# Patient Record
Sex: Female | Born: 1940 | Race: White | Hispanic: No | Smoking: Never smoker
Health system: Southern US, Community
[De-identification: ages and names within clinical notes are randomized; demographics above are authoritative.]

## PROBLEM LIST (undated history)

## (undated) DIAGNOSIS — N63 Unspecified lump in unspecified breast: Secondary | ICD-10-CM

## (undated) DIAGNOSIS — G4730 Sleep apnea, unspecified: Secondary | ICD-10-CM

## (undated) DIAGNOSIS — Z5189 Encounter for other specified aftercare: Secondary | ICD-10-CM

## (undated) DIAGNOSIS — H269 Unspecified cataract: Secondary | ICD-10-CM

## (undated) DIAGNOSIS — E785 Hyperlipidemia, unspecified: Secondary | ICD-10-CM

## (undated) DIAGNOSIS — A389 Scarlet fever, uncomplicated: Secondary | ICD-10-CM

## (undated) DIAGNOSIS — Z79890 Hormone replacement therapy (postmenopausal): Secondary | ICD-10-CM

## (undated) DIAGNOSIS — T7840XA Allergy, unspecified, initial encounter: Secondary | ICD-10-CM

## (undated) DIAGNOSIS — C50919 Malignant neoplasm of unspecified site of unspecified female breast: Secondary | ICD-10-CM

## (undated) DIAGNOSIS — K759 Inflammatory liver disease, unspecified: Secondary | ICD-10-CM

## (undated) HISTORY — DX: Malignant neoplasm of unspecified site of unspecified female breast: C50.919

## (undated) HISTORY — PX: OTHER SURGICAL HISTORY: SHX169

## (undated) HISTORY — PX: POLYPECTOMY: SHX149

## (undated) HISTORY — PX: BUNIONECTOMY: SHX129

## (undated) HISTORY — DX: Hyperlipidemia, unspecified: E78.5

## (undated) HISTORY — DX: Encounter for other specified aftercare: Z51.89

## (undated) HISTORY — PX: TONSILLECTOMY: SHX5217

## (undated) HISTORY — PX: TONSILLECTOMY: SUR1361

## (undated) HISTORY — DX: Allergy, unspecified, initial encounter: T78.40XA

## (undated) HISTORY — DX: Scarlet fever, uncomplicated: A38.9

## (undated) HISTORY — DX: Sleep apnea, unspecified: G47.30

## (undated) HISTORY — DX: Unspecified lump in unspecified breast: N63.0

## (undated) HISTORY — PX: APPENDECTOMY: SHX54

## (undated) HISTORY — DX: Inflammatory liver disease, unspecified: K75.9

## (undated) HISTORY — DX: Unspecified cataract: H26.9

## (undated) HISTORY — PX: COLONOSCOPY: SHX174

## (undated) HISTORY — PX: CORRECTION HAMMER TOE: SUR317

## (undated) HISTORY — DX: Hormone replacement therapy: Z79.890

## (undated) HISTORY — PX: FOOT TENDON SURGERY: SHX958

---

## 1997-09-07 ENCOUNTER — Other Ambulatory Visit: Admission: RE | Admit: 1997-09-07 | Discharge: 1997-09-07 | Payer: Self-pay | Admitting: Gynecology

## 1998-10-08 ENCOUNTER — Other Ambulatory Visit: Admission: RE | Admit: 1998-10-08 | Discharge: 1998-10-08 | Payer: Self-pay | Admitting: Gynecology

## 1999-01-03 ENCOUNTER — Encounter (INDEPENDENT_AMBULATORY_CARE_PROVIDER_SITE_OTHER): Payer: Self-pay

## 1999-01-03 ENCOUNTER — Other Ambulatory Visit: Admission: RE | Admit: 1999-01-03 | Discharge: 1999-01-03 | Payer: Self-pay | Admitting: Gynecology

## 1999-08-31 ENCOUNTER — Other Ambulatory Visit: Admission: RE | Admit: 1999-08-31 | Discharge: 1999-08-31 | Payer: Self-pay | Admitting: Gynecology

## 2000-09-05 ENCOUNTER — Other Ambulatory Visit: Admission: RE | Admit: 2000-09-05 | Discharge: 2000-09-05 | Payer: Self-pay | Admitting: Gynecology

## 2001-12-26 ENCOUNTER — Other Ambulatory Visit: Admission: RE | Admit: 2001-12-26 | Discharge: 2001-12-26 | Payer: Self-pay | Admitting: Gynecology

## 2002-11-04 ENCOUNTER — Ambulatory Visit (HOSPITAL_BASED_OUTPATIENT_CLINIC_OR_DEPARTMENT_OTHER): Admission: RE | Admit: 2002-11-04 | Discharge: 2002-11-05 | Payer: Self-pay | Admitting: Orthopedic Surgery

## 2002-11-04 ENCOUNTER — Encounter (INDEPENDENT_AMBULATORY_CARE_PROVIDER_SITE_OTHER): Payer: Self-pay | Admitting: Specialist

## 2003-01-05 ENCOUNTER — Other Ambulatory Visit: Admission: RE | Admit: 2003-01-05 | Discharge: 2003-01-05 | Payer: Self-pay | Admitting: Gynecology

## 2004-01-20 ENCOUNTER — Other Ambulatory Visit: Admission: RE | Admit: 2004-01-20 | Discharge: 2004-01-20 | Payer: Self-pay | Admitting: Gynecology

## 2004-03-28 ENCOUNTER — Ambulatory Visit: Payer: Self-pay | Admitting: Internal Medicine

## 2004-04-12 ENCOUNTER — Ambulatory Visit: Payer: Self-pay | Admitting: Internal Medicine

## 2005-03-27 ENCOUNTER — Other Ambulatory Visit: Admission: RE | Admit: 2005-03-27 | Discharge: 2005-03-27 | Payer: Self-pay | Admitting: Gynecology

## 2005-04-10 ENCOUNTER — Ambulatory Visit: Payer: Self-pay | Admitting: Internal Medicine

## 2005-04-11 ENCOUNTER — Ambulatory Visit: Payer: Self-pay | Admitting: Internal Medicine

## 2005-04-15 ENCOUNTER — Ambulatory Visit: Payer: Self-pay | Admitting: Internal Medicine

## 2005-04-18 ENCOUNTER — Ambulatory Visit: Payer: Self-pay | Admitting: Internal Medicine

## 2005-06-15 ENCOUNTER — Ambulatory Visit: Payer: Self-pay | Admitting: Internal Medicine

## 2005-08-25 ENCOUNTER — Ambulatory Visit: Payer: Self-pay | Admitting: Internal Medicine

## 2005-10-05 ENCOUNTER — Ambulatory Visit: Payer: Self-pay | Admitting: Endocrinology

## 2006-03-13 ENCOUNTER — Other Ambulatory Visit: Admission: RE | Admit: 2006-03-13 | Discharge: 2006-03-13 | Payer: Self-pay | Admitting: Gynecology

## 2006-03-14 ENCOUNTER — Ambulatory Visit (HOSPITAL_COMMUNITY): Admission: RE | Admit: 2006-03-14 | Discharge: 2006-03-14 | Payer: Self-pay | Admitting: Endocrinology

## 2006-03-14 ENCOUNTER — Ambulatory Visit: Payer: Self-pay | Admitting: Endocrinology

## 2006-05-15 LAB — HM MAMMOGRAPHY

## 2006-05-21 ENCOUNTER — Ambulatory Visit: Payer: Self-pay | Admitting: Internal Medicine

## 2006-05-21 LAB — CONVERTED CEMR LAB
ALT: 22 units/L (ref 0–40)
AST: 19 units/L (ref 0–37)
Albumin: 4 g/dL (ref 3.5–5.2)
Alkaline Phosphatase: 47 units/L (ref 39–117)
Basophils Relative: 0.8 % (ref 0.0–1.0)
Chloride: 103 meq/L (ref 96–112)
Chol/HDL Ratio, serum: 5.2
GFR calc non Af Amer: 77 mL/min
Glomerular Filtration Rate, Af Am: 93 mL/min/{1.73_m2}
Glucose, Bld: 109 mg/dL — ABNORMAL HIGH (ref 70–99)
HDL: 39.5 mg/dL (ref >39.0)
LDL DIRECT: 121.1 mg/dL
MCV: 87.9 fL (ref 78.0–100.0)
Monocytes Absolute: 0.5 10*3/uL (ref 0.2–0.7)
Mucus, UA: NEGATIVE
Platelets: 415 10*3/uL — ABNORMAL HIGH (ref 150–400)
RBC: 4.7 M/uL (ref 3.87–5.11)
RDW: 11.8 % (ref 11.5–14.6)
Sodium: 141 meq/L (ref 135–145)
Specific Gravity, Urine: 1.02 (ref 1.000–1.03)
Total Bilirubin: 0.9 mg/dL (ref 0.3–1.2)
Total Protein, Urine: NEGATIVE mg/dL
Total Protein: 7.2 g/dL (ref 6.0–8.3)
Triglyceride fasting, serum: 278 mg/dL (ref 0–149)
WBC: 5.6 10*3/uL (ref 4.5–10.5)
pH: 7 (ref 5.0–8.0)

## 2007-02-13 ENCOUNTER — Ambulatory Visit: Payer: Self-pay | Admitting: Internal Medicine

## 2007-02-26 ENCOUNTER — Ambulatory Visit (HOSPITAL_COMMUNITY): Admission: RE | Admit: 2007-02-26 | Discharge: 2007-02-26 | Payer: Self-pay | Admitting: Internal Medicine

## 2007-02-26 ENCOUNTER — Ambulatory Visit: Payer: Self-pay | Admitting: Internal Medicine

## 2007-05-16 LAB — CONVERTED CEMR LAB

## 2007-06-10 ENCOUNTER — Encounter: Payer: Self-pay | Admitting: Internal Medicine

## 2007-06-10 DIAGNOSIS — Z9189 Other specified personal risk factors, not elsewhere classified: Secondary | ICD-10-CM | POA: Insufficient documentation

## 2007-06-10 DIAGNOSIS — E785 Hyperlipidemia, unspecified: Secondary | ICD-10-CM | POA: Insufficient documentation

## 2007-06-10 DIAGNOSIS — D126 Benign neoplasm of colon, unspecified: Secondary | ICD-10-CM | POA: Insufficient documentation

## 2007-09-04 ENCOUNTER — Ambulatory Visit: Payer: Self-pay | Admitting: Internal Medicine

## 2007-09-04 LAB — CONVERTED CEMR LAB
Albumin: 4 g/dL (ref 3.5–5.2)
Alkaline Phosphatase: 44 units/L (ref 39–117)
BUN: 15 mg/dL (ref 6–23)
Basophils Relative: 0.3 % (ref 0.0–1.0)
Bilirubin Urine: NEGATIVE
Calcium: 9.8 mg/dL (ref 8.4–10.5)
Crystals: NEGATIVE
Eosinophils Relative: 2 % (ref 0.0–5.0)
GFR calc Af Amer: 81 mL/min
Glucose, Bld: 114 mg/dL — ABNORMAL HIGH (ref 70–99)
HCT: 41.1 % (ref 36.0–46.0)
Hemoglobin: 14 g/dL (ref 12.0–15.0)
Monocytes Absolute: 0.5 10*3/uL (ref 0.1–1.0)
Monocytes Relative: 9.3 % (ref 3.0–12.0)
Neutro Abs: 3.1 10*3/uL (ref 1.4–7.7)
Nitrite: NEGATIVE
Potassium: 4.5 meq/L (ref 3.5–5.1)
Total Protein, Urine: NEGATIVE mg/dL
Total Protein: 7.2 g/dL (ref 6.0–8.3)
Triglycerides: 259 mg/dL (ref 0–149)
WBC: 5.4 10*3/uL (ref 4.5–10.5)
pH: 6.5 (ref 5.0–8.0)

## 2007-09-11 ENCOUNTER — Ambulatory Visit: Payer: Self-pay | Admitting: Internal Medicine

## 2007-09-11 DIAGNOSIS — E669 Obesity, unspecified: Secondary | ICD-10-CM

## 2007-09-11 HISTORY — DX: Obesity, unspecified: E66.9

## 2008-01-31 ENCOUNTER — Encounter: Payer: Self-pay | Admitting: Internal Medicine

## 2008-02-20 ENCOUNTER — Ambulatory Visit: Payer: Self-pay | Admitting: Internal Medicine

## 2008-03-26 ENCOUNTER — Encounter: Payer: Self-pay | Admitting: Internal Medicine

## 2008-04-29 ENCOUNTER — Encounter: Payer: Self-pay | Admitting: Internal Medicine

## 2008-07-22 ENCOUNTER — Encounter: Payer: Self-pay | Admitting: Internal Medicine

## 2008-07-26 ENCOUNTER — Encounter: Payer: Self-pay | Admitting: Internal Medicine

## 2008-07-29 ENCOUNTER — Encounter: Payer: Self-pay | Admitting: Internal Medicine

## 2008-09-22 ENCOUNTER — Ambulatory Visit: Payer: Self-pay | Admitting: Internal Medicine

## 2008-09-24 ENCOUNTER — Ambulatory Visit: Payer: Self-pay | Admitting: Internal Medicine

## 2008-09-24 DIAGNOSIS — T887XXA Unspecified adverse effect of drug or medicament, initial encounter: Secondary | ICD-10-CM | POA: Insufficient documentation

## 2008-09-24 LAB — CONVERTED CEMR LAB
ALT: 19 units/L (ref 0–35)
AST: 17 units/L (ref 0–37)
Alkaline Phosphatase: 43 units/L (ref 39–117)
BUN: 15 mg/dL (ref 6–23)
Basophils Absolute: 0.1 10*3/uL (ref 0.0–0.1)
Bilirubin, Direct: 0.1 mg/dL (ref 0.0–0.3)
Chloride: 103 meq/L (ref 96–112)
Direct LDL: 130.3 mg/dL
Eosinophils Absolute: 0.1 10*3/uL (ref 0.0–0.7)
Lymphs Abs: 1.8 10*3/uL (ref 0.7–4.0)
MCHC: 34.7 g/dL (ref 30.0–36.0)
MCV: 87.6 fL (ref 78.0–100.0)
Monocytes Absolute: 0.6 10*3/uL (ref 0.1–1.0)
Neutrophils Relative %: 53.6 % (ref 43.0–77.0)
Platelets: 333 10*3/uL (ref 150.0–400.0)
Potassium: 4.1 meq/L (ref 3.5–5.1)
RDW: 12.2 % (ref 11.5–14.6)
Sodium: 140 meq/L (ref 135–145)
TSH: 1.66 microintl units/mL (ref 0.35–5.50)
Total Bilirubin: 0.8 mg/dL (ref 0.3–1.2)
VLDL: 54.2 mg/dL — ABNORMAL HIGH (ref 0.0–40.0)
WBC: 5.6 10*3/uL (ref 4.5–10.5)

## 2008-10-12 ENCOUNTER — Encounter: Payer: Self-pay | Admitting: Internal Medicine

## 2009-03-30 ENCOUNTER — Encounter: Payer: Self-pay | Admitting: Internal Medicine

## 2009-04-02 ENCOUNTER — Ambulatory Visit: Payer: Self-pay | Admitting: Internal Medicine

## 2009-06-15 ENCOUNTER — Encounter: Payer: Self-pay | Admitting: Internal Medicine

## 2009-08-10 ENCOUNTER — Telehealth: Payer: Self-pay | Admitting: Internal Medicine

## 2009-09-23 ENCOUNTER — Ambulatory Visit: Payer: Self-pay | Admitting: Internal Medicine

## 2009-09-23 LAB — CONVERTED CEMR LAB
ALT: 23 units/L (ref 0–35)
Alkaline Phosphatase: 50 units/L (ref 39–117)
BUN: 20 mg/dL (ref 6–23)
Basophils Absolute: 0 10*3/uL (ref 0.0–0.1)
Bilirubin, Direct: 0.1 mg/dL (ref 0.0–0.3)
Chloride: 101 meq/L (ref 96–112)
Creatinine, Ser: 0.7 mg/dL (ref 0.4–1.2)
Direct LDL: 150.4 mg/dL
Eosinophils Relative: 1.9 % (ref 0.0–5.0)
GFR calc non Af Amer: 93.3 mL/min (ref >60)
Glucose, Bld: 86 mg/dL (ref 70–99)
HCT: 40.2 % (ref 36.0–46.0)
Lymphs Abs: 2.4 10*3/uL (ref 0.7–4.0)
MCV: 87.9 fL (ref 78.0–100.0)
Monocytes Absolute: 0.7 10*3/uL (ref 0.1–1.0)
Neutrophils Relative %: 52.6 % (ref 43.0–77.0)
Platelets: 370 10*3/uL (ref 150.0–400.0)
RDW: 12.6 % (ref 11.5–14.6)
TSH: 0.97 microintl units/mL (ref 0.35–5.50)
Total Bilirubin: 0.6 mg/dL (ref 0.3–1.2)
Total Protein: 7.3 g/dL (ref 6.0–8.3)
WBC: 6.9 10*3/uL (ref 4.5–10.5)

## 2009-10-01 ENCOUNTER — Telehealth: Payer: Self-pay | Admitting: Internal Medicine

## 2010-03-31 ENCOUNTER — Encounter: Payer: Self-pay | Admitting: Internal Medicine

## 2010-05-26 ENCOUNTER — Telehealth: Payer: Self-pay | Admitting: Internal Medicine

## 2010-05-27 ENCOUNTER — Ambulatory Visit
Admission: RE | Admit: 2010-05-27 | Discharge: 2010-05-27 | Payer: Self-pay | Source: Home / Self Care | Attending: Internal Medicine | Admitting: Internal Medicine

## 2010-06-16 ENCOUNTER — Encounter: Payer: Self-pay | Admitting: Internal Medicine

## 2010-06-16 NOTE — Progress Notes (Signed)
Summary: ZOSTAVAX  Phone Note Call from Patient Call back at [phone]   Summary of Call: Pt left vm req rx's for pneumovax and zostavax. Will have manager check coverage of zostavax.  Initial call taken by: Lamar Sprinkles, CMA,  May 26, 2010 12:24 PM  Follow-up for Phone Call        Pt's cost is 40$, she is comming in today for shingles & pneumonia vaccines Follow-up by: Lamar Sprinkles, CMA,  May 27, 2010 2:24 PM

## 2010-06-16 NOTE — Assessment & Plan Note (Signed)
Summary: CPX / MEDICARE / # / CD   Vital Signs:  Patient profile:   70 year old female Height:      67 inches Weight:      207 pounds BMI:     32.54 O2 Sat:      94 % on Room air Temp:     97.7 degrees F oral Pulse rate:   67 / minute BP sitting:   100 / 78  (left arm) Cuff size:   large  Vitals Entered By: Bill Salinas CMA (Sep 25, 2009 2:54 PM)  O2 Flow:  Room air CC: pt here for cpx, she has never had pneumonia or shingles vaccine/ ab  Vision Screening:      Vision Comments: Pt's last eye exam was about oct 2010 with normal exam.   Vision Entered By: Bill Salinas CMA (09/25/09 2:56 PM)   Primary Care Provider:  Jamise Pentland  CC:  pt here for cpx and she has never had pneumonia or shingles vaccine/ ab.  History of Present Illness: Patient presents for routine medical exam.  For the past 9 months- she will have sore and stiff PIP joints both hands x 15-20 minutes in AM.  S/P operative repair/tendon reconstruction left lateral ankle. With active weight bearing activity she will have pain and pain. It is oK to exercise but start slow and build up   New growth on the lateral thigh right.   Broken/split nails.   Question about sleep and TV.   Current Medications (verified): 1)  Lipitor 20 Mg  Tabs (Atorvastatin Calcium) .... Take 1 Tablet By Mouth Once A Day 2)  Vivelle-Dot 0.025 Mg/24hr  Pttw (Estradiol) .... Take 1 By Mouth Qd 3)  Calcium 600 Mg  Tabs (Calcium) .... Take 1 Tablet By Mouth Once A Day 4)  Multivitamins   Tabs (Multiple Vitamin) .... Take 1 Tablet By Mouth Once A Day 5)  Prometrium .... Every Four Months For 12 Days 6)  Vitamin D 26-Sep-1998 Unit Tabs (Cholecalciferol) .... Take 1 Tablet By Mouth Once A Day  Allergies (verified): 1)  ! 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  Risk Factors: Alcohol Use: 1 (09/22/2008) Caffeine Use: 2 (09/11/2007) Exercise: no (09/22/2008)  Risk Factors: Smoking Status: never (09/22/2008)  Review of Systems       The patient complains of weight gain.  The patient denies anorexia, fever, weight loss, vision loss, decreased hearing, chest pain, syncope, dyspnea on exertion, prolonged cough, headaches, abdominal pain, severe indigestion/heartburn, incontinence, suspicious skin lesions, difficulty walking, depression, abnormal bleeding, angioedema, and breast masses.    Physical Exam  General:  Heavy set white female in no distress Head:  Normocephalic and atraumatic without obvious abnormalities. No apparent alopecia or balding. Eyes:  No corneal or conjunctival inflammation noted. EOMI. Perrla. Funduscopic exam benign, without hemorrhages, exudates or papilledema. Vision grossly normal. Ears:  External ear exam shows no significant lesions or deformities.  Otoscopic examination reveals clear canals, tympanic membranes  are intact bilaterally without bulging, retraction, inflammation or discharge. Hearing is grossly normal bilaterally. Nose:  no external deformity and no external erythema.   Mouth:  Oral mucosa and oropharynx without lesions or exudates.  Teeth in good repair. Neck:  supple, full ROM, no thyromegaly, and no carotid bruits.   Chest Wall:  no deformities.   Breasts:  deferred to gyn Lungs:  Normal respiratory effort, chest expands  symmetrically. Lungs are clear to auscultation, no crackles or wheezes. Heart:  Normal rate and regular rhythm. S1 and S2 normal without gallop, murmur, click, rub or other extra sounds. Abdomen:  soft, non-tender, normal bowel sounds, no masses, no guarding, and no hepatomegaly.   Genitalia:  deferred to gyn Msk:  normal ROM, no joint tenderness, no joint warmth, no redness over joints, and no joint instability.   Pulses:  2+ radial and PT Extremities:  No clubbing, cyanosis, edema, or deformity noted with normal full range of motion of all joints.   Neurologic:  alert & oriented X3, cranial nerves II-XII intact, strength normal in all extremities, sensation intact to pinprick, gait normal, and DTRs symmetrical and normal.   Skin:  turgor normal, color normal, no rashes, and no ulcerations.   Cervical Nodes:  no anterior cervical adenopathy and no posterior cervical adenopathy.   Psych:  Oriented X3, memory intact for recent and remote, normally interactive, good eye contact, and not anxious appearing.     Impression & Recommendations:  Problem # 1:  OVERWEIGHT (ICD-278.02) Patient wil be working with weight watchers on weight management. she is encouraged to increase exercise and water exercise is recommended  Problem # 2:  HYPERLIPIDEMIA (ICD-272.4)  Her updated medication list for this problem includes:    Lipitor 40 Mg Tabs (Atorvastatin calcium) .Marland Kitchen... 1 by mouth two times a day  Orders: TLB-Lipid Panel (80061-LIPID) TLB-Hepatic/Liver Function Pnl (80076-HEPATIC) TLB-TSH (Thyroid Stimulating Hormone) (84443-TSH)  Addendum - LDL 150.4 with a goal of 130 or less.  Plan - will increase Lipitor to 40mg  once daily.           With 20 lb weight loss will readjust lipitor dose.  Problem # 3:  Preventive Health Care (ICD-V70.0) Current with Gyn. Normal history as outlined in HPI. Normal limited exam. Labs except for elevated cholesterol levels look fine. Patient remains active and has a  plan for weight loss and increased exercise.  In summary - a very nice woman who appears medically stable.   Complete Medication List: 1)  Lipitor 40 Mg Tabs (Atorvastatin calcium) .Marland Kitchen.. 1 by mouth two times a day 2)  Vivelle-dot 0.025 Mg/24hr Pttw (Estradiol) .... Take 1 by mouth qd 3)  Calcium 600 Mg Tabs (Calcium) .... Take 1 tablet by mouth once a day 4)  Multivitamins Tabs (Multiple vitamin) .... Take 1 tablet by mouth once a day 5)  Prometrium  .... Every four months for 12 days 6)  Vitamin D 2000 Unit Tabs (Cholecalciferol) .... Take 1 tablet by mouth once a day  Other Orders: TLB-BMP (Basic Metabolic Panel-BMET) (80048-METABOL) TLB-CBC Platelet - w/Differential (85025-CBCD)  Patient: Kelly [name] Note: All result statuses are Final unless otherwise noted.  Tests: (1) Lipid Panel (LIPID)   Cholesterol          [H]  249 mg/dL                   0-454     ATP III Classification  Desirable:  < 200 mg/dL                    Borderline High:  200 - 239 mg/dL               High:  > = 240 mg/dL   Triglycerides        [H]  448.0 mg/dL                 1.6-109.6     Normal:  <150 mg/dL     Borderline High:  045 - 199 mg/dL   HDL                       40.98 mg/dL                 >11.91   VLDL Cholesterol     [H]  89.6 mg/dL                  4.7-82.9  CHO/HDL Ratio:  CHD Risk                             6                    Men          Women     1/2 Average Risk     3.4          3.3     Average Risk          5.0          4.4     2X Average Risk          9.6          7.1     3X Average Risk          15.0          11.0                           Tests: (2) Hepatic/Liver Function Panel (HEPATIC)   Total Bilirubin           0.6 mg/dL                   5.6-2.1   Direct Bilirubin          0.1 mg/dL                   3.0-8.6   Alkaline Phosphatase      50 U/L                      39-117   AST                       20 U/L                      0-37   ALT                        23 U/L                      0-35   Total Protein             7.3 g/dL  6.0-8.3   Albumin                   4.3 g/dL                    1.6-1.0  Tests: (3) BMP (METABOL)   Sodium                    141 mEq/L                   135-145   Potassium                 4.4 mEq/L                   3.5-5.1   Chloride                  101 mEq/L                   96-112   Carbon Dioxide            30 mEq/L                    19-32   Glucose                   86 mg/dL                    96-04   BUN                       20 mg/dL                    5-40   Creatinine                0.7 mg/dL                   9.8-1.1   Calcium                   9.6 mg/dL                   9.1-47.8   GFR                       93.30 mL/min                >60  Tests: (4) CBC Platelet w/Diff (CBCD)   White Cell Count          6.9 K/uL                    4.5-10.5   Red Cell Count            4.58 Mil/uL                 3.87-5.11   Hemoglobin                14.0 g/dL                   29.5-62.1   Hematocrit                40.2 %                      36.0-46.0   MCV  87.9 fl                     78.0-100.0   MCHC                      34.8 g/dL                   16.1-09.6   RDW                       12.6 %                      11.5-14.6   Platelet Count            370.0 K/uL                  150.0-400.0   Neutrophil %              52.6 %                      43.0-77.0   Lymphocyte %              35.2 %                      12.0-46.0   Monocyte %                9.6 %                       3.0-12.0   Eosinophils%              1.9 %                       0.0-5.0   Basophils %               0.7 %                       0.0-3.0   Neutrophill Absolute      3.6 K/uL                    1.4-7.7   Lymphocyte Absolute       2.4 K/uL                    0.7-4.0   Monocyte Absolute         0.7 K/uL                    0.1-1.0  Eosinophils, Absolute                             0.1 K/uL                     0.0-0.7   Basophils Absolute        0.0 K/uL                    0.0-0.1  Tests: (5) TSH (TSH)   FastTSH                   0.97 uIU/mL                 0.35-5.50  Tests: (6) Cholesterol LDL - Direct (DIRLDL)  Cholesterol LDL - Direct                             150.4 mg/dL     Optimal:  <956 mg/dL     Near or Above Optimal:  100-129 mg/dL     Borderline High:  213-086 mg/dL     High:  578-469 mg/dL     Very High:  >629 mg/dL Prescriptions: LIPITOR 40 MG TABS (ATORVASTATIN CALCIUM) 1 by mouth two times a day  #30 x 12   Entered and Authorized by:   Jacques Navy MD   Signed by:   Jacques Navy MD on 09/24/2009   Method used:   Print then Give to Patient   RxID:   [id number] LIPITOR 20 MG  TABS (ATORVASTATIN CALCIUM) Take 1 tablet by mouth once a day  #30 Tablet x 12   Entered and Authorized by:   Jacques Navy MD   Signed by:   Jacques Navy MD on 09/23/2009   Method used:   Print then Give to Patient   RxID:   [id number]

## 2010-06-16 NOTE — Assessment & Plan Note (Signed)
Summary: apt at 4pm/pay 40$/shingles and pneumovax/SD   Nurse Visit  Comments RAN Zostavax thru [facility] - Cost to pt was 40$ - pt paid ....Marland KitchenMarland KitchenLamar Sprinkles, CMA  May 31, 2010 5:44 PM    Allergies: 1)  ! Codeine  Immunizations Administered:  Zostavax # 1:    Vaccine Type: Zostavax    Site: Left ARM    Mfr: Merck    Dose: 0.5 ml    Route: IM    Given by: Lamar Sprinkles, CMA    Exp. Date: 0/18/2012    Lot #: [id number]    VIS given: 02/24/05 given May 31, 2010.  Pneumonia Vaccine:    Vaccine Type: Pneumovax    Site: right deltoid    Mfr: Merck    Dose: 0.5 ml    Route: IM    Given by: Lamar Sprinkles, CMA    Exp. Date: 10/07/2011    Lot #: [id number]    VIS given: 04/19/09 version given May 31, 2010.  Orders Added: 1)  Pneumococcal Vaccine [90732] 2)  Admin of Any Addtl Vaccine [04540]

## 2010-06-16 NOTE — Progress Notes (Signed)
Summary: Lipitor  Phone Note Outgoing Call Call back at [phone]   Call placed by: Ami Bullins CMA,  Oct 01, 2009 2:40 PM Call placed to: Patient Summary of Call: pt called in reg. to lipitor. Per last office visit pt was increased to 40mg  of lipitor. She should have rx Dr Debby Bud printed out. Waiting for pt to call back to clarify this with her. Initial call taken by: Ami Bullins CMA,  Oct 01, 2009 2:40 PM  Follow-up for Phone Call        Pt was previously on Lipitor 20mg  once daily. She was not fasting for last labs. Pt's rx was changed to lipitor 40mg  two times a day and she is concerned about signifigant increase. Please advise.  Follow-up by: Lamar Sprinkles, CMA,  Oct 06, 2009 11:00 AM  Additional Follow-up for Phone Call Additional follow up Details #1::        my error - should have been 40mg  once a day.  Thanks Additional Follow-up by: Jacques Navy MD,  Oct 07, 2009 1:11 PM    Additional Follow-up for Phone Call Additional follow up Details #2::    Left vm for pt on cell Follow-up by: Lamar Sprinkles, CMA,  Oct 07, 2009 3:30 PM  New/Updated Medications: LIPITOR 40 MG TABS (ATORVASTATIN CALCIUM) 1 once daily

## 2010-06-16 NOTE — Letter (Signed)
Summary: Beather Arbour MD  Beather Arbour MD   Imported By: Sherian Rein 04/13/2010 08:54:25  _____________________________________________________________________  External Attachment:    Type:   Image     Comment:   External Document

## 2010-06-16 NOTE — Progress Notes (Signed)
Summary: REFILL  Phone Note Call from Patient Call back at [phone]   Summary of Call: Patient is requesting rx for lipitor before cpx in May Initial call taken by: Lamar Sprinkles, CMA,  August 10, 2009 8:52 AM  Follow-up for Phone Call        Spoke w/pt, her insurance changed and will call office back with info about pharm Follow-up by: Lamar Sprinkles, CMA,  August 10, 2009 5:13 PM

## 2010-07-18 ENCOUNTER — Telehealth: Payer: Self-pay | Admitting: Internal Medicine

## 2010-07-21 ENCOUNTER — Telehealth (INDEPENDENT_AMBULATORY_CARE_PROVIDER_SITE_OTHER): Payer: Self-pay | Admitting: *Deleted

## 2010-07-22 ENCOUNTER — Other Ambulatory Visit: Payer: Self-pay

## 2010-07-22 ENCOUNTER — Encounter (INDEPENDENT_AMBULATORY_CARE_PROVIDER_SITE_OTHER): Payer: Self-pay | Admitting: *Deleted

## 2010-07-22 ENCOUNTER — Other Ambulatory Visit: Payer: Self-pay | Admitting: Internal Medicine

## 2010-07-22 DIAGNOSIS — E785 Hyperlipidemia, unspecified: Secondary | ICD-10-CM

## 2010-07-22 DIAGNOSIS — T887XXA Unspecified adverse effect of drug or medicament, initial encounter: Secondary | ICD-10-CM

## 2010-07-22 LAB — HEPATIC FUNCTION PANEL
ALT: 19 U/L (ref 0–35)
AST: 16 U/L (ref 0–37)
Alkaline Phosphatase: 39 U/L (ref 39–117)
Bilirubin, Direct: 0.1 mg/dL (ref 0.0–0.3)
Total Bilirubin: 0.7 mg/dL (ref 0.3–1.2)
Total Protein: 6.9 g/dL (ref 6.0–8.3)

## 2010-07-22 LAB — LIPID PANEL: HDL: 40.5 mg/dL (ref >39.00)

## 2010-07-24 ENCOUNTER — Encounter: Payer: Self-pay | Admitting: Internal Medicine

## 2010-07-26 NOTE — Progress Notes (Signed)
Summary: CHOLESTEROL   Phone Note Call from Patient Call back at [phone]   Summary of Call: Patient is requesting labs to recheck lipids. She says she has lost weight and does not feel she needs the high dose she is on now. Pt also has learned there is now a connection between cholesterol meds and dementia. OK for labs or does pt need office visit also?  Initial call taken by: Lamar Sprinkles, CMA,  July 18, 2010 2:48 PM  Follow-up for Phone Call        ok for lipid panel 272.4, hepatic panel 995.20. Can adjust meds based on labs. Connection to dementia is tenuous and is outweight by the risk of death from atherosclerosis and coronary artery disease.  Follow-up by: Jacques Navy MD,  July 18, 2010 2:57 PM  Additional Follow-up for Phone Call Additional follow up Details #1::        left mess to call office back.............Marland KitchenLamar Sprinkles, CMA  July 19, 2010 5:55 PM   Pt informed, she will come in for labs Friday am Additional Follow-up by: Lamar Sprinkles, CMA,  July 20, 2010 11:26 AM

## 2010-07-26 NOTE — Progress Notes (Signed)
----   Converted from flag ---- ---- 07/20/2010 11:28 AM, Lamar Sprinkles, CMA wrote: lipid panel 272.4, hepatic panel 995.20  Please put labs in Friday AM  THANKS ------------------------------ Labs entered-- You are welcome

## 2010-07-29 ENCOUNTER — Telehealth: Payer: Self-pay | Admitting: Internal Medicine

## 2010-08-02 NOTE — Progress Notes (Signed)
Summary: Rx inquiry  Phone Note Call from Patient Call back at Home Phone [phone]   Caller: Patient [phone] Summary of Call: Pt inquiring about her Lipitor Rx. Pt states that Lipitor went generic (atorvastatin) in December 2011 according to [facility] and it is costing her a difference of ($4) for generic to ($40) for brand name. Pt states that [facility] faxed form for approval of generic - not received.?  LMOM (per HIPAA) to inform Pt that Rxs sent that are not written DAW (dispense as written) which is not often and have a generic form are dispensed as generic. Informed Pt to call back if she needs [place] to call a Rx for med in to her pharmacy. Initial call taken by: Burnard Leigh [facility]),  July 29, 2010 2:24 PM

## 2010-08-02 NOTE — Letter (Signed)
[facility] [address] [place], [place]  [address] Phone: [phone]      July 25, 2010   [facility] [address] [place], [place] [address]  RE:  LAB RESULTS  Dear  [name],  The following is an interpretation of your most recent lab tests.  Please take note of any instructions provided or changes to medications that have resulted from your lab work.  LIVER FUNCTION TESTS:  Good - no changes needed  Health professionals look at cholesterol as more involved than just the total cholesterol. We consider the level of LDL (bad) cholesterol, HDL (good), cholesterol, and Triglycerides (Grease) in the blood.  1. Your LDL should be under 100, and the HDL should be over 45, if you have any vascular disease such as heart attack, angina, stroke, TIA (mini stroke), claudication (pain in the legs when you walk due to poor circulation),  Abdominal Aortic Aneurysm (AAA), diabetes or prediabetes.  2. Your LDL should be under 130 if you have any two of the following:     a. Smoke or chew tobacco,     b. High blood pressure (if you are on medication or over 140/90 without medication),     c. Female gender,    d. HDL below 40,    e. A female relative (father, brother, or son), who have had any vascular event          as described in #1. above under the age of 70, or a female relative (mother,       sister, or daughter) who had an event as described above under age 26. (An HDL over 60 will subtract one risk factor from the total, so if you have two items in # 2 above, but an HDL over 60, you then fall into category # 3 below).  3. Your LDL should be under 160 if you have any one of the above.  Triglycerides should be under 200 with the ideal being under 150.  For diabetes or pre-diabetes, the ideal HgbA1C should be under 6.0%.  If you fall into any of the above categories, you should make a follow up appointment to discuss this with your physician.  LIPID PANEL:   Good - no changes needed Triglyceride: 212.0   Cholesterol: 194   LDL: DEL   HDL: 40.50   Chol/HDL%:  5    Excellent job of bringing cholesterol down. Continue preent dose of medications.  Please come see me if you have any questions about these lab results.   Sincerely Yours,    Jacques Navy MD  Patient: Kelly [name] Note: All result statuses are Final unless otherwise noted.  Tests: (1) Lipid Panel (LIPID)   Cholesterol               194 mg/dL                   3-086     ATP III Classification            Desirable:  < 200 mg/dL                    Borderline High:  200 - 239 mg/dL               High:  > = 240 mg/dL   Triglycerides        [H]  212.0 mg/dL  0.0-149.0     Normal:  <150 mg/dL     Borderline High:  409 - 199 mg/dL   HDL                       81.19 mg/dL                 >14.78   VLDL Cholesterol     [H]  42.4 mg/dL                  2.9-56.2  CHO/HDL Ratio:  CHD Risk                             5                    Men          Women     1/2 Average Risk     3.4          3.3     Average Risk          5.0          4.4     2X Average Risk          9.6          7.1     3X Average Risk          15.0          11.0                           Tests: (2) Hepatic/Liver Function Panel (HEPATIC)   Total Bilirubin           0.7 mg/dL                   1.3-0.8   Direct Bilirubin          0.1 mg/dL                   6.5-7.8   Alkaline Phosphatase      39 U/L                      39-117   AST                       16 U/L                      0-37   ALT                       19 U/L                      0-35   Total Protein             6.9 g/dL                    4.6-9.6   Albumin                   4.3 g/dL                    2.9-5.2  Tests: (3) Cholesterol LDL - Direct (DIRLDL)  Cholesterol LDL - Direct  112.6 mg/dL

## 2010-09-27 ENCOUNTER — Other Ambulatory Visit: Payer: Self-pay | Admitting: Internal Medicine

## 2010-09-30 NOTE — Op Note (Signed)
NAME:  STARASIA, SINKO                       ACCOUNT NO.:  [id number]   MEDICAL RECORD NO.:  [id number]                   PATIENT TYPE:  AMB   LOCATION:  DSC                                  FACILITY:  [facility]   PHYSICIAN:  Leonides Grills, M.D.                  DATE OF BIRTH:  1941-01-24   DATE OF PROCEDURE:  11/04/2002  DATE OF DISCHARGE:                                 OPERATIVE REPORT   PREOPERATIVE DIAGNOSIS:  Left peroneus brevis tear.  Left subluxing peroneal  tendon.  Benign deep soft tissue lesion of ankle.   POSTOPERATIVE DIAGNOSIS:  Left peroneus brevis tear.  Left subluxing  peroneal tendon.  Benign deep soft tissue lesion of ankle.  Left calcaneal  spur.   PROCEDURE:  Left excision deep benign soft tissue lesion of ankle.  Left  excision with local tenosynovectomy of peroneus brevis tendon.  Repair of  subluxing peroneal tendons with fibular osteotomy.  Excision calcaneal spur.  Left peroneus brevis to peroneus longus transfer.  Left peroneus longus to  peroneus brevis tenodesis.   ANESTHESIA:  General endotracheal tube.   SURGEON:  Leonides Grills, M.D.   ASSISTANT:  Lianne Cure, P.A.   ESTIMATED BLOOD LOSS:  Minimal.   TOURNIQUET TIME:  Approximately 1 hour and 15 minutes.   COMPLICATIONS:  None.   DISPOSITION:  Stable to the PR.   INDICATIONS FOR PROCEDURE:  This is a 70 year old female who has had  longstanding posterolateral ankle pain as well as a lesion that was  progressively getting larger over a long period of time on the anterolateral  aspect of her ankle. She was initially seen by Romana Juniper, M.D. 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  At this point, it was and she wished to have it  removed along with repair of her peroneus brevis tear as well as excision of  the synovitis in the area.  She was consented for the above procedure.  All  risks which include infection, nerve  or vessel injury, persistent pain,  worsening of pain, weakness, stiffness, recurrence of the soft tissue lesion  were all explained. Questions were encouraged and answered.   DESCRIPTION OF PROCEDURE:  The patient was brought to the operating room and  placed in the supine position. After adequate general endotracheal tube  anesthesia was administered as well as Ancef 1 gram IV piggyback, the  patient was then placed in a floppy lateral position with a bump placed  under the left ipsilateral hip and the left lower extremity was prepped and  draped in the usual sterile fashion over a proximally placed thigh  tourniquet.  The limb was gravity exsanguinated. The tourniquet was elevated  to 290 mmHg.  A curvilinear incision midline over the lateral malleolus  extending distally approximately 3 to 4 cm was then  made. Dissection was  carried down through skin. We then dissected anteriorly and encountered the  adipose lipoma type tissue. This was then meticulously removed with a pickup  and scissors and sent to pathology. It was about a golfball size and was  adipose tissue in consistency. Once this was removed, hemostasis was  obtained.  We then dissected posteriorly and encountered the peroneal  retinaculum. A longitudinal incision was then made 1 to 2 mm posterior to  the edge of the lateral malleolus. Once this was released, there was a large  amount of synovitis as well as effusion in this area. The tendons were then  debrided of the synovitis.  The peroneus brevis muscle belly extended all  the way to the tip of the lateral malleolus and needed to be debulked  proximally as well to about 4 to 5 cm proximal to this area.  There was a  large approximately 50% tear of the peroneus brevis tendon. Due to the fact  that it was also bulbous just distal to the lateral malleolus and once it  was placed back into its respective canal, it was deemed that this would be  not viable and would be a  source of pain if this was repaired and would most  likely cause a trigger-type phenomenon once the retinaculum was repaired.  This was then excised.  We also inspected the posterior aspect of the  lateral malleolus. It was convex in its surface. We then did an osteotomy  and burring to deepen the groove. Once the flap was elevated and deepening  of the groove was then performed, the bed posterior to the lateral malleolus  was then tamped into place. This had excellent repair and deepening of the  peroneal groove.  We then prepared the bed for the advancement of the  retinaculum on the posterolateral corner of the fibula and placed two 5.0 mm  absorbable corkscrew suture anchors with #2 Fiberwire.  Once this was placed  in a proper position, we then left the suture for later repair.  Once the  peroneus brevis tendon portion was excised, we then performed a proximally  peroneus brevis to longus transfer with #2 Fiberwire and then a peroneus  longus to brevis tenodesis distally with a #2 Fiberwire as well. This had  excellent repair.  We also found that there was an extremely large peroneal  tubercle off the lateral wall of the calcaneus. This was then removed with a  curved 1/4 inch osteotome and rounded off smooth with rongeur, ie, calcaneal  spur excision.  Once this was done, we then repaired the retinaculum with a  #2 Fiberwire using suture anchors. This had excellent repair. We then also  repaired the main portion of the retinaculum with 2-0 Fiberwire. A Glorious Peach was  placed in the canal to prevent inadvertant suturing of the tendon.  The  ankle was then ranged and had excellent excursion within the canal.  The  wound was copiously irrigated with normal saline.  Once this was done in  layers. Tourniquet was deflated and hemostasis was obtained.  Subcutaneous  was closed with 3-0 Vicryl, skin was closed with 4-0 nylon, a sterile  dressing was applied. The patient was stable to the  PR.  Leonides Grills, M.D.    PB/MEDQ  D:  11/04/2002  T:  11/04/2002  Job:  [id number]

## 2010-09-30 NOTE — Assessment & Plan Note (Signed)
[facility]                           PRIMARY CARE OFFICE NOTE   NAME:[name], Kelly MENARD                    MRN:          [id number]  DATE:05/21/2006                            DOB:          [birth year]    Kelly [name] is a 70 year old Caucasian woman who presents for annual  physical exam. She was last seen in the office March 14, 2006 by Dr.  Everardo All for ache and bruising of left arm and knee after a fall.  Fortunately she had no fractures. She did have x-rays of both wrists and  lumbar spine which were negative. The patient also in October had a  chest x-ray which showed no rib fractures and normal lungs. Other visits  this year in 2007 included sore throat in May of 2007. The patient was  seen again for sore throat August 25, 2005.   CHIEF COMPLAINT:  The patient reports she is feeling well and doing well  with no active complaints at this time.   PAST MEDICAL HISTORY:  Family history and social history are well  documented in my note March 19, 2005 which was reviewed with no  additions, corrections, or updates.   HEALTH MAINTENANCE:  The patient has seen Dr. Nicholas Lose for general physical  exam, pelvic and PAP smear with a note on the chart from March 13, 2006. She had a normal examination.   CURRENT MEDICATIONS:  1. Lipitor 20 mg daily.  2. Calcium daily.  3. Multivitamin.  4. Vitamin E.  5. Prometrium daily.  6. Vivelle patch.  7. Advil as needed.   REVIEW OF SYSTEMS:  Negative for any constitutional, cardiovascular,  respiratory, gastrointestinal, genitourinary, or musculoskeletal  problems.   INTERVAL SOCIAL HISTORY:  Patient remains single with no long term  relationship at this time. 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  She definitely is stressed by this and is considering her options in  career planning.   PHYSICAL EXAMINATION:  Temperature was 98.8,  blood pressure 112/73,  pulse 76, weight 207.  GENERAL APPEARANCE: This an overweight Caucasian woman in no acute  distress.  HEENT EXAM: Normocephalic, atraumatic. EACs and TM s were unremarkable.  Oropharynx with native dentition and good repair, no buckle or palate  lesions were noted, posterior pharynx was clear. Conjunctivae sclerae  was clear. PERRLA, EOMI.  Funduscopic exam with a hand held instrument  revealed normal disc margins with no vascular changes.  NECK: Supple without thyromegaly.  No lymphadenopathy was noted in the  supraclavicular regions, chest. No CVA tenderness.  LUNGS: Clear to auscultation and percussion.  CARDIOVASCULAR: 2+ radial pulse, no JVD, no carotid bruits. She had a  quiet precordium with a regular rate and rhythm without murmurs, rubs,  or gallops.  BREAST EXAM: Deferred to gynecology.  ABDOMEN: Soft, no guarding, or rebound. No organosplenomegaly was  appreciated but patient's girth hindered the exam.  PELVIC AND RECTAL EXAMS: Deferred to gynecology.  EXTREMITIES: Without clubbing, cyanosis, edema, or deformity.  NEUROLOGIC EXAM: Nonfocal.   CHART REVIEW:  Last colonoscopy from December 17, 2003 and was remarkable  for colon polyps in the ascending colon and sigmoid colon. The patient  is for recall in 2008. Dr. Johnn Hai note is reviewed. The patient's last  mammogram dates from May 01, 2006 was a normal study. Last EKG from  April 12, 2004 was a normal EKG.   LABORATORY DATA:  Hemoglobin was 13.9, grams white count was 5,600 with  a normal differential, platelet count 415,000. Chemistries were  unremarkable with a potassium of 4.2, creatinine is 0.8, glucose is 109.  Cholesterol panel was excellent with a total cholesterol of 206, HDL was  39.5, LDL 121, triglycerides are 278, thyroid function, normal with a  TSH of 1.41.   ASSESSMENT/PLAN:  Hyperlipidemia, the patient with adequate control.  Based on these values using a Framingham risk  calculator she has a 10  year 2% risk of a cardiac event.   PLAN:  1. The patient to continue her present medications.  2. Health maintenance, the patient is current and up to date with her      gynecologist. She is current and up to date with colorectal cancer      screening and due this summer for repeat study. She is current with      mammography. Did discuss with her weight management, have      encouraged her to have a calorie restricted diet with a goal of a      18 pound weight loss in the next 12 months with a target weight of      160 pounds eventually.   The patient is a very pleasant woman who seems to be medically stable at  this time. She is asked to return to see me 1 year on a p.r.n. basis.     Kelly Gess Norins, MD  Electronically Signed    MEN/MedQ  DD: 05/22/2006  DT: 05/22/2006  Job #: [id number]   cc:   Gretta Cool, M.D.

## 2010-11-08 ENCOUNTER — Encounter: Payer: Self-pay | Admitting: Internal Medicine

## 2010-11-09 ENCOUNTER — Encounter: Payer: Self-pay | Admitting: Internal Medicine

## 2010-11-10 ENCOUNTER — Ambulatory Visit (INDEPENDENT_AMBULATORY_CARE_PROVIDER_SITE_OTHER): Payer: Medicare Other | Admitting: Internal Medicine

## 2010-11-10 VITALS — BP 100/68 | HR 98 | Temp 98.3°F | Wt 192.0 lb

## 2010-11-10 DIAGNOSIS — Z0000 Encounter for general adult medical examination without abnormal findings: Secondary | ICD-10-CM

## 2010-11-10 DIAGNOSIS — E663 Overweight: Secondary | ICD-10-CM

## 2010-11-10 DIAGNOSIS — Z136 Encounter for screening for cardiovascular disorders: Secondary | ICD-10-CM

## 2010-11-10 DIAGNOSIS — T887XXA Unspecified adverse effect of drug or medicament, initial encounter: Secondary | ICD-10-CM

## 2010-11-10 DIAGNOSIS — E785 Hyperlipidemia, unspecified: Secondary | ICD-10-CM

## 2010-11-10 DIAGNOSIS — D126 Benign neoplasm of colon, unspecified: Secondary | ICD-10-CM

## 2010-11-10 NOTE — Progress Notes (Signed)
  Subjective:    Patient ID: Kelly [name], female    DOB: [birth year], 70 y.o.   MRN: [id number]  HPI    Review of Systems Review of Systems  Constitutional:  Negative for fever, chills, activity change and unexpected weight change.  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 Negative for  ear pain, congestion, neck stiffness and postnasal drip. Negative for sore throat or swallowing problems. Negative for dental complaints.   Eyes: Negative for vision loss or change in visual acuity.  Respiratory: Negative for chest tightness and wheezing.   Cardiovascular: Negative for chest pain and palpitation. No decreased exercise tolerance Gastrointestinal: No change in bowel habit. No bloating or gas. No reflux or indigestion Genitourinary: Negative for urgency, flank pain and difficulty urinating. Frequency with nocturia x 3, mild incontinence Musculoskeletal: Negative for myalgias, back pain, arthralgias and gait problem.  Neurological: Negative for dizziness, tremors, weakness and headaches.  Hematological: Negative for adenopathy.  Psychiatric/Behavioral: Negative for behavioral problems and dysphoric mood.       Objective:   Physical Exam        Assessment & Plan:

## 2010-11-10 NOTE — Progress Notes (Signed)
Subjective:    Patient ID: Kelly [name], female    DOB: November 05, 1940, 70 y.o.   MRN: [id number]  HPI  The patient is here for annual Medicare wellness examination and management of other chronic and acute problems. She has lost 15 lbs in the last 13 months - YEAH!!. She is feeling good.    The risk factors are reflected in the social history.  The roster of all physicians providing medical care to patient - is listed in the Snapshot section of the chart.  Activities of daily living:  The patient is 100% inedpendent in all ADLs: dressing, toileting, feeding as well as independent mobility  Home safety : The patient has smoke detectors in the home. They wear seatbelts .No firearms at home. There is no violence in the home.   There is no risks for hepatitis, STDs or HIV. There is history of blood transfusion. They have no travel history to infectious disease endemic areas of the world.  The patient has seen their dentist in the last six month. They have seen their eye doctor in the last year. They admit to any hearing difficulty and have had audiologic testing in the last year.  They do not  have excessive sun exposure. Discussed the need for sun protection: hats, long sleeves and use of sunscreen if there is significant sun exposure.   Diet: the importance of a healthy diet is discussed. They do have a healthy diet.  The patient has a regular exercise program: cardio, balance excercise , 1 hour duration, 3 times per week.  The benefits of regular aerobic exercise were discussed.  Depression screen: there are no signs or vegative symptoms of depression- irritability, change in appetite, anhedonia, sadness/tearfullness.  Cognitive assessment: the patient manages all their financial and personal affairs and is actively engaged. They could relate day,date,year and events; recalled 3/3 objects at 3 minutes; performed clock-face test normally.  The following portions of the patient's history  were reviewed and updated as appropriate: allergies, current medications, past family history, past medical history,  past surgical history, past social history  and problem list.  Vision, hearing, body mass index were assessed and reviewed.   During the course of the visit the patient was educated and counseled about appropriate screening and preventive services including : fall prevention , diabetes screening, nutrition counseling, colorectal cancer screening, and recommended immunizations.    Review of Systems Review of Systems  Constitutional:  Negative for fever, chills, activity change and unexpected weight change.  HEENT:  Negative for hearing loss, ear pain, congestion, neck stiffness and postnasal drip. Negative for sore throat or swallowing problems. Negative for dental complaints.   Eyes: Negative for vision loss or change in visual acuity.  Respiratory: Negative for chest tightness and wheezing.   Cardiovascular: Negative for chest pain and palpitation. No decreased exercise tolerance Gastrointestinal: No change in bowel habit. No bloating or gas. No reflux or indigestion Genitourinary: Negative for urgency, frequency, flank pain and difficulty urinating.  Musculoskeletal: Negative for myalgias, back pain, arthralgias and gait problem.  Neurological: Negative for dizziness, tremors, weakness and headaches.  Hematological: Negative for adenopathy.  Psychiatric/Behavioral: Negative for behavioral problems and dysphoric mood.       Objective:   Physical Exam Vitals reviewed. Gen'l: well nourished, well developed white woman in no distress HEENT - [place]/AT, EACs/TMs normal, oropharynx with native dentition in good condition, no buccal or palatal lesions, posterior pharynx clear, mucous membranes moist. C&S clear, PERRLA, fundi - normal Neck -  supple, no thyromegaly Nodes- negative submental, cervical, supraclavicular regions Chest - no deformity, no CVAT Lungs - cleat without  rales, wheezes. No increased work of breathing Breast - deferred Cardiovascular - regular rate and rhythm, quiet precordium, no murmurs, rubs or gallops, 2+ radial, DP and PT pulses Abdomen - BS+ x 4, no HSM, no guarding or rebound or tenderness Pelvic - deferred  Rectal - deferred  Extremities - no clubbing, cyanosis, edema or deformity.  Neuro - A&O x 3, CN II-XII normal, motor strength normal and equal, DTRs 2+ and symmetrical biceps, radial, and patellar tendons. Cerebellar - no tremor, no rigidity, fluid movement and normal gait. Derm - Head, neck, back, abdomen and extremities without suspicious lesions  Lab Results  Component Value Date   WBC 6.1 11/11/2010   HGB 13.8 11/11/2010   HCT 39.9 11/11/2010   PLT 332.0 11/11/2010   CHOL 163 11/11/2010   TRIG 126.0 11/11/2010   HDL 47.50 11/11/2010   LDLDIRECT 112.6 07/22/2010   ALT 15 11/11/2010   ALT 15 11/11/2010   AST 17 11/11/2010   AST 17 11/11/2010   NA 141 11/11/2010   K 3.9 11/11/2010   CL 104 11/11/2010   CREATININE 0.7 11/11/2010   BUN 16 11/11/2010   CO2 30 11/11/2010   TSH 1.16 11/11/2010           Assessment & Plan:

## 2010-11-11 ENCOUNTER — Other Ambulatory Visit (INDEPENDENT_AMBULATORY_CARE_PROVIDER_SITE_OTHER): Payer: Medicare Other

## 2010-11-11 DIAGNOSIS — E785 Hyperlipidemia, unspecified: Secondary | ICD-10-CM

## 2010-11-11 DIAGNOSIS — T887XXA Unspecified adverse effect of drug or medicament, initial encounter: Secondary | ICD-10-CM

## 2010-11-11 LAB — LIPID PANEL
HDL: 47.5 mg/dL (ref >39.00)
LDL Cholesterol: 90 mg/dL (ref 0–99)
Total CHOL/HDL Ratio: 3
Triglycerides: 126 mg/dL (ref 0.0–149.0)
VLDL: 25.2 mg/dL (ref 0.0–40.0)

## 2010-11-11 LAB — CBC WITH DIFFERENTIAL/PLATELET
Basophils Absolute: 0 10*3/uL (ref 0.0–0.1)
Eosinophils Absolute: 0.1 10*3/uL (ref 0.0–0.7)
Hemoglobin: 13.8 g/dL (ref 12.0–15.0)
Lymphocytes Relative: 31 % (ref 12.0–46.0)
Lymphs Abs: 1.9 10*3/uL (ref 0.7–4.0)
MCHC: 34.7 g/dL (ref 30.0–36.0)
Neutro Abs: 3.6 10*3/uL (ref 1.4–7.7)
Platelets: 332 10*3/uL (ref 150.0–400.0)
RDW: 12.6 % (ref 11.5–14.6)

## 2010-11-11 LAB — TSH: TSH: 1.16 u[IU]/mL (ref 0.35–5.50)

## 2010-11-11 LAB — COMPREHENSIVE METABOLIC PANEL
ALT: 15 U/L (ref 0–35)
AST: 17 U/L (ref 0–37)
CO2: 30 mEq/L (ref 19–32)
Calcium: 9.2 mg/dL (ref 8.4–10.5)
Chloride: 104 mEq/L (ref 96–112)
Creatinine, Ser: 0.7 mg/dL (ref 0.4–1.2)
GFR: 85.58 mL/min (ref >60.00)
Potassium: 3.9 mEq/L (ref 3.5–5.1)
Sodium: 141 mEq/L (ref 135–145)
Total Protein: 7.1 g/dL (ref 6.0–8.3)

## 2010-11-11 LAB — HEPATIC FUNCTION PANEL
AST: 17 U/L (ref 0–37)
Albumin: 4.3 g/dL (ref 3.5–5.2)
Total Bilirubin: 0.8 mg/dL (ref 0.3–1.2)

## 2010-11-12 DIAGNOSIS — Z0000 Encounter for general adult medical examination without abnormal findings: Secondary | ICD-10-CM | POA: Insufficient documentation

## 2010-11-12 NOTE — Assessment & Plan Note (Signed)
Doing a great job with the 3 principles: smart food choices, portion size control and exercise with a loss in 12 months of 15 lb, exceeding the goal of 1 lb per month.  Plan - continue the GOOD work.

## 2010-11-12 NOTE — Assessment & Plan Note (Signed)
Interval history is unremarkable except for planned weight loss. Physical exam is normal. Lab results are good with LDL at goal. Last mammogram Feb '12. Last colonoscopy as noted. Immunizations: Pneumonia vaccine Jan '12; Shingles vaccine Jan '12; due for Tdap. 12 lead EKG without signs of ischemia or injury.   Kelly [name] is encouraged to continue her weight management program; to develop a regular aerobic exercise program. In regard to relationships she is cautioned about safe practices. 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  In summary -a delightful woman who is medically stable. She will return in 1 year or as needed.

## 2010-11-12 NOTE — Assessment & Plan Note (Signed)
Last colonoscopy Oct '08 - due in '13.

## 2010-11-17 ENCOUNTER — Telehealth: Payer: Self-pay | Admitting: *Deleted

## 2010-11-17 DIAGNOSIS — E785 Hyperlipidemia, unspecified: Secondary | ICD-10-CM

## 2010-11-17 NOTE — Telephone Encounter (Signed)
Patient requesting a call regarding OV notes she received.

## 2010-11-18 NOTE — Telephone Encounter (Signed)
Spoke w/pt  1. She has lost 15 lbs since last June and wants to know if she can try to reduce Lipitor dose? 2. Discussed labs - labs in OV notes did not have reference ranges and LDL was left out.  3. Risk level was not included in these notes as they were last year - told her LDL ratio was a 3 (which is what she wanted to know) per pt, last year it was a 5.

## 2010-11-20 NOTE — Telephone Encounter (Signed)
Last LDL June '12 was 90 - definitely at goal. Last before that was 112. Can have a trial of lower dose lipitor, 40-to 20mg  daily, with repeat lab in 3 weeks - lipid panel (order entered).

## 2010-11-21 NOTE — Telephone Encounter (Signed)
Patient informed. 

## 2010-12-16 ENCOUNTER — Other Ambulatory Visit (INDEPENDENT_AMBULATORY_CARE_PROVIDER_SITE_OTHER): Payer: Medicare Other

## 2010-12-16 DIAGNOSIS — E785 Hyperlipidemia, unspecified: Secondary | ICD-10-CM

## 2010-12-16 LAB — LIPID PANEL: Cholesterol: 195 mg/dL (ref 0–200)

## 2010-12-19 ENCOUNTER — Encounter: Payer: Self-pay | Admitting: Internal Medicine

## 2011-04-03 ENCOUNTER — Other Ambulatory Visit: Payer: Self-pay | Admitting: Gynecology

## 2011-04-03 DIAGNOSIS — R922 Inconclusive mammogram: Secondary | ICD-10-CM

## 2011-04-12 ENCOUNTER — Other Ambulatory Visit: Payer: Self-pay | Admitting: Gynecology

## 2011-04-12 ENCOUNTER — Ambulatory Visit
Admission: RE | Admit: 2011-04-12 | Discharge: 2011-04-12 | Disposition: A | Discharge status: Home / Self Care | Payer: Medicare Other | Source: Ambulatory Visit | Attending: Gynecology | Admitting: Gynecology

## 2011-04-12 DIAGNOSIS — R922 Inconclusive mammogram: Secondary | ICD-10-CM

## 2011-06-06 ENCOUNTER — Ambulatory Visit (INDEPENDENT_AMBULATORY_CARE_PROVIDER_SITE_OTHER): Payer: Medicare Other | Admitting: Surgery

## 2011-06-06 ENCOUNTER — Encounter (INDEPENDENT_AMBULATORY_CARE_PROVIDER_SITE_OTHER): Payer: Self-pay | Admitting: Surgery

## 2011-06-06 VITALS — BP 116/70 | HR 80 | Temp 97.8°F | Resp 18 | Ht 67.0 in | Wt 187.0 lb

## 2011-06-06 DIAGNOSIS — N6019 Diffuse cystic mastopathy of unspecified breast: Secondary | ICD-10-CM

## 2011-06-06 HISTORY — DX: Diffuse cystic mastopathy of unspecified breast: N60.19

## 2011-06-06 NOTE — Progress Notes (Signed)
  CC: Question of a breast mass HPI: Dr. Nicholas Lose has asked [place] to see this patient because of a palpable asymmetric density in the upper outer quadrant of the left breast. The patient is not aware of a mass. She has no breast symptoms. She has had no prior breast problems. She has a negative family for breast cancer.   ROS: Essentially negative except as noted in the MR  MEDS: Current Outpatient Prescriptions  Medication Sig Dispense Refill  . atorvastatin (LIPITOR) 40 MG tablet 20 mg.       . Calcium Citrate-Vitamin D (CALCIUM CITRATE + PO) Take by mouth daily.      . Cholecalciferol (VITAMIN D) 2000 UNITS tablet Take 2,000 Units by mouth daily.        Marland Kitchen estradiol (ESTRACE) 0.1 MG/GM vaginal cream Place 2 g vaginally daily.      Marland Kitchen estradiol (VIVELLE-DOT) 0.025 MG/24HR Place 1 patch onto the skin.        . multivitamin (THERAGRAN) per tablet Take 1 tablet by mouth daily.        . NON FORMULARY Prometrium--every four months for 12 days.       . progesterone (PROMETRIUM) 200 MG capsule as directed.         ALLERGIES: Allergies  Allergen Reactions  . Codeine     REACTION: nausea       PE General: The patient is alert oriented and healthy-appearing, NAD Breasts: The breasts are symmetric in appearance. They are somewhat nodular. This is particularly noticeable in the upper outer quadrant of the left breast which the area noted by Dr. Nicholas Lose. However there is a dominant mass in this area and I do not believe this is malignant but instead more fibrotic irregularity. Lymphatics: There is no axillary or supraclavicular adenopathy.  Data Reviewed I have reviewed the notes from Dr. Nicholas Lose as well as radiology reports from her recent mammogram and ultrasound.  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  However I did not recommend biopsy at this time since I believe this area to be benign.

## 2011-08-16 ENCOUNTER — Encounter: Payer: Self-pay | Admitting: Internal Medicine

## 2011-10-23 ENCOUNTER — Other Ambulatory Visit: Payer: Self-pay | Admitting: Internal Medicine

## 2011-11-29 ENCOUNTER — Encounter: Payer: Self-pay | Admitting: Internal Medicine

## 2011-11-29 ENCOUNTER — Ambulatory Visit (INDEPENDENT_AMBULATORY_CARE_PROVIDER_SITE_OTHER): Payer: Medicare Other | Admitting: Internal Medicine

## 2011-11-29 VITALS — BP 98/70 | HR 77 | Temp 99.8°F | Resp 16 | Wt 186.0 lb

## 2011-11-29 DIAGNOSIS — E663 Overweight: Secondary | ICD-10-CM

## 2011-11-29 DIAGNOSIS — Z23 Encounter for immunization: Secondary | ICD-10-CM

## 2011-11-29 DIAGNOSIS — E785 Hyperlipidemia, unspecified: Secondary | ICD-10-CM

## 2011-11-29 DIAGNOSIS — Z0000 Encounter for general adult medical examination without abnormal findings: Secondary | ICD-10-CM

## 2011-11-29 MED ORDER — PROGESTERONE MICRONIZED 200 MG PO CAPS: 200.0000 mg | ORAL_CAPSULE | ORAL | Status: DC

## 2011-11-29 NOTE — Progress Notes (Signed)
Subjective:    Patient ID: Kelly [name], female    DOB: Apr 04, 1941, 71 y.o.   MRN: [id number]  HPI Kelly [name] is here for annual Medicare wellness examination and management of other chronic and acute problems.   The risk factors are reflected in the social history.  The roster of all physicians providing medical care to patient - is listed in the Snapshot section of the chart.  Activities of daily living:  The patient is 100% inedpendent in all ADLs: dressing, toileting, feeding as well as independent mobility  Home safety : The patient has smoke detectors in the home. They wear seatbelts. No firearms at homeThere is no violence in the home.   There is no risks for hepatitis, STDs or HIV. There is no   history of blood transfusion. They have no travel history to infectious disease endemic areas of the world.  The patient has seen their dentist in the last six month. They have seen their eye doctor in the last year. They admit to hearing difficulty and have had audiologic testing in the last 18 months.  They do not  have excessive sun exposure. Discussed the need for sun protection: hats, long sleeves and use of sunscreen if there is significant sun exposure.   Diet: the importance of a healthy diet is discussed. They do have a healthy diet.  The patient has a regular exercise program: gym workout and balance ,  60 min duration, 3 per week.  The benefits of regular aerobic exercise were discussed.  Depression screen: there are no signs or vegative symptoms of depression- irritability, change in appetite, anhedonia, sadness/tearfullness.  Cognitive assessment: the patient manages all their financial and personal affairs and is actively engaged.  The following portions of the patient's history were reviewed and updated as appropriate: allergies, current medications, past family history, past medical history,  past surgical history, past social history  and problem list.  Vision,  hearing, body mass index were assessed and reviewed.   During the course of the visit the patient was educated and counseled about appropriate screening and preventive services including : fall prevention , diabetes screening, nutrition counseling, colorectal cancer screening, and recommended immunizations.  Past Medical History  Diagnosis Date  . Hyperlipidemia   . Postmenopausal HRT (hormone replacement therapy)   . Hepatitis     CMV 1992  . Scarlet fever as a teen  . Breast mass    Past Surgical History  Procedure Date  . Appendectomy   . Tonsillectomy   . Caesarean section   . Bunionectomy   . Foot tendon surgery     left   . Correction hammer toe    Family History  Problem Relation Age of Onset  . Cancer Mother     pancreatic  . Hyperlipidemia Mother   . Hypertension Mother   . Polymyalgia rheumatica Mother    History   Social History  . Marital Status: Single    Spouse Name: N/A    Number of Children: 2  . Years of Education: 16   Occupational History  . 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     Social History Main Topics  . Smoking status: Never Smoker   . Smokeless tobacco: Never Used  . Alcohol Use: Yes     rarely will have a glass of wine  . Drug Use: No  . Sexually Active: Yes -- Female partner(s)   Other Topics Concern  . Not  on file   Social History Narrative   Kelly [name] Kelly [name]; Master's [employer]. Lives alone and independently. Married '67--24 yrs. - widowed '92.  2 dtrs - '69, '73; 6 grandchildren. ACP/EoL - yes CPR, yes for short term ventilation, no prolonged heroic care in an irreversible state.     Current Outpatient Prescriptions on File Prior to Visit  Medication Sig Dispense Refill  . Calcium Citrate-Vitamin D (CALCIUM CITRATE + PO) Take by mouth daily.      . Cholecalciferol (VITAMIN D) 2000 UNITS tablet Take 2,000 Units by mouth daily.        Marland Kitchen estradiol (ESTRACE) 0.1 MG/GM vaginal cream Place 2 g  vaginally daily.      . multivitamin (THERAGRAN) per tablet Take 1 tablet by mouth daily.        . NON FORMULARY Prometrium--every four months for 12 days.       Marland Kitchen DISCONTD: progesterone (PROMETRIUM) 200 MG capsule as directed.      Marland Kitchen DISCONTD: estradiol (VIVELLE-DOT) 0.025 MG/24HR Place 1 patch onto the skin.            Review of Systems Constitutional:  Negative for fever, chills, activity change and unexpected weight change.  HEENT:  Negative for hearing loss, ear pain, congestion, neck stiffness and postnasal drip. Negative for sore throat or swallowing problems. Negative for dental complaints.   Eyes: Negative for vision loss or change in visual acuity.  Respiratory: Negative for chest tightness and wheezing. Negative for DOE.   Cardiovascular: Negative for chest pain or palpitations. No decreased exercise tolerance Gastrointestinal: No change in bowel habit. No bloating or gas. No reflux or indigestion Genitourinary: Negative for urgency, frequency, flank pain and difficulty urinating. Some overflow incontinence. Musculoskeletal: Negative for myalgias, back pain, and gait problem. Minor arthralgias DIP, PIP joints Neurological: Negative for dizziness, tremors, weakness and headaches.  Hematological: Negative for adenopathy.  Psychiatric/Behavioral: Negative for behavioral problems and dysphoric mood.       Objective:   Physical Exam Filed Vitals:   11/29/11 1441  BP: 98/70  Pulse: 77  Temp: 99.8 F (37.7 C)  Resp: 16   Wt Readings from Last 3 Encounters:  11/29/11 186 lb (84.369 kg)  06/06/11 187 lb (84.823 kg)  11/10/10 192 lb (87.091 kg)  BMI 29 (nl 19-25) - overweight category  Gen'l: well nourished, well developed white woman in no distress HEENT - Kelly [name]/AT, EACs/TMs normal, oropharynx with native dentition in good condition, no buccal or palatal lesions, posterior pharynx clear, mucous membranes moist. C&S clear, PERRLA, fundi - normal Neck - supple, no  thyromegaly Nodes- negative submental, cervical, supraclavicular regions Chest - no deformity, no CVAT Lungs - clear without rales, wheezes. No increased work of breathing Breast - deferred. Has had recent normal mammogram. Cardiovascular - regular rate and rhythm, quiet precordium, no murmurs, rubs or gallops, 2+ radial, DP and PT pulses Abdomen - BS+ x 4, no HSM, no guarding or rebound or tenderness Pelvic - deferred to gyn Rectal - deferred to gyn Extremities - no clubbing, cyanosis, edema or deformity.  Neuro - A&O x 3, CN II-XII normal, motor strength normal and equal, DTRs 2+ and symmetrical biceps, radial, and patellar tendons. Cerebellar - no tremor, no rigidity, fluid movement and normal gait. Derm - Head, neck, back, abdomen and extremities without suspicious lesions  Lab Results  Component Value Date   WBC 6.1 11/11/2010   HGB 13.8 11/11/2010   HCT 39.9 11/11/2010   PLT 332.0 11/11/2010  GLUCOSE 99 11/11/2010   CHOL 195 12/16/2010   TRIG 162.0* 12/16/2010   HDL 48.70 12/16/2010   LDLDIRECT 112.6 07/22/2010   LDLCALC 114* 12/16/2010        ALT 15 11/11/2010   AST 17 11/11/2010        NA 141 11/11/2010   K 3.9 11/11/2010   CL 104 11/11/2010   CREATININE 0.7 11/11/2010   BUN 16 11/11/2010   CO2 30 11/11/2010   TSH 1.16 11/11/2010           Assessment & Plan:

## 2011-12-02 NOTE — Assessment & Plan Note (Signed)
Last lipids in '12 with LDL 112 - goal is 130 or less  Plan  Return for lipid panel with recommendations to follow.

## 2011-12-02 NOTE — Assessment & Plan Note (Signed)
Interval history is negative for any major illness or surgery. Physical exam, sans breast and pelvic, is normal. Lab are pending. She is current with both colon and breast cancer screening. Immunizations are up to date.  In summary - a nice woman who is medically stable and appears healthy. She will continue on a weight management program with a goal of loosing  1 lb / month. She will return as needed or in 1 year.

## 2011-12-02 NOTE — Assessment & Plan Note (Signed)
She has lost 21 lbs in 2 years: 207 Sep 23, 2009 to 186 today. She has done well with calories restriction.  Plan - continue the good work with a goal of loosing 12 lbs a year.

## 2011-12-04 ENCOUNTER — Other Ambulatory Visit (INDEPENDENT_AMBULATORY_CARE_PROVIDER_SITE_OTHER): Payer: Medicare Other

## 2011-12-04 DIAGNOSIS — E785 Hyperlipidemia, unspecified: Secondary | ICD-10-CM

## 2011-12-04 LAB — HEPATIC FUNCTION PANEL
AST: 19 U/L (ref 0–37)
Albumin: 4 g/dL (ref 3.5–5.2)
Alkaline Phosphatase: 37 U/L — ABNORMAL LOW (ref 39–117)
Total Protein: 6.9 g/dL (ref 6.0–8.3)

## 2011-12-04 LAB — COMPREHENSIVE METABOLIC PANEL
ALT: 15 U/L (ref 0–35)
AST: 19 U/L (ref 0–37)
Calcium: 9.3 mg/dL (ref 8.4–10.5)
Chloride: 105 mEq/L (ref 96–112)
Creatinine, Ser: 0.8 mg/dL (ref 0.4–1.2)
Sodium: 140 mEq/L (ref 135–145)

## 2011-12-04 LAB — LIPID PANEL
Cholesterol: 191 mg/dL (ref 0–200)
Total CHOL/HDL Ratio: 5
VLDL: 43.8 mg/dL — ABNORMAL HIGH (ref 0.0–40.0)

## 2011-12-05 ENCOUNTER — Encounter: Payer: Self-pay | Admitting: Internal Medicine

## 2012-01-04 ENCOUNTER — Encounter: Payer: Self-pay | Admitting: *Deleted

## 2012-01-18 ENCOUNTER — Encounter: Payer: Self-pay | Admitting: Gastroenterology

## 2012-04-04 ENCOUNTER — Other Ambulatory Visit: Payer: Self-pay | Admitting: Gynecology

## 2012-06-24 ENCOUNTER — Encounter: Payer: Self-pay | Admitting: Internal Medicine

## 2012-07-15 ENCOUNTER — Encounter: Payer: Self-pay | Admitting: Internal Medicine

## 2012-07-15 ENCOUNTER — Ambulatory Visit (AMBULATORY_SURGERY_CENTER): Payer: Medicare Other | Admitting: *Deleted

## 2012-07-15 VITALS — Ht 67.0 in | Wt 189.8 lb

## 2012-07-15 DIAGNOSIS — Z86010 Personal history of colonic polyps: Secondary | ICD-10-CM

## 2012-07-15 DIAGNOSIS — Z1211 Encounter for screening for malignant neoplasm of colon: Secondary | ICD-10-CM

## 2012-07-15 MED ORDER — MOVIPREP 100 G PO SOLR: 1.0000 | Freq: Once | ORAL | Status: DC

## 2012-07-15 NOTE — Progress Notes (Signed)
No egg or allergy. ewm

## 2012-07-30 ENCOUNTER — Encounter: Payer: Self-pay | Admitting: Internal Medicine

## 2012-07-30 ENCOUNTER — Ambulatory Visit (AMBULATORY_SURGERY_CENTER): Payer: Medicare Other | Admitting: Internal Medicine

## 2012-07-30 VITALS — BP 109/76 | HR 67 | Temp 98.8°F | Resp 13 | Ht 67.0 in | Wt 189.0 lb

## 2012-07-30 DIAGNOSIS — Z8371 Family history of colonic polyps: Secondary | ICD-10-CM

## 2012-07-30 DIAGNOSIS — Z83719 Family history of colon polyps, unspecified: Secondary | ICD-10-CM

## 2012-07-30 DIAGNOSIS — Z860100 Personal history of colon polyps, unspecified: Secondary | ICD-10-CM

## 2012-07-30 DIAGNOSIS — Z1211 Encounter for screening for malignant neoplasm of colon: Secondary | ICD-10-CM

## 2012-07-30 MED ORDER — SODIUM CHLORIDE 0.9 % IV SOLN: 500.0000 mL | INTRAVENOUS | Status: DC

## 2012-07-30 MED ORDER — FLEET ENEMA 7-19 GM/118ML RE ENEM
1.0000 | ENEMA | Freq: Once | RECTAL | Status: AC
  Administered 2012-07-30: 1 via RECTAL

## 2012-07-30 NOTE — Op Note (Signed)
[facility] [address] [place], [address]   COLONOSCOPY PROCEDURE REPORT  PATIENT: Kelly, [name]  MR#: [id number] BIRTHDATE: Jan 21, 1941 , 71  yrs. old GENDER: Female ENDOSCOPIST: Hart Carwin, MD REFERRED BY:  recall colonoscopy PROCEDURE DATE:  07/30/2012 PROCEDURE:   Colonoscopy, screening ASA CLASS:   Class II INDICATIONS:2003 colon - polyp, 2008 colon- polyp, incomplete exam due to redundancy, and Patient's family history of colon polyps. MEDICATIONS: MAC sedation, administered by CRNA and propofol (Diprivan) 350mg  IV  DESCRIPTION OF PROCEDURE:   After the risks and benefits and of the procedure were explained, informed consent was obtained.  A digital rectal exam revealed decreased sphincter tone.    The LB [id number]-[id number]  endoscope was introduced through the anus and advanced to the cecum, which was identified by both the appendix and ileocecal valve .  The quality of the prep was good, using MoviPrep .  The instrument was then slowly withdrawn as the colon was fully examined.     COLON FINDINGS: A normal appearing cecum, ileocecal valve, and appendiceal orifice were identified.  The ascending, hepatic flexure, transverse, splenic flexure, descending, sigmoid colon and rectum appeared unremarkable. The entire colon was redundant and hypotonic , difficult to traverse with a pediatric scope. No polyps or cancers were seen.     Retroflexed views revealed no abnormalities.     The scope was then withdrawn from the patient and the procedure completed.  COMPLICATIONS: There were no complications. ENDOSCOPIC IMPRESSION: Normal colon , redundant colon  RECOMMENDATIONS: High fiber diet   REPEAT EXAM: In 10 year(s)  for Colonoscopy.  cc:  _______________________________ eSignedHart Carwin, MD 07/30/2012 10:32 AM     PATIENT NAME:  Kelly, [name] MR#: [id number]

## 2012-07-30 NOTE — Progress Notes (Signed)
Lidocaine-40mg IV prior to Propofol InductionPropofol given over incremental dosages 

## 2012-07-30 NOTE — Progress Notes (Signed)
Patient did not experience any of the following events: a burn prior to discharge; a fall within the facility; wrong site/side/patient/procedure/implant event; or a hospital transfer or hospital admission upon discharge from the facility. (G8907) Patient did not have preoperative order for IV antibiotic SSI prophylaxis. (G8918)  

## 2012-07-30 NOTE — Progress Notes (Signed)
2130 Fleets enema given per order of Dr Juanda Chance.   Results are clear.

## 2012-07-30 NOTE — Patient Instructions (Addendum)
Discharge instructions given with verbal understanding. Normal exam. Resume previous medications. YOU HAD AN ENDOSCOPIC PROCEDURE TODAY AT THE [facility]: Refer to the procedure report that was given to you for any specific questions about what was found during the examination.  If the procedure report does not answer your questions, please call your gastroenterologist to clarify.  If you requested that your care partner not be given the details of your procedure findings, then the procedure report has been included in a sealed envelope for you to review at your convenience later.  YOU SHOULD EXPECT: Some feelings of bloating in the abdomen. Passage of more gas than usual.  Walking can help get rid of the air that was put into your GI tract during the procedure and reduce the bloating. If you had a lower endoscopy (such as a colonoscopy or flexible sigmoidoscopy) you may notice spotting of blood in your stool or on the toilet paper. If you underwent a bowel prep for your procedure, then you may not have a normal bowel movement for a few days.  DIET: Your first meal following the procedure should be a light meal and then it is ok to progress to your normal diet.  A half-sandwich or bowl of soup is an example of a good first meal.  Heavy or fried foods are harder to digest and may make you feel nauseous or bloated.  Likewise meals heavy in dairy and vegetables can cause extra gas to form and this can also increase the bloating.  Drink plenty of fluids but you should avoid alcoholic beverages for 24 hours.  ACTIVITY: Your care partner should take you home directly after the procedure.  You should plan to take it easy, moving slowly for the rest of the day.  You can resume normal activity the day after the procedure however you should NOT DRIVE or use heavy machinery for 24 hours (because of the sedation medicines used during the test).    SYMPTOMS TO REPORT IMMEDIATELY: A gastroenterologist  can be reached at any hour.  During normal business hours, 8:30 AM to 5:00 PM Monday through Friday, call [phone].  After hours and on weekends, please call the GI answering service at [phone] who will take a message and have the physician on call contact you.   Following lower endoscopy (colonoscopy or flexible sigmoidoscopy):  Excessive amounts of blood in the stool  Significant tenderness or worsening of abdominal pains  Swelling of the abdomen that is new, acute  Fever of 100F or higher  FOLLOW UP: If any biopsies were taken you will be contacted by phone or by letter within the next 1-3 weeks.  Call your gastroenterologist if you have not heard about the biopsies in 3 weeks.  Our staff will call the home number listed on your records the next business day following your procedure to check on you and address any questions or concerns that you may have at that time regarding the information given to you following your procedure. This is a courtesy call and so if there is no answer at the home number and we have not heard from you through the emergency physician on call, we will assume that you have returned to your regular daily activities without incident.  SIGNATURES/CONFIDENTIALITY: You and/or your care partner have signed paperwork which will be entered into your electronic medical record.  These signatures attest to the fact that that the information above on your After Visit Summary has been reviewed   and is understood.  Full responsibility of the confidentiality of this discharge information lies with you and/or your care-partner. 

## 2012-07-31 ENCOUNTER — Telehealth: Payer: Self-pay | Admitting: *Deleted

## 2012-07-31 NOTE — Telephone Encounter (Signed)
  Follow up Call-  Call back number 07/30/2012  Post procedure Call Back phone  # [phone]  Permission to leave phone message Yes     Patient questions:  Do you have a fever, pain , or abdominal swelling? no Pain Score  0 *  Have you tolerated food without any problems? yes  Have you been able to return to your normal activities? yes  Do you have any questions about your discharge instructions: Diet   no Medications  no Follow up visit  no  Do you have questions or concerns about your Care? no  Actions: * If pain score is 4 or above: No action needed, pain <4.

## 2012-08-21 ENCOUNTER — Encounter: Payer: Self-pay | Admitting: Internal Medicine

## 2012-12-10 ENCOUNTER — Encounter: Payer: Self-pay | Admitting: Internal Medicine

## 2012-12-10 ENCOUNTER — Other Ambulatory Visit (INDEPENDENT_AMBULATORY_CARE_PROVIDER_SITE_OTHER): Payer: Medicare Other

## 2012-12-10 ENCOUNTER — Ambulatory Visit (INDEPENDENT_AMBULATORY_CARE_PROVIDER_SITE_OTHER): Payer: Medicare Other | Admitting: Internal Medicine

## 2012-12-10 VITALS — BP 100/70 | HR 64 | Temp 97.5°F | Ht 67.0 in | Wt 187.8 lb

## 2012-12-10 DIAGNOSIS — E663 Overweight: Secondary | ICD-10-CM

## 2012-12-10 DIAGNOSIS — E785 Hyperlipidemia, unspecified: Secondary | ICD-10-CM

## 2012-12-10 DIAGNOSIS — Z0000 Encounter for general adult medical examination without abnormal findings: Secondary | ICD-10-CM

## 2012-12-10 DIAGNOSIS — Z9189 Other specified personal risk factors, not elsewhere classified: Secondary | ICD-10-CM

## 2012-12-10 LAB — COMPREHENSIVE METABOLIC PANEL
Albumin: 4.2 g/dL (ref 3.5–5.2)
Alkaline Phosphatase: 39 U/L (ref 39–117)
BUN: 17 mg/dL (ref 6–23)
Calcium: 9.4 mg/dL (ref 8.4–10.5)
Chloride: 104 mEq/L (ref 96–112)
Glucose, Bld: 99 mg/dL (ref 70–99)
Potassium: 4.1 mEq/L (ref 3.5–5.1)

## 2012-12-10 LAB — HEPATIC FUNCTION PANEL
ALT: 17 U/L (ref 0–35)
Alkaline Phosphatase: 39 U/L (ref 39–117)
Bilirubin, Direct: 0.1 mg/dL (ref 0.0–0.3)
Total Bilirubin: 0.6 mg/dL (ref 0.3–1.2)

## 2012-12-10 LAB — LIPID PANEL
Cholesterol: 186 mg/dL (ref 0–200)
Triglycerides: 243 mg/dL — ABNORMAL HIGH (ref 0.0–149.0)

## 2012-12-10 MED ORDER — ATORVASTATIN CALCIUM 40 MG PO TABS: 20.0000 mg | ORAL_TABLET | Freq: Every day | ORAL | Status: DC

## 2012-12-10 MED ORDER — ESTRADIOL 0.1 MG/GM VA CREA: 0.5000 g | TOPICAL_CREAM | VAGINAL | Status: DC

## 2012-12-10 NOTE — Progress Notes (Signed)
Subjective:    Patient ID: Kelly [name], female    DOB: 09/19/1940, 72 y.o.   MRN: [id number]  HPI The patient is here for annual Medicare wellness examination and management of other chronic and acute problems.  Doing well with a minor injury to ring finger right.   The risk factors are reflected in the social history.  The roster of all physicians providing medical care to patient - is listed in the Snapshot section of the chart.  Activities of daily living:  The patient is 100% inedpendent in all ADLs: dressing, toileting, feeding as well as independent mobility  Home safety : The patient has smoke detectors in the home. Falls - no falls. Home is fall safe. They wear seatbelts. No firearms at home. There is no violence in the home.   There is no risks for hepatitis, STDs or HIV. There is history of blood transfusion. They have no travel history to infectious disease endemic areas of the world.  The patient has seen their dentist in the last six month. They hav seen their eye doctor in the last year. They  admit to any hearing difficulty and have not had audiologic testing in the last year.    They do not  have excessive sun exposure. Discussed the need for sun protection: hats, long sleeves and use of sunscreen if there is significant sun exposure.   Diet: the importance of a healthy diet is discussed. They do have a healthy diet.  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  Also walks.The benefits of regular aerobic exercise were discussed.  Depression screen: there are no signs or vegative symptoms of depression- irritability, change in appetite, anhedonia, sadness/tearfullness.  Cognitive assessment: the patient manages all their financial and personal affairs and is actively engaged.   The following portions of the patient's history were reviewed and updated as appropriate:  allergies, current medications, past family history, past medical history,  past surgical history, past social history  and problem list.  Vision, hearing, body mass index were assessed and reviewed.   During the course of the visit the patient was educated and counseled about appropriate screening and preventive services including : fall prevention , diabetes screening, nutrition counseling, colorectal cancer screening, and recommended immunizations.  Past Medical History  Diagnosis Date  . Hyperlipidemia   . Postmenopausal HRT (hormone replacement therapy)   . Hepatitis     CMV 1992  . Scarlet fever as a teen  . Breast mass     fibocystic breast dx  . Blood transfusion without reported diagnosis   . Allergy    Past Surgical History  Procedure Laterality Date  . Appendectomy    . Tonsillectomy    . Caesarean section    . Bunionectomy    . Foot tendon surgery      left   . Correction hammer toe    . Colonoscopy    . Polypectomy     Family History  Problem Relation Age of Onset  . Pancreatic cancer Mother   . Hyperlipidemia Mother   . Hypertension Mother   . Polymyalgia rheumatica Mother   . Dementia Father    History   Social History  . Marital Status: Single    Spouse Name: N/A    Number of Children: 2  . Years of Education: 16   Occupational History  . Kelly [name]  webinars on Kelly [name]  .     Social History Main Topics  . Smoking status: Never Smoker   . Smokeless tobacco: Never Used  . Alcohol Use: Yes     Comment: rarely will have a glass of wine  . Drug Use: No  . Sexually Active: Yes -- Female partner(s)   Other Topics Concern  . 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 Married '67--24 yrs. - widowed '92.  2 dtrs - '69, '73; 6 grandchildren. ACP/EoL - yes CPR, yes for short term ventilation, no prolonged heroic care in an irreversible state.      Current Outpatient Prescriptions on File Prior to Visit  Medication Sig Dispense Refill  . atorvastatin (LIPITOR) 20 MG tablet Take 20 mg by mouth daily.      . Biotin 5000 MCG TABS Take 1 capsule by mouth daily.      . Calcium Citrate-Vitamin D (CALCIUM CITRATE + PO) Take by mouth daily.      . Cholecalciferol (VITAMIN D) 2000 UNITS tablet Take 2,000 Units by mouth daily.        Marland Kitchen estradiol (CLIMARA - DOSED IN MG/24 HR) 0.025 mg/24hr Place 1 patch onto the skin once a week. Patient does  1/2 patch q week      . multivitamin (THERAGRAN) per tablet Take 1 tablet by mouth daily.        . NON FORMULARY Prometrium--every four months for 12 days.       . progesterone (PROMETRIUM) 200 MG capsule Take 1 capsule (200 mg total) by mouth as directed. 12 days out of every 6 months.       No current facility-administered medications on file prior to visit.     Review of Systems Constitutional:  Negative for fever, chills, activity change and unexpected weight change.  HEENT:  Negative for hearing loss, ear pain, congestion, neck stiffness and postnasal drip. Negative for sore throat or swallowing problems. Negative for dental complaints.   Eyes: Negative for vision loss or change in visual acuity.  Respiratory: Negative for chest tightness and wheezing. Negative for DOE.   Cardiovascular: Negative for chest pain or palpitations. No decreased exercise tolerance Gastrointestinal: No change in bowel habit. No bloating or gas. No reflux or indigestion Genitourinary: Negative for urgency, frequency, flank pain and difficulty urinating.  Musculoskeletal: Negative for myalgias, back pain, arthralgias and gait problem.  Neurological: Negative for dizziness, tremors, weakness and headaches.  Hematological: Negative for adenopathy.  Psychiatric/Behavioral: Negative for behavioral problems and dysphoric mood.       Objective:   Physical Exam Filed Vitals:   12/10/12 0910  BP: 100/70  Pulse: 64   Temp: 97.5 F (36.4 C)   Wt Readings from Last 3 Encounters:  12/10/12 187 lb 12.8 oz (85.186 kg)  07/30/12 189 lb (85.73 kg)  07/15/12 189 lb 12.8 oz (86.093 kg)   Gen'l: well nourished, well developed Woman in no distress HEENT - [place]/AT, EACs/TMs normal, oropharynx with native dentition in good condition, no buccal or palatal lesions, posterior pharynx clear, mucous membranes moist. C&S clear, PERRLA, fundi - normal Neck - supple, no thyromegaly Nodes- negative submental, cervical, supraclavicular regions Chest - no deformity, no CVAT Lungs - clear without rales, wheezes. No increased work of breathing Breast - deferred to gyn & mammogram Cardiovascular - regular rate and rhythm, quiet precordium, no murmurs, rubs or gallops, 2+ radial, DP and PT pulses Abdomen -  BS+ x 4, no HSM, no guarding or rebound or tenderness Pelvic - deferred to gyn Rectal - deferred to gyn Extremities - no clubbing, cyanosis, edema or deformity.  Neuro - A&O x 3, CN II-XII normal, motor strength normal and equal, DTRs 2+ and symmetrical biceps, radial, and patellar tendons. Cerebellar - no tremor, no rigidity, fluid movement and normal gait. Derm - Head, neck, back, abdomen and extremities without suspicious lesions  Recent Results (from the past 2160 hour(s))  HEPATIC FUNCTION PANEL     Status: None   Collection Time    12/10/12 10:08 AM      Result Value Range   Total Bilirubin 0.6  0.3 - 1.2 mg/dL   Bilirubin, Direct 0.1  0.0 - 0.3 mg/dL   Alkaline Phosphatase 39  39 - 117 U/L   AST 18  0 - 37 U/L   ALT 17  0 - 35 U/L   Total Protein 7.1  6.0 - 8.3 g/dL   Albumin 4.2  3.5 - 5.2 g/dL  COMPREHENSIVE METABOLIC PANEL     Status: None   Collection Time    12/10/12 10:08 AM      Result Value Range   Sodium 139  135 - 145 mEq/L   Potassium 4.1  3.5 - 5.1 mEq/L   Chloride 104  96 - 112 mEq/L   CO2 30  19 - 32 mEq/L   Glucose, Bld 99  70 - 99 mg/dL   BUN 17  6 - 23 mg/dL   Creatinine, Ser 0.7  0.4 -  1.2 mg/dL   Total Bilirubin 0.6  0.3 - 1.2 mg/dL   Alkaline Phosphatase 39  39 - 117 U/L   AST 18  0 - 37 U/L   ALT 17  0 - 35 U/L   Total Protein 7.1  6.0 - 8.3 g/dL   Albumin 4.2  3.5 - 5.2 g/dL   Calcium 9.4  8.4 - 91.4 mg/dL   GFR 78.29  >56.21 mL/min  LIPID PANEL     Status: Abnormal   Collection Time    12/10/12 10:08 AM      Result Value Range   Cholesterol 186  0 - 200 mg/dL   Comment: ATP III Classification       Desirable:  < 200 mg/dL               Borderline High:  200 - 239 mg/dL          High:  > = 308 mg/dL   Triglycerides 657.8 (*) 0.0 - 149.0 mg/dL   Comment: Normal:  <469 mg/dLBorderline High:  150 - 199 mg/dL   HDL 62.95 (*) >28.41 mg/dL   VLDL 32.4 (*) 0.0 - 40.1 mg/dL   Total CHOL/HDL Ratio 5     Comment:                Men          Women1/2 Average Risk     3.4          3.3Average Risk          5.0          4.42X Average Risk          9.6          7.13X Average Risk          15.0          11.0  LDL CHOLESTEROL, DIRECT     Status: None   Collection Time    12/10/12 10:08 AM      Result Value Range   Direct LDL 111.5     Comment: Optimal:  <100 mg/dLNear or Above Optimal:  100-129 mg/dLBorderline High:  130-159 mg/dLHigh:  160-189 mg/dLVery High:  >190 mg/dL          Assessment & Plan:

## 2012-12-10 NOTE — Patient Instructions (Addendum)
Thanks for coming in. You are doing well. Physical exam is normal  Health maintenance is current and up to date re: colon, breast and cervical cancer screening; immunizations are up to date.  Labs today - results will be available on MyChart  Please return as needed or in 1 year.

## 2012-12-11 NOTE — Assessment & Plan Note (Signed)
Interval history w/o major illness or surgery. Limited physical exam, sans breast and pelvic, is normal. Lab results are in normal range. She is current with colorectal and breast cancer screening. Immunizations are up to date. She does remain active.  In summary  A nice woman who appears medically stable. She will return in 1 year or sooner as needed.

## 2012-12-11 NOTE — Assessment & Plan Note (Signed)
BMI 29.4 July '14. Patient reminded to address her weight as a health concern. She has lost 20 lbs.  Plan Diet management: smart food choices, PORTION SIZE CONTROL, regular exercise. Goal - to loose 1-2 lbs.month. Target weight - 175

## 2012-12-11 NOTE — Assessment & Plan Note (Signed)
Taking and tolerating medication. [place] with LDL better than goal of 130 or less (NCEP-ATP III), HDL shy of goal 40+. Liver functions normal  Plan Continue present medication

## 2012-12-11 NOTE — Assessment & Plan Note (Signed)
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  Plan Advised to delay surgery until there is more deformity of 1st and 2nd toes.

## 2013-02-07 ENCOUNTER — Encounter: Payer: Self-pay | Admitting: Internal Medicine

## 2013-02-07 ENCOUNTER — Ambulatory Visit (INDEPENDENT_AMBULATORY_CARE_PROVIDER_SITE_OTHER): Payer: Medicare Other | Admitting: Internal Medicine

## 2013-02-07 VITALS — BP 120/72 | HR 90 | Temp 100.0°F

## 2013-02-07 DIAGNOSIS — L500 Allergic urticaria: Secondary | ICD-10-CM

## 2013-02-07 MED ORDER — HYDROXYZINE HCL 25 MG PO TABS: 25.0000 mg | ORAL_TABLET | Freq: Four times a day (QID) | ORAL | Status: DC | PRN

## 2013-02-07 MED ORDER — METHYLPREDNISOLONE ACETATE 80 MG/ML IJ SUSP
80.0000 mg | Freq: Once | INTRAMUSCULAR | Status: AC
  Administered 2013-02-07: 80 mg via INTRAMUSCULAR

## 2013-02-07 MED ORDER — PREDNISONE (PAK) 10 MG PO TABS: 10.0000 mg | ORAL_TABLET | ORAL | Status: DC

## 2013-02-07 NOTE — Patient Instructions (Addendum)
It was good to see you today. We have reviewed your prior records including labs and tests today Will list amoxicillin as a potential allergy at this time Medrol shot given today and prednisone taper over next 6 days to treat allergic urticaria Also use hydroxyzine every 6 hours as needed for itching symptoms ( in place of Benadryl or Zyrtec) Your prescription(s) have been submitted to your pharmacy. Please take as directed and contact our office if you believe you are having problem(s) with the medication(s).  Use calamine lotion and moisturizing lotion over affected skin as directed Avoid hot shower or bath Call if symptoms worse or unimproved in the next 7-10 days  Hives Hives are itchy, red, swollen areas of the skin. They can vary in size and location on your body. Hives can come and go for hours or several days (acute hives) or for several weeks (chronic hives). Hives do not spread from person to person (noncontagious). They may get worse with scratching, exercise, and emotional stress. CAUSES   Allergic reaction to food, additives, or drugs.  Infections, including the common cold.  Illness, such as vasculitis, lupus, or thyroid disease.  Exposure to sunlight, heat, or cold.  Exercise.  Stress.  Contact with chemicals. SYMPTOMS   Red or white swollen patches on the skin. The patches may change size, shape, and location quickly and repeatedly.  Itching.  Swelling of the hands, feet, and face. This may occur if hives develop deeper in the skin. DIAGNOSIS  Your caregiver can usually tell what is wrong by performing a physical exam. Skin or blood tests may also be done to determine the cause of your hives. In some cases, the cause cannot be determined. TREATMENT  Mild cases usually get better with medicines such as antihistamines. Severe cases may require an emergency epinephrine injection. If the cause of your hives is known, treatment includes avoiding that trigger.  HOME  CARE INSTRUCTIONS   Avoid causes that trigger your hives.  Take antihistamines as directed by your caregiver to reduce the severity of your hives. Non-sedating or low-sedating antihistamines are usually recommended. Do not drive while taking an antihistamine.  Take any other medicines prescribed for itching as directed by your caregiver.  Wear loose-fitting clothing.  Keep all follow-up appointments as directed by your caregiver. SEEK MEDICAL CARE IF:   You have persistent or severe itching that is not relieved with medicine.  You have painful or swollen joints. SEEK IMMEDIATE MEDICAL CARE IF:   You have a fever.  Your tongue or lips are swollen.  You have trouble breathing or swallowing.  You feel tightness in the throat or chest.  You have abdominal pain. These problems may be the first sign of a life-threatening allergic reaction. Call your local emergency services (911 in [place] MAKE SURE YOU:   Understand these instructions.  Will watch your condition.  Will get help right away if you are not doing well or get worse. Document Released: 05/01/2005 Document Revised: 10/31/2011 Document Reviewed: 07/25/2011 [facility] Patient Information 2014 [place], [place].

## 2013-02-07 NOTE — Progress Notes (Signed)
Subjective:    Patient ID: Kelly [name], female    DOB: 09/17/1940, 72 y.o.   MRN: [id number]  Urticaria This is a new problem. The current episode started in the past 7 days. The affected locations include the chest, torso, abdomen, groin, left elbow, right axilla, right elbow, left axilla and back. The rash is characterized by burning, swelling and itchiness. She was exposed to a new animal. Pertinent negatives include no anorexia, congestion, cough, diarrhea, eye pain, facial edema, fatigue, fever, joint pain, nail changes, rhinorrhea, shortness of breath, sore throat or vomiting. Past treatments include antihistamine and antibiotics. The treatment provided no relief. There is no history of allergies, asthma or eczema.    Past Medical History  Diagnosis Date  . Hyperlipidemia   . Postmenopausal HRT (hormone replacement therapy)   . Hepatitis     CMV 1992  . Scarlet fever as a teen  . Breast mass     fibocystic breast dx  . Blood transfusion without reported diagnosis   . Allergy     Review of Systems  Constitutional: Negative for fever and fatigue.  HENT: Negative for congestion, sore throat and rhinorrhea.   Eyes: Negative for pain.  Respiratory: Negative for cough and shortness of breath.   Gastrointestinal: Negative for vomiting, diarrhea and anorexia.  Musculoskeletal: Negative for joint pain.  Skin: Negative for nail changes.       Objective:   Physical Exam BP 120/72  Pulse 90  Temp(Src) 100 F (37.8 C) (Oral)  SpO2 97% Wt Readings from Last 3 Encounters:  12/10/12 187 lb 12.8 oz (85.186 kg)  07/30/12 189 lb (85.73 kg)  07/15/12 189 lb 12.8 oz (86.093 kg)   Constitutional: She appears well-developed and well-nourished. Scratching and uncomfortable but no acute distress.  HENT: Mouth/Throat: no angioedema.  Eyes: no periorbital edema. Conjunctivae and EOM are normal. Pupils are equal, round, and reactive to light. No scleral icterus.  Neck: Normal range of  motion. Neck supple. No JVD present. No thyromegaly present.  Cardiovascular: Normal rate, regular rhythm and normal heart sounds.  No murmur heard. No BLE edema. Pulmonary/Chest: Effort normal and breath sounds normal. No respiratory distress. She has no wheezes Neurological: She is alert and oriented to person, place, and time. No cranial nerve deficit. Coordination, balance, strength, speech and gait are normal.  Skin: Urticaria BUE at elbow fols L>R, forearm L>R, palsm and soles - also prox inner thighs B, torso and chest>back, scalp but face and anterior neck spared - no ulceration or vesicles. No abrasions.  Psychiatric: She has a normal mood and affect. Her behavior is normal. Judgment and thought content normal.   Lab Results  Component Value Date   WBC 6.1 11/11/2010   HGB 13.8 11/11/2010   HCT 39.9 11/11/2010   PLT 332.0 11/11/2010   GLUCOSE 99 12/10/2012   CHOL 186 12/10/2012   TRIG 243.0* 12/10/2012   HDL 37.70* 12/10/2012   LDLDIRECT 111.5 12/10/2012   LDLCALC 114* 12/16/2010   ALT 17 12/10/2012   ALT 17 12/10/2012   AST 18 12/10/2012   AST 18 12/10/2012   NA 139 12/10/2012   K 4.1 12/10/2012   CL 104 12/10/2012   CREATININE 0.7 12/10/2012   BUN 17 12/10/2012   CO2 30 12/10/2012   TSH 1.16 11/11/2010      Assessment & Plan:   See problem list. Medications and labs reviewed today.  Urticaria - unclear trigger but cannot exclude amoxicillin or pet exposure No other med changes  No hx same  IM medrol pred taper x 6 days Hydroxyzine 25 prn List amox as allergy

## 2013-03-18 ENCOUNTER — Ambulatory Visit (INDEPENDENT_AMBULATORY_CARE_PROVIDER_SITE_OTHER): Payer: Medicare Other

## 2013-03-18 DIAGNOSIS — Z23 Encounter for immunization: Secondary | ICD-10-CM

## 2013-07-30 ENCOUNTER — Encounter: Payer: Self-pay | Admitting: Podiatry

## 2013-07-30 ENCOUNTER — Ambulatory Visit (INDEPENDENT_AMBULATORY_CARE_PROVIDER_SITE_OTHER): Payer: Medicare HMO

## 2013-07-30 ENCOUNTER — Ambulatory Visit (INDEPENDENT_AMBULATORY_CARE_PROVIDER_SITE_OTHER): Payer: Medicare HMO | Admitting: Podiatry

## 2013-07-30 VITALS — BP 115/76 | HR 74 | Resp 12

## 2013-07-30 DIAGNOSIS — R52 Pain, unspecified: Secondary | ICD-10-CM

## 2013-07-30 DIAGNOSIS — M7750 Other enthesopathy of unspecified foot: Secondary | ICD-10-CM

## 2013-07-30 DIAGNOSIS — L84 Corns and callosities: Secondary | ICD-10-CM

## 2013-07-30 DIAGNOSIS — M2010 Hallux valgus (acquired), unspecified foot: Secondary | ICD-10-CM

## 2013-07-30 DIAGNOSIS — M2040 Other hammer toe(s) (acquired), unspecified foot: Secondary | ICD-10-CM

## 2013-07-30 NOTE — Progress Notes (Signed)
   Subjective:    Patient ID: Kelly [name], female    DOB: Nov 30, 1940, 73 y.o.   MRN: [id number]  HPI PT STATED INJURED THE RT FOOT TOES AND STILL BEEN HURTING 4 WEEKS. THE TOES ARE GETTING WORSE, BUT TRIED NO TREATMENT. ALSO CHECK BUNION IS NOT HURTING BUT THE TOES ARE GOING THE OTHER WAY.Marland Kitchen   Review of Systems  HENT: Positive for sinus pressure.   Genitourinary: Positive for urgency.  Musculoskeletal: Positive for back pain.  Allergic/Immunologic: Positive for environmental allergies.  All other systems reviewed and are negative.       Objective:   Physical Exam        Assessment & Plan:

## 2013-07-31 NOTE — Progress Notes (Signed)
Subjective:     Patient ID: Kelly [name], female   DOB: 1940-06-13, 73 y.o.   MRN: [id number]  Foot Pain   patient presents stating that I injured my toe on my right foot and my bunion is starting to bother me more with elevation of my second toe. States that she is developing keratotic lesions on the side that she cannot take care of herself   Review of Systems  All other systems reviewed and are negative.       Objective:   Physical Exam  Nursing note and vitals reviewed. Constitutional: She is oriented to person, place, and time.  Cardiovascular: Intact distal pulses.   Musculoskeletal: Normal range of motion.  Neurological: She is oriented to person, place, and time.  Skin: Skin is warm.   neurovascular status is intact with muscle strength adequate range of motion within normal limits and no equinus condition noted. 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 Today I debrided tissue to take pressure off and scanned for custom orthotics to reduce pressure against her feet. Reappoint when orthotics returned

## 2013-08-22 ENCOUNTER — Ambulatory Visit (INDEPENDENT_AMBULATORY_CARE_PROVIDER_SITE_OTHER): Payer: Medicare HMO | Admitting: *Deleted

## 2013-08-22 DIAGNOSIS — M2010 Hallux valgus (acquired), unspecified foot: Secondary | ICD-10-CM

## 2013-08-22 NOTE — Progress Notes (Signed)
Dispensed patient's orthotics with oral and written instructions for wearing. Patient will follow up with Dr. Regal in 1 month for an orthotic check. 

## 2013-08-22 NOTE — Patient Instructions (Signed)

## 2013-08-27 ENCOUNTER — Telehealth: Payer: Self-pay | Admitting: *Deleted

## 2013-08-27 NOTE — Telephone Encounter (Signed)
I have a question about my orthotics I just got on Friday.  I returned her call.  She stated that Dr. Paulla Dolly said there was going to be a lift of some sort in the ball of the foot to help shift the weight in that area.  She said the orthotics are flat in that area.  I advised her to bring them back and we can send them back to the lab to have this added.  I informed her it shouldn't take long for them to be returned.  She stated she would bring them by.

## 2013-09-01 ENCOUNTER — Ambulatory Visit: Payer: Medicare HMO | Admitting: Podiatry

## 2013-09-24 ENCOUNTER — Ambulatory Visit (INDEPENDENT_AMBULATORY_CARE_PROVIDER_SITE_OTHER): Payer: Medicare HMO | Admitting: Podiatry

## 2013-09-24 ENCOUNTER — Encounter: Payer: Self-pay | Admitting: Podiatry

## 2013-09-24 VITALS — BP 106/65 | HR 74 | Resp 16

## 2013-09-24 DIAGNOSIS — M898X9 Other specified disorders of bone, unspecified site: Secondary | ICD-10-CM

## 2013-09-24 DIAGNOSIS — L600 Ingrowing nail: Secondary | ICD-10-CM

## 2013-09-24 NOTE — Progress Notes (Signed)
Subjective:     Patient ID: Kelly [name], female   DOB: 06/15/1940, 73 y.o.   MRN: [id number]  HPI patient states he orthotics are doing well but this nail is still hurting on my fifth toe and it did not seem to get better when trim   Review of Systems     Objective:   Physical Exam Neurovascular status intact no change in health history with an incurvated nail bed medial side fifth toe right it's painful when pressed    Assessment:     Possible ingrown versus exostosis fifth digit right foot    Plan:     Reviewed condition and recommended removal of the corner with possibility that ultimately will require bone surgery infiltrated the toe 60 mm like Marcaine mixture remove the medial border exposed matrix and apply chemical phenol 3 applications 30 seconds followed by alcohol lavaged and sterile dressing reappoint her recheck

## 2013-09-24 NOTE — Patient Instructions (Signed)

## 2013-12-15 ENCOUNTER — Other Ambulatory Visit: Payer: Self-pay | Admitting: *Deleted

## 2013-12-15 MED ORDER — ATORVASTATIN CALCIUM 40 MG PO TABS: 20.0000 mg | ORAL_TABLET | Freq: Every day | ORAL | Status: DC

## 2013-12-22 ENCOUNTER — Telehealth: Payer: Self-pay | Admitting: *Deleted

## 2013-12-22 MED ORDER — ATORVASTATIN CALCIUM 40 MG PO TABS: 20.0000 mg | ORAL_TABLET | Freq: Every day | ORAL | Status: DC

## 2013-12-22 NOTE — Telephone Encounter (Signed)
Refill done.  

## 2014-01-27 ENCOUNTER — Encounter: Payer: Self-pay | Admitting: Internal Medicine

## 2014-02-23 ENCOUNTER — Encounter: Payer: Self-pay | Admitting: Internal Medicine

## 2014-02-23 ENCOUNTER — Ambulatory Visit (INDEPENDENT_AMBULATORY_CARE_PROVIDER_SITE_OTHER): Payer: Medicare HMO | Admitting: Internal Medicine

## 2014-02-23 VITALS — BP 110/70 | HR 92 | Temp 99.1°F | Resp 14 | Wt 194.2 lb

## 2014-02-23 DIAGNOSIS — R112 Nausea with vomiting, unspecified: Secondary | ICD-10-CM

## 2014-02-23 DIAGNOSIS — R197 Diarrhea, unspecified: Secondary | ICD-10-CM

## 2014-02-23 DIAGNOSIS — J029 Acute pharyngitis, unspecified: Secondary | ICD-10-CM

## 2014-02-23 NOTE — Progress Notes (Signed)
Pre visit review using our clinic review tool, if applicable. No additional management support is needed unless otherwise documented below in the visit note. 

## 2014-02-23 NOTE — Patient Instructions (Addendum)
Stay on clear liquids for 48-72 hours or until bowels are normal.This would include  jello, sherbert (NOT ice cream), Lipton's chicken noodle soup(NOT cream based soups),Gatorade Lite, flat Ginger ale (without High Fructose Corn Syrup),dry toast or crackers, baked potato.No milk , dairy or grease until bowels are formed. Align , a [employer] , daily if stools are loose. Immodium AD for frankly watery stool. Report increasing pain, fever or rectal bleeding Please take a probiotic , Florastor OR Align, every day until the bowels are normal. This will replace the normal bacteria which  are necessary for formation of normal stool and processing of food.Pepto Bismol for watery stool. Plain Mucinex (NOT D) for thick secretions ;force NON dairy fluids .   Nasal cleansing in the shower as discussed with lather of mild shampoo.After 10 seconds wash off lather while  exhaling through nostrils. Make sure that all residual soap is removed to prevent irritation.  Flonase OR Nasacort AQ 1 spray in each nostril twice a day as needed. Use the "crossover" technique into opposite nostril spraying toward opposite ear @ 45 degree angle, not straight up into nostril.  Use a Neti pot daily only  as needed for significant sinus congestion; going from open side to congested side . Plain Allegra (NOT D )  160 daily , Loratidine 10 mg , OR Zyrtec 10 mg @ bedtime  as needed for itchy eyes & sneezing. Zicam Melts or Zinc lozenges as per package label for sore throat .

## 2014-02-23 NOTE — Progress Notes (Signed)
   Subjective:    Patient ID: Kelly [name], female    DOB: 1941/01/22, 73 y.o.   MRN: [id number]  HPI   She ate 2 fried oysters with her friend 02/21/14  @ 2 pm but sent the remaining oysters back because they tasted "bitter".  As of 2 AM this morning she had nausea and vomiting as well as loose to watery stool. She describes the diarrhea was described as "explosive"  The symptoms persisted until 10:15 today.  Her friend also has had some diarrhea     Review of Systems   She specifically denies abdominal pain, fever, chills, or sweats  She's had no dysuria, pyuria, or hematuria.   She describes some sore throat over the last 6 weeks in the context of nasal congestion and postnasal drainage.     Objective:   Physical Exam   Positive or pertinent findings include: She appears somewhat fatigued but not acutely or acute distress Tongue is coated. There's minimal erythema of uvula. There is no exudate of the posterior pharynx She has slight tenting of the skin There is lateral deviation of the great toes bilaterally.  General appearance :adequately nourished; in no distress. Eyes: No conjunctival inflammation or scleral icterus is present. Oral exam: Dental hygiene is good. Lips and gums are healthy appearing. Heart:  Normal rate and regular rhythm. S1 and S2 normal without gallop, murmur, click, rub or other extra sounds Lungs:Chest clear to auscultation; no wheezes, rhonchi,rales ,or rubs present.No increased work of breathing.  Abdomen: bowel sounds normal, soft and non-tender without masses, organomegaly or hernias noted.  No guarding or rebound. No flank tenderness to percussion. Vascular : all pulses equal ; no bruits present. Skin:Warm & dry.  Intact without suspicious lesions or rashes ; no jaundice  Lymphatic: No lymphadenopathy is noted about the head, neck, axilla            Assessment & Plan:   #1 nausea and vomiting  #2 diarrhea  #3 pharyngitis,  probably from rhinitis with postnasal drainage  Plan: See orders recommendations

## 2014-03-06 ENCOUNTER — Other Ambulatory Visit (INDEPENDENT_AMBULATORY_CARE_PROVIDER_SITE_OTHER): Payer: Medicare HMO

## 2014-03-06 ENCOUNTER — Telehealth: Payer: Self-pay | Admitting: Internal Medicine

## 2014-03-06 DIAGNOSIS — R197 Diarrhea, unspecified: Secondary | ICD-10-CM

## 2014-03-06 DIAGNOSIS — R112 Nausea with vomiting, unspecified: Secondary | ICD-10-CM

## 2014-03-06 DIAGNOSIS — J020 Streptococcal pharyngitis: Secondary | ICD-10-CM

## 2014-03-06 DIAGNOSIS — R1110 Vomiting, unspecified: Secondary | ICD-10-CM

## 2014-03-06 LAB — BASIC METABOLIC PANEL
BUN: 15 mg/dL (ref 6–23)
CHLORIDE: 103 meq/L (ref 96–112)
CO2: 21 mEq/L (ref 19–32)
Calcium: 9.7 mg/dL (ref 8.4–10.5)
Creatinine, Ser: 0.9 mg/dL (ref 0.4–1.2)
GFR: 63.08 mL/min (ref >60.00)
Glucose, Bld: 96 mg/dL (ref 70–99)
POTASSIUM: 4.6 meq/L (ref 3.5–5.1)
Sodium: 139 mEq/L (ref 135–145)

## 2014-03-06 LAB — CBC WITH DIFFERENTIAL/PLATELET
BASOS PCT: 0.5 % (ref 0.0–3.0)
Basophils Absolute: 0 10*3/uL (ref 0.0–0.1)
EOS ABS: 0.1 10*3/uL (ref 0.0–0.7)
EOS PCT: 1.2 % (ref 0.0–5.0)
HEMATOCRIT: 42.6 % (ref 36.0–46.0)
Hemoglobin: 14.1 g/dL (ref 12.0–15.0)
LYMPHS ABS: 2.5 10*3/uL (ref 0.7–4.0)
Lymphocytes Relative: 26.7 % (ref 12.0–46.0)
MCHC: 33.1 g/dL (ref 30.0–36.0)
MCV: 88.4 fl (ref 78.0–100.0)
Monocytes Absolute: 0.8 10*3/uL (ref 0.1–1.0)
Monocytes Relative: 7.9 % (ref 3.0–12.0)
Neutro Abs: 6 10*3/uL (ref 1.4–7.7)
Neutrophils Relative %: 63.7 % (ref 43.0–77.0)
PLATELETS: 429 10*3/uL — AB (ref 150.0–400.0)
RBC: 4.82 Mil/uL (ref 3.87–5.11)
RDW: 12.6 % (ref 11.5–15.5)
WBC: 9.5 10*3/uL (ref 4.0–10.5)

## 2014-03-06 NOTE — Telephone Encounter (Signed)
Patient calling for the results of her lab work.

## 2014-03-13 ENCOUNTER — Encounter: Payer: Medicare HMO | Admitting: Internal Medicine

## 2014-03-19 ENCOUNTER — Ambulatory Visit: Payer: Medicare HMO

## 2014-03-19 DIAGNOSIS — Z23 Encounter for immunization: Secondary | ICD-10-CM

## 2014-03-30 ENCOUNTER — Encounter: Payer: Self-pay | Admitting: Internal Medicine

## 2014-03-30 ENCOUNTER — Ambulatory Visit (INDEPENDENT_AMBULATORY_CARE_PROVIDER_SITE_OTHER): Payer: Medicare HMO | Admitting: Internal Medicine

## 2014-03-30 VITALS — BP 108/64 | HR 65 | Temp 97.4°F | Resp 18 | Ht 67.0 in | Wt 195.8 lb

## 2014-03-30 DIAGNOSIS — E785 Hyperlipidemia, unspecified: Secondary | ICD-10-CM

## 2014-03-30 DIAGNOSIS — Z0000 Encounter for general adult medical examination without abnormal findings: Secondary | ICD-10-CM

## 2014-03-30 DIAGNOSIS — N3941 Urge incontinence: Secondary | ICD-10-CM

## 2014-03-30 DIAGNOSIS — E663 Overweight: Secondary | ICD-10-CM

## 2014-03-30 MED ORDER — MIRABEGRON ER 25 MG PO TB24: 25.0000 mg | ORAL_TABLET | Freq: Every day | ORAL | Status: DC

## 2014-03-30 NOTE — Patient Instructions (Addendum)
We will put in to check your cholesterol. We would like you to come to our lab when you're fasting some morning.  We will send a medicine to the pharmacy called mybetriq. It is a medicine you take once daily that helps to decrease the frequency of urination as well as the urgency when he have to urinate.  Another option would be trying Kegel exercises. These exercises help to strengthen the muscles that surround the bladder and the urethra and help to decrease the urgency and decrease the chance that you will leak urine.  Come back in about one year if you were doing well. If you have any new problems or questions please free to call our office before that time.  Kegel Exercises The goal of Kegel exercises is to isolate and exercise your pelvic floor muscles. These muscles act as a hammock that supports the rectum, vagina, small intestine, and uterus. As the muscles weaken, the hammock sags and these organs are displaced from their normal positions. Kegel exercises can strengthen your pelvic floor muscles and help you to improve bladder and bowel control, improve sexual response, and help reduce many problems and some discomfort during pregnancy. Kegel exercises can be done anywhere and at any time. HOW TO PERFORM KEGEL EXERCISES 1. Locate your pelvic floor muscles. To do this, squeeze (contract) the muscles that you use when you try to stop the flow of urine. You will feel a tightness in the vaginal area (women) and a tight lift in the rectal area (men and women). 2. When you begin, contract your pelvic muscles tight for 2-5 seconds, then relax them for 2-5 seconds. This is one set. Do 4-5 sets with a short pause in between. 3. Contract your pelvic muscles for 8-10 seconds, then relax them for 8-10 seconds. Do 4-5 sets. If you cannot contract your pelvic muscles for 8-10 seconds, try 5-7 seconds and work your way up to 8-10 seconds. Your goal is 4-5 sets of 10 contractions each day. Keep your stomach,  buttocks, and legs relaxed during the exercises. Perform sets of both short and long contractions. Vary your positions. Perform these contractions 3-4 times per day. Perform sets while you are:   Lying in bed in the morning.  Standing at lunch.  Sitting in the late afternoon.  Lying in bed at night. You should do 40-50 contractions per day. Do not perform more Kegel exercises per day than recommended. Overexercising can cause muscle fatigue. Continue these exercises for for at least 15-20 weeks or as directed by your caregiver. Document Released: 04/17/2012 Document Reviewed: 04/17/2012 [facility] Patient Information 2015 [place]. This information is not intended to replace advice given to you by your health care provider. Make sure you discuss any questions you have with your health care provider.   Mirabegron extended-release tablets What is this medicine? MIRABEGRON (MIR a BEG ron) is used to treat overactive bladder. This medicine reduces the amount of bathroom visits. It may also help to control wetting accidents. This medicine may be used for other purposes; ask your health care provider or pharmacist if you have questions. COMMON BRAND NAME(S): Myrbetriq How should I use this medicine? Take this medicine by mouth with a glass of water. Follow the directions on the prescription label. Do not cut, crush or chew this medicine. You can take it with or without food. If it upsets your stomach, take it with food. Take your medicine at regular intervals. Do not take it more often than directed.  Do not stop taking except on your doctor's advice. Talk to your pediatrician regarding the use of this medicine in children. Special care may be needed. Overdosage: If you think you've taken too much of this medicine contact a poison control center or emergency room at once. Overdosage: If you think you have taken too much of this medicine contact a poison control center or emergency room at  once. NOTE: This medicine is only for you. Do not share this medicine with others. What if I miss a dose? If you miss a dose, take it as soon as you can. If it is almost time for your next dose, take only that dose. Do not take double or extra dose. What should I watch for while using this medicine? It may take 8 weeks to notice the full benefit from this medicine. You may need to limit your intake tea, coffee, caffeinated sodas, and alcohol. These drinks may make your symptoms worse. Visit your doctor or health care professional for regular checks on your progress. Check your blood pressure as directed. Ask your doctor or health care professional what your blood pressure should be and when you should contact him or her. What side effects may I notice from receiving this medicine? Side effects that you should report to your doctor or health care professional as soon as possible: -allergic reactions like skin rash, itching or hives, swelling of the face, lips, or tongue -chest pain or palpitations -severe or sudden headache -high blood pressure -fast, irregular heartbeat -redness, blistering, peeling or loosening of the skin, including inside the mouth -signs of infection - fever or chills, pain or difficulty passing urine -trouble passing urine or change in the amount of urine Side effects that usually do not require medical attention (Report these to your doctor or health care professional if they continue or are bothersome.): -joint pain -mild headache -nausea -runny nose This list may not describe all possible side effects. Call your doctor for medical advice about side effects. You may report side effects to FDA at 1-800-FDA-1088. Where should I keep my medicine? Keep out of the reach of children. Store at room temperature between 15 and 30 degrees C (59 and 86 degrees F). Throw away any unused medicine after the expiration date. NOTE: This sheet is a summary. It may not cover all  possible information. If you have questions about this medicine, talk to your doctor, pharmacist, or health care provider.  2015, [employer]. (2012-01-12 15:59:47)

## 2014-03-30 NOTE — Progress Notes (Signed)
Pre visit review using our clinic review tool, if applicable. No additional management support is needed unless otherwise documented below in the visit note. 

## 2014-03-31 DIAGNOSIS — R32 Unspecified urinary incontinence: Secondary | ICD-10-CM | POA: Insufficient documentation

## 2014-03-31 HISTORY — DX: Unspecified urinary incontinence: R32

## 2014-03-31 NOTE — Progress Notes (Signed)
   Subjective:    Patient ID: Kelly [name], female    DOB: [birth year], 73 y.o.   MRN: [id number]  HPI The patient is a 73 year old female who comes in today to establish care. She has past medical history of urinary incontinence, hyperlipidemia, overweight. She is doing fairly well overall although notices that she leaks urine. When she leaks it is in small quantities. She feels she has to rush to the bathroom, she feels she goes quite often. She has taken to wearing pads when she is going out as she is unable to rush to the bathroom as frequently. 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 She denies any chest pain, shortness of breath, abdominal pain, nausea, constipation, diarrhea. She denies any joint pain swelling, she denies any balance problems or falls.  Review of Systems  Constitutional: Negative for fever, activity change, appetite change, fatigue and unexpected weight change.  HENT: Negative.   Eyes: Negative.   Respiratory: Negative for cough, chest tightness, shortness of breath and wheezing.   Cardiovascular: Negative for chest pain, palpitations and leg swelling.  Gastrointestinal: Negative for nausea, abdominal pain, diarrhea, constipation and abdominal distention.  Genitourinary: Positive for frequency and enuresis. Negative for dysuria and difficulty urinating.       Incontinence  Musculoskeletal: Negative for myalgias, arthralgias and gait problem.  Skin: Negative.   Neurological: Negative for dizziness, weakness, light-headedness and headaches.  Psychiatric/Behavioral: Negative.       Objective:   Physical Exam  Constitutional: She is oriented to person, place, and time. She appears well-developed and well-nourished.  HENT:  Head: Normocephalic and atraumatic.  Eyes: EOM are normal.  Neck: Normal range of motion.  Cardiovascular: Normal rate, regular rhythm and intact distal pulses.   Pulmonary/Chest: Effort normal and breath sounds  normal.  Abdominal: Soft. Bowel sounds are normal.  Musculoskeletal: Normal range of motion.  Neurological: She is alert and oriented to person, place, and time. Coordination normal.  Skin: Skin is warm and dry.   Filed Vitals:   03/30/14 1536  BP: 108/64  Pulse: 65  Temp: 97.4 F (36.3 C)  TempSrc: Oral  Resp: 18  Height: 5\' 7"  (1.702 m)  Weight: 195 lb 12.8 oz (88.814 kg)  SpO2: 93%      Assessment & Plan:

## 2014-03-31 NOTE — Assessment & Plan Note (Signed)
She will return fasting for lipid panel. She has been on Lipitor 40 mg daily for some time without any side effects.

## 2014-03-31 NOTE — Assessment & Plan Note (Signed)
Feel she would be a good candidate for mirabegron. Prescription given today. Talked her about side effects. Also spoke with her about Kegel exercises as she is a little hesitant to start medication.

## 2014-03-31 NOTE — Assessment & Plan Note (Signed)
Patient already got flu shot, colonoscopy next due 2018, mammogram up-to-date.

## 2014-03-31 NOTE — Assessment & Plan Note (Signed)
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

## 2014-04-28 ENCOUNTER — Other Ambulatory Visit (INDEPENDENT_AMBULATORY_CARE_PROVIDER_SITE_OTHER): Payer: Medicare HMO

## 2014-04-28 DIAGNOSIS — E785 Hyperlipidemia, unspecified: Secondary | ICD-10-CM

## 2014-04-28 LAB — LIPID PANEL
Cholesterol: 215 mg/dL — ABNORMAL HIGH (ref 0–200)
HDL: 35.9 mg/dL — ABNORMAL LOW
NonHDL: 179.1
Total CHOL/HDL Ratio: 6
Triglycerides: 310 mg/dL — ABNORMAL HIGH (ref 0.0–149.0)
VLDL: 62 mg/dL — ABNORMAL HIGH (ref 0.0–40.0)

## 2014-04-28 LAB — LDL CHOLESTEROL, DIRECT: Direct LDL: 131.6 mg/dL

## 2014-05-05 ENCOUNTER — Emergency Department (HOSPITAL_COMMUNITY)
Admission: EM | Admit: 2014-05-05 | Discharge: 2014-05-05 | Disposition: A | Discharge status: Home / Self Care | Payer: Medicare HMO | Source: Home / Self Care | Attending: Family Medicine | Admitting: Family Medicine

## 2014-05-05 ENCOUNTER — Ambulatory Visit (HOSPITAL_COMMUNITY): Payer: Medicare HMO | Attending: Family Medicine

## 2014-05-05 DIAGNOSIS — R0989 Other specified symptoms and signs involving the circulatory and respiratory systems: Secondary | ICD-10-CM | POA: Insufficient documentation

## 2014-05-05 DIAGNOSIS — R05 Cough: Secondary | ICD-10-CM | POA: Diagnosis present

## 2014-05-05 DIAGNOSIS — R062 Wheezing: Secondary | ICD-10-CM | POA: Insufficient documentation

## 2014-05-05 DIAGNOSIS — J209 Acute bronchitis, unspecified: Secondary | ICD-10-CM

## 2014-05-05 MED ORDER — ALBUTEROL SULFATE HFA 108 (90 BASE) MCG/ACT IN AERS: 2.0000 | INHALATION_SPRAY | RESPIRATORY_TRACT | Status: DC | PRN

## 2014-05-05 MED ORDER — DOXYCYCLINE HYCLATE 100 MG PO CAPS: 100.0000 mg | ORAL_CAPSULE | Freq: Two times a day (BID) | ORAL | Status: DC

## 2014-05-05 NOTE — Discharge Instructions (Signed)

## 2014-05-05 NOTE — ED Notes (Signed)
Pt states that she had cold symptoms, cough and fever since 05/01/2014.

## 2014-05-05 NOTE — ED Provider Notes (Signed)
CSN: [id number]     Arrival date & time 05/05/14  1108 History   First MD Initiated Contact with Patient 05/05/14 1148     Chief Complaint  Patient presents with  . Cough  . Sore Throat   (Consider location/radiation/quality/duration/timing/severity/associated sxs/prior Treatment) HPI           73 year old female presents complaining of cough, congestion, sore throat, rhinorrhea. Her symptoms first began 4 days ago and have gradually worsened. She now feels like the cough is gone deeper into her chest and her cough is productive. She is traveling tomorrow so she wants to make sure she is okay before she travels. No recent travel or sick contacts. No fever, chills, NVD, chest pain, or shortness of breath.     Past Medical History  Diagnosis Date  . Hyperlipidemia   . Postmenopausal HRT (hormone replacement therapy)   . Hepatitis     CMV 1992  . Scarlet fever as a teen  . Breast mass     fibocystic breast dx  . Blood transfusion without reported diagnosis   . Allergy    Past Surgical History  Procedure Laterality Date  . Appendectomy    . Tonsillectomy    . Caesarean section    . Bunionectomy    . Foot tendon surgery      left   . Correction hammer toe    . Colonoscopy    . Polypectomy     Family History  Problem Relation Age of Onset  . Pancreatic cancer Mother   . Hyperlipidemia Mother   . Hypertension Mother   . Polymyalgia rheumatica Mother   . Dementia Father    History  Substance Use Topics  . Smoking status: Never Smoker   . Smokeless tobacco: Never Used  . Alcohol Use: Yes     Comment: rarely will have a glass of wine   OB History    No data available     Review of Systems  Constitutional: Negative for fever and chills.  HENT: Positive for congestion, rhinorrhea and sore throat. Negative for sinus pressure.   Respiratory: Negative for shortness of breath and wheezing.   Cardiovascular: Negative for chest pain.  All other systems reviewed and are  negative.   Allergies  Codeine and Amoxicillin  Home Medications   Prior to Admission medications   Medication Sig Start Date End Date Taking? Authorizing Provider  albuterol (PROVENTIL HFA;VENTOLIN HFA) 108 (90 BASE) MCG/ACT inhaler Inhale 2 puffs into the lungs every 4 (four) hours as needed for wheezing. 05/05/14   Liam Graham, PA-C  atorvastatin (LIPITOR) 40 MG tablet Take 0.5 tablets (20 mg total) by mouth daily. 12/22/13   Lyndal Pulley, DO  Calcium Citrate-Vitamin D (CALCIUM CITRATE + PO) Take by mouth daily.    Historical Provider, MD  doxycycline (VIBRAMYCIN) 100 MG capsule Take 1 capsule (100 mg total) by mouth 2 (two) times daily. 05/05/14   Liam Graham, PA-C  estradiol (CLIMARA - DOSED IN MG/24 HR) 0.05 mg/24hr patch 1/2 patch placed on the skin weekly 03/05/14   Historical Provider, MD  estradiol (ESTRACE) 0.1 MG/GM vaginal cream Place 7.42 Applicatorfuls vaginally every Wednesday and Saturday. 12/10/12   Neena Rhymes, MD  mirabegron ER (MYRBETRIQ) 25 MG TB24 tablet Take 1 tablet (25 mg total) by mouth daily. 03/30/14   Olga Millers, MD  multivitamin [facility]) per tablet Take 1 tablet by mouth daily.      Historical Provider, MD  PREMARIN vaginal  cream Place 1 Applicatorful vaginally 2 (two) times a week.  07/07/13   Historical Provider, MD  progesterone (PROMETRIUM) 200 MG capsule Take 1 capsule (200 mg total) by mouth as directed. 12 days out of every 6 months. 11/29/11   Neena Rhymes, MD   BP 126/83 mmHg  Pulse 68  Temp(Src) 99.2 F (37.3 C) (Oral)  Resp 16  SpO2 95% Physical Exam  Constitutional: She is oriented to person, place, and time. Vital signs are normal. She appears well-developed and well-nourished. No distress.  HENT:  Head: Normocephalic and atraumatic.  Right Ear: External ear normal.  Left Ear: External ear normal.  Nose: Nose normal. Right sinus exhibits no maxillary sinus tenderness and no frontal sinus tenderness. Left sinus  exhibits no maxillary sinus tenderness and no frontal sinus tenderness.  Mouth/Throat: Oropharynx is clear and moist. No oropharyngeal exudate.  Eyes: Conjunctivae are normal. Right eye exhibits no discharge. Left eye exhibits no discharge.  Neck: Normal range of motion. Neck supple.  Cardiovascular: Normal rate, regular rhythm and normal heart sounds.   Pulmonary/Chest: Effort normal and breath sounds normal. No respiratory distress. She has no wheezes. She has no rales.  Lymphadenopathy:    She has no cervical adenopathy.  Neurological: She is alert and oriented to person, place, and time. She has normal strength. Coordination normal.  Skin: Skin is warm and dry. No rash noted. She is not diaphoretic.  Psychiatric: She has a normal mood and affect. Judgment normal.  Nursing note and vitals reviewed.   ED Course  Procedures (including critical care time) Labs Review Labs Reviewed - No data to display  Imaging Review Dg Chest 2 View  05/05/2014   CLINICAL DATA:  Cough and congestion and wheezing for the past 4 days  EXAM: CHEST  2 VIEW  COMPARISON:  None.  FINDINGS: The lungs are mildly hyperinflated. There is no focal infiltrate. There is no pleural effusion or pneumothorax. The heart and pulmonary vascularity are normal. The trachea is midline. There is mild tortuosity of the descending thoracic aorta. The bony thorax is unremarkable.  IMPRESSION: There is no pneumonia. Mild hyperinflation may be voluntary or could reflect acute bronchitic change.   Electronically Signed   By: David  Martinique   On: 05/05/2014 12:49     MDM   1. Acute bronchitis, unspecified organism    Treat with doxycycline and albuterol inhaler. Continue other symptomatic management with over-the-counter medications. Follow-up when necessary if worsening   Meds ordered this encounter  Medications  . albuterol (PROVENTIL HFA;VENTOLIN HFA) 108 (90 BASE) MCG/ACT inhaler    Sig: Inhale 2 puffs into the lungs every 4  (four) hours as needed for wheezing.    Dispense:  1 Inhaler    Refill:  0  . doxycycline (VIBRAMYCIN) 100 MG capsule    Sig: Take 1 capsule (100 mg total) by mouth 2 (two) times daily.    Dispense:  14 capsule    Refill:  [place] Emilygrace Grothe, PA-C 05/05/14 1254

## 2014-06-26 ENCOUNTER — Other Ambulatory Visit: Payer: Self-pay | Admitting: Family Medicine

## 2014-06-26 NOTE — Telephone Encounter (Signed)
Refill done.  

## 2014-11-03 ENCOUNTER — Telehealth: Payer: Self-pay

## 2014-11-03 NOTE — Telephone Encounter (Signed)
Outreach to educate on AWV; LVM for call back

## 2014-11-06 NOTE — Telephone Encounter (Signed)
Call and could not leave a message on home phone; VM on cell was full as well

## 2014-11-09 ENCOUNTER — Other Ambulatory Visit: Payer: Self-pay

## 2015-03-16 ENCOUNTER — Ambulatory Visit (INDEPENDENT_AMBULATORY_CARE_PROVIDER_SITE_OTHER): Payer: PPO

## 2015-03-16 ENCOUNTER — Ambulatory Visit: Payer: Medicare HMO

## 2015-03-16 DIAGNOSIS — Z23 Encounter for immunization: Secondary | ICD-10-CM

## 2015-04-01 ENCOUNTER — Encounter: Payer: Medicare HMO | Admitting: Internal Medicine

## 2015-04-05 ENCOUNTER — Ambulatory Visit (INDEPENDENT_AMBULATORY_CARE_PROVIDER_SITE_OTHER): Payer: PPO | Admitting: Internal Medicine

## 2015-04-05 ENCOUNTER — Ambulatory Visit (INDEPENDENT_AMBULATORY_CARE_PROVIDER_SITE_OTHER)
Admission: RE | Admit: 2015-04-05 | Discharge: 2015-04-05 | Disposition: A | Discharge status: Home / Self Care | Payer: PPO | Source: Ambulatory Visit | Attending: Internal Medicine | Admitting: Internal Medicine

## 2015-04-05 ENCOUNTER — Encounter: Payer: Self-pay | Admitting: Internal Medicine

## 2015-04-05 ENCOUNTER — Other Ambulatory Visit: Payer: PPO

## 2015-04-05 VITALS — BP 110/74 | HR 75 | Temp 97.5°F | Ht 67.0 in | Wt 206.0 lb

## 2015-04-05 DIAGNOSIS — Z0000 Encounter for general adult medical examination without abnormal findings: Secondary | ICD-10-CM

## 2015-04-05 DIAGNOSIS — Z780 Asymptomatic menopausal state: Secondary | ICD-10-CM | POA: Diagnosis not present

## 2015-04-05 DIAGNOSIS — Z23 Encounter for immunization: Secondary | ICD-10-CM | POA: Diagnosis not present

## 2015-04-05 DIAGNOSIS — M79651 Pain in right thigh: Secondary | ICD-10-CM | POA: Diagnosis not present

## 2015-04-05 DIAGNOSIS — E669 Obesity, unspecified: Secondary | ICD-10-CM

## 2015-04-05 MED ORDER — TRETINOIN 0.01 % EX GEL: Freq: Every day | CUTANEOUS | Status: DC

## 2015-04-05 NOTE — Assessment & Plan Note (Signed)
Counseled on diet and exercise today during visit.

## 2015-04-05 NOTE — Patient Instructions (Addendum)
We will get you in with Dr. Charlann Boxer for the thigh pain to see if we can get you moving again.  We recommend about 600 mg of calcium in your diet or with a supplement.   We will get the bone density and have given you the pneumonia booster shot today.  Calcium Intake Recommendations Calcium is a mineral that affects many functions in the body, including:  Blood clotting.  Blood vessel function.  Nerve impulse conduction.  Hormone secretion.  Muscle contraction.  Bone and teeth functions. Most of your body's calcium supply is stored in your bones and teeth. When your calcium stores are low, you may be at risk for low bone mass, bone loss, and bone fractures. Consuming enough calcium helps to grow healthy bones and teeth and to prevent breakdown over time.  It is very important that you get enough calcium if you are:  A child undergoing rapid growth.  An adolescent girl.  A pre- or post-menopausal woman.  A woman whose menstrual cycle has stopped due to anorexia nervosa or regular intense exercise.  An individual with lactose intolerance or a milk allergy.  A vegetarian. WHAT IS MY PLAN?  Try to consume the recommended amount of calcium daily based on your age. Depending on your overall health, your health care provider may recommend increased calcium intake.General daily calcium intake recommendations by age are:  Birth to 6 months: 200 mg.  Infants 7 to 12 months: 260 mg.  Children 1 to 3 years: 700 mg.  Children 4 to 8 years: 1,000 mg.  Children 9 to 13 years: 1,300 mg.  Teens 14 to 18 years: 1,300 mg.  Adults 19 to 50 years: 1,000 mg.  Adult women 51 to 70 years: 1,200 mg.  Adult men 51 to 70 years: 1,000 mg.  Adults 71 years and older: 1,200 mg.  Pregnant and breastfeeding teens: 1,300 mg.  Pregnant and breastfeeding adults: 1,000 mg. WHAT DO I NEED TO KNOW ABOUT CALCIUM INTAKE?  In order for the body to absorb calcium, it needs vitamin D. You can  get vitamin D through:  Direct exposure of the skin to sunlight.  Foods, such as egg yolks, liver, saltwater fish, and fortified milk.  Supplements.  Consuming too much calcium may cause:  Constipation.  Decreased absorption of iron and zinc.  Kidney stones.  Calcium supplements may interact with certain medicines. Check with your health care provider before starting any calcium supplements.  Try to get most of your calcium from food. WHAT FOODS CAN I EAT? Grains Fortified oatmeal. Fortified ready-to-eat cereals. Fortified frozen waffles. Vegetables Turnip greens. Broccoli.  Fruits Fortified orange juice. Meats and Other Protein Sources Canned sardines with bones. Canned salmon with bones. Soy beans. Tofu. Baked beans. Almonds. [place] nuts. Sunflower seeds. Dairy Milk. Yogurt. Cheese. Cottage cheese.  Beverages Fortified soy milk. Fortified rice milk.  Sweets/Desserts Pudding. Ice Cream. Milkshakes. Blackstrap molasses. The items listed above may not be a complete list of recommended foods or beverages. Contact your dietitian for more options.  WHAT FOODS CAN AFFECT MY CALCIUM INTAKE? It may be more difficult for your body to use calcium or calcium may leave your body more quickly if you consume large amounts of:   Sodium.  Protein.  Caffeine.  Alcohol.   This information is not intended to replace advice given to you by your health care provider. Make sure you discuss any questions you have with your health care provider.   Document Released: 12/14/2003 Document  Revised: 05/22/2014 Document Reviewed: 10/07/2013 Elsevier Interactive Patient Education 2016 [place] Maintenance, Female Adopting a healthy lifestyle and getting preventive care can go a long way to promote health and wellness. Talk with your health care provider about what schedule of regular examinations is right for you. This is a good chance for you to check in with your provider about  disease prevention and staying healthy. In between checkups, there are plenty of things you can do on your own. Experts have done a lot of research about which lifestyle changes and preventive measures are most likely to keep you healthy. Ask your health care provider for more information. WEIGHT AND DIET  Eat a healthy diet  Be sure to include plenty of vegetables, fruits, low-fat dairy products, and lean protein.  Do not eat a lot of foods high in solid fats, added sugars, or salt.  Get regular exercise. This is one of the most important things you can do for your health.  Most adults should exercise for at least 150 minutes each week. The exercise should increase your heart rate and make you sweat (moderate-intensity exercise).  Most adults should also do strengthening exercises at least twice a week. This is in addition to the moderate-intensity exercise.  Maintain a healthy weight  Body mass index (BMI) is a measurement that can be used to identify possible weight problems. It estimates body fat based on height and weight. Your health care provider can help determine your BMI and help you achieve or maintain a healthy weight.  For females 58 years of age and older:   A BMI below 18.5 is considered underweight.  A BMI of 18.5 to 24.9 is normal.  A BMI of 25 to 29.9 is considered overweight.  A BMI of 30 and above is considered obese.  Watch levels of cholesterol and blood lipids  You should start having your blood tested for lipids and cholesterol at 72 years of age, then have this test every 5 years.  You may need to have your cholesterol levels checked more often if:  Your lipid or cholesterol levels are high.  You are older than 72 years of age.  You are at high risk for heart disease.  CANCER SCREENING   Lung Cancer  Lung cancer screening is recommended for adults 64-40 years old who are at high risk for lung cancer because of a history of smoking.  A yearly  low-dose CT scan of the lungs is recommended for people who:  Currently smoke.  Have quit within the past 15 years.  Have at least a 30-pack-year history of smoking. A pack year is smoking an average of one pack of cigarettes a day for 1 year.  Yearly screening should continue until it has been 15 years since you quit.  Yearly screening should stop if you develop a health problem that would prevent you from having lung cancer treatment.  Breast Cancer  Practice breast self-awareness. This means understanding how your breasts normally appear and feel.  It also means doing regular breast self-exams. Let your health care provider know about any changes, no matter how small.  If you are in your 20s or 30s, you should have a clinical breast exam (CBE) by a health care provider every 1-3 years as part of a regular health exam.  If you are 71 or older, have a CBE every year. Also consider having a breast X-ray (mammogram) every year.  If you have a family history of  breast cancer, talk to your health care provider about genetic screening.  If you are at high risk for breast cancer, talk to your health care provider about having an MRI and a mammogram every year.  Breast cancer gene (BRCA) assessment is recommended for women who have family members with BRCA-related cancers. BRCA-related cancers include:  Breast.  Ovarian.  Tubal.  Peritoneal cancers.  Results of the assessment will determine the need for genetic counseling and BRCA1 and BRCA2 testing. Cervical Cancer Your health care provider may recommend that you be screened regularly for cancer of the pelvic organs (ovaries, uterus, and vagina). This screening involves a pelvic examination, including checking for microscopic changes to the surface of your cervix (Pap test). You may be encouraged to have this screening done every 3 years, beginning at age 63.  For women ages 66-65, health care providers may recommend pelvic exams  and Pap testing every 3 years, or they may recommend the Pap and pelvic exam, combined with testing for human papilloma virus (HPV), every 5 years. Some types of HPV increase your risk of cervical cancer. Testing for HPV may also be done on women of any age with unclear Pap test results.  Other health care providers may not recommend any screening for nonpregnant women who are considered low risk for pelvic cancer and who do not have symptoms. Ask your health care provider if a screening pelvic exam is right for you.  If you have had past treatment for cervical cancer or a condition that could lead to cancer, you need Pap tests and screening for cancer for at least 20 years after your treatment. If Pap tests have been discontinued, your risk factors (such as having a new sexual partner) need to be reassessed to determine if screening should resume. Some women have medical problems that increase the chance of getting cervical cancer. In these cases, your health care provider may recommend more frequent screening and Pap tests. Colorectal Cancer  This type of cancer can be detected and often prevented.  Routine colorectal cancer screening usually begins at 72 years of age and continues through 72 years of age.  Your health care provider may recommend screening at an earlier age if you have risk factors for colon cancer.  Your health care provider may also recommend using home test kits to check for hidden blood in the stool.  A small camera at the end of a tube can be used to examine your colon directly (sigmoidoscopy or colonoscopy). This is done to check for the earliest forms of colorectal cancer.  Routine screening usually begins at age 12.  Direct examination of the colon should be repeated every 5-10 years through 72 years of age. However, you may need to be screened more often if early forms of precancerous polyps or small growths are found. Skin Cancer  Check your skin from head to toe  regularly.  Tell your health care provider about any new moles or changes in moles, especially if there is a change in a mole's shape or color.  Also tell your health care provider if you have a mole that is larger than the size of a pencil eraser.  Always use sunscreen. Apply sunscreen liberally and repeatedly throughout the day.  Protect yourself by wearing long sleeves, pants, a wide-brimmed hat, and sunglasses whenever you are outside. HEART DISEASE, DIABETES, AND HIGH BLOOD PRESSURE   High blood pressure causes heart disease and increases the risk of stroke. High blood pressure is more  likely to develop in:  People who have blood pressure in the high end of the normal range (130-139/85-89 mm Hg).  People who are overweight or obese.  People who are African American.  If you are 41-8 years of age, have your blood pressure checked every 3-5 years. If you are 74 years of age or older, have your blood pressure checked every year. You should have your blood pressure measured twice--once when you are at a hospital or clinic, and once when you are not at a hospital or clinic. Record the average of the two measurements. To check your blood pressure when you are not at a hospital or clinic, you can use:  An automated blood pressure machine at a pharmacy.  A home blood pressure monitor.  If you are between 84 years and 8 years old, ask your health care provider if you should take aspirin to prevent strokes.  Have regular diabetes screenings. This involves taking a blood sample to check your fasting blood sugar level.  If you are at a normal weight and have a low risk for diabetes, have this test once every three years after 72 years of age.  If you are overweight and have a high risk for diabetes, consider being tested at a younger age or more often. PREVENTING INFECTION  Hepatitis B  If you have a higher risk for hepatitis B, you should be screened for this virus. You are considered  at high risk for hepatitis B if:  You were born in a country where hepatitis B is common. Ask your health care provider which countries are considered high risk.  Your parents were born in a high-risk country, and you have not been immunized against hepatitis B (hepatitis B vaccine).  You have HIV or AIDS.  You use needles to inject street drugs.  You live with someone who has hepatitis B.  You have had sex with someone who has hepatitis B.  You get hemodialysis treatment.  You take certain medicines for conditions, including cancer, organ transplantation, and autoimmune conditions. Hepatitis C  Blood testing is recommended for:  Everyone born from [birth year] through 1963.  Anyone with known risk factors for hepatitis C. Sexually transmitted infections (STIs)  You should be screened for sexually transmitted infections (STIs) including gonorrhea and chlamydia if:  You are sexually active and are younger than 72 years of age.  You are older than 72 years of age and your health care provider tells you that you are at risk for this type of infection.  Your sexual activity has changed since you were last screened and you are at an increased risk for chlamydia or gonorrhea. Ask your health care provider if you are at risk.  If you do not have HIV, but are at risk, it may be recommended that you take a prescription medicine daily to prevent HIV infection. This is called pre-exposure prophylaxis (PrEP). You are considered at risk if:  You are sexually active and do not regularly use condoms or know the HIV status of your partner(s).  You take drugs by injection.  You are sexually active with a partner who has HIV. Talk with your health care provider about whether you are at high risk of being infected with HIV. If you choose to begin PrEP, you should first be tested for HIV. You should then be tested every 3 months for as long as you are taking PrEP.  PREGNANCY   If you are  premenopausal and you may  become pregnant, ask your health care provider about preconception counseling.  If you may become pregnant, take 400 to 800 micrograms (mcg) of folic acid every day.  If you want to prevent pregnancy, talk to your health care provider about birth control (contraception). OSTEOPOROSIS AND MENOPAUSE   Osteoporosis is a disease in which the bones lose minerals and strength with aging. This can result in serious bone fractures. Your risk for osteoporosis can be identified using a bone density scan.  If you are 53 years of age or older, or if you are at risk for osteoporosis and fractures, ask your health care provider if you should be screened.  Ask your health care provider whether you should take a calcium or vitamin D supplement to lower your risk for osteoporosis.  Menopause may have certain physical symptoms and risks.  Hormone replacement therapy may reduce some of these symptoms and risks. Talk to your health care provider about whether hormone replacement therapy is right for you.  HOME CARE INSTRUCTIONS   Schedule regular health, dental, and eye exams.  Stay current with your immunizations.   Do not use any tobacco products including cigarettes, chewing tobacco, or electronic cigarettes.  If you are pregnant, do not drink alcohol.  If you are breastfeeding, limit how much and how often you drink alcohol.  Limit alcohol intake to no more than 1 drink per day for nonpregnant women. One drink equals 12 ounces of beer, 5 ounces of wine, or 1 ounces of hard liquor.  Do not use street drugs.  Do not share needles.  Ask your health care provider for help if you need support or information about quitting drugs.  Tell your health care provider if you often feel depressed.  Tell your health care provider if you have ever been abused or do not feel safe at home.   This information is not intended to replace advice given to you by your health care  provider. Make sure you discuss any questions you have with your health care provider.   Document Released: 11/14/2010 Document Revised: 05/22/2014 Document Reviewed: 04/02/2013 Elsevier Interactive Patient Education [employer].

## 2015-04-05 NOTE — Assessment & Plan Note (Signed)
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

## 2015-04-05 NOTE — Assessment & Plan Note (Addendum)
Given prevnar to update her immunizations. Counseled about diet and exercise for her weight. Talked to her about the need for sun screen while outdoors. 10 year screening recommendations given to patient at visit.

## 2015-04-05 NOTE — Progress Notes (Signed)
   Subjective:    Patient ID: Kelly [name], female    DOB: 07-27-1940, 74 y.o.   MRN: [id number]  HPI Here for medicare wellness. Please see A/P for status and treatment of chronic medical problems.   Having more pain in right thigh. She was exercising with walking and had run a couple of times and then started getting this pain. 3/10 worse with standing from crouching position. Caused her to decrease her activity level. Is working with a trainer on some stretching which has not helped. Present for several weeks at least.   Diet: heart healthy Physical activity: sedentary Depression/mood screen: negative Hearing: intact to whispered voice, some loss Visual acuity: grossly normal, performs annual eye exam  ADLs: capable Fall risk: none Home safety: good Cognitive evaluation: intact to orientation, naming, recall and repetition EOL planning: adv directives discussed, in place but has not discussed with family  I have personally reviewed and have noted 1. The patient's medical and social history - reviewed today no changes 2. Their use of alcohol, tobacco or illicit drugs 3. Their current medications and supplements 4. The patient's functional ability including ADL's, fall risks, home safety risks and hearing or visual impairment. 5. Diet and physical activities 6. Evidence for depression or mood disorders 7. Care team reviewed and updated (available in snapshot)  Review of Systems  Constitutional: Negative for fever, activity change, appetite change, fatigue and unexpected weight change.  HENT: Negative.   Eyes: Negative.   Respiratory: Negative for cough, chest tightness, shortness of breath and wheezing.   Cardiovascular: Negative for chest pain, palpitations and leg swelling.  Gastrointestinal: Negative for nausea, abdominal pain, diarrhea, constipation and abdominal distention.  Genitourinary: Positive for frequency and enuresis. Negative for dysuria and difficulty urinating.         Incontinence  Musculoskeletal: Positive for myalgias. Negative for arthralgias and gait problem.  Skin: Negative.   Neurological: Negative for dizziness, weakness, light-headedness and headaches.  Psychiatric/Behavioral: Negative.       Objective:   Physical Exam  Constitutional: She is oriented to person, place, and time. She appears well-developed and well-nourished.  HENT:  Head: Normocephalic and atraumatic.  Eyes: EOM are normal.  Neck: Normal range of motion.  Cardiovascular: Normal rate, regular rhythm and intact distal pulses.   Pulmonary/Chest: Effort normal and breath sounds normal.  Abdominal: Soft. Bowel sounds are normal.  Musculoskeletal: She exhibits no edema.  Neurological: She is alert and oriented to person, place, and time. Coordination normal.  Skin: Skin is warm and dry.  Psychiatric: She has a normal mood and affect.   Filed Vitals:   04/05/15 1111  BP: 110/74  Pulse: 75  Temp: 97.5 F (36.4 C)  TempSrc: Oral  Height: 5\' 7"  (1.702 m)  Weight: 206 lb 0.6 oz (93.459 kg)      Assessment & Plan:  Prevnar 13 given at visit.

## 2015-04-05 NOTE — Progress Notes (Signed)
Pre visit review using our clinic review tool, if applicable. No additional management support is needed unless otherwise documented below in the visit note. 

## 2015-04-21 ENCOUNTER — Ambulatory Visit (INDEPENDENT_AMBULATORY_CARE_PROVIDER_SITE_OTHER): Payer: PPO | Admitting: Family Medicine

## 2015-04-21 ENCOUNTER — Encounter: Payer: Self-pay | Admitting: Family Medicine

## 2015-04-21 ENCOUNTER — Ambulatory Visit (INDEPENDENT_AMBULATORY_CARE_PROVIDER_SITE_OTHER)
Admission: RE | Admit: 2015-04-21 | Discharge: 2015-04-21 | Disposition: A | Discharge status: Home / Self Care | Payer: PPO | Source: Ambulatory Visit | Attending: Family Medicine | Admitting: Family Medicine

## 2015-04-21 VITALS — BP 112/78 | HR 75 | Wt 205.0 lb

## 2015-04-21 DIAGNOSIS — M79671 Pain in right foot: Secondary | ICD-10-CM | POA: Diagnosis not present

## 2015-04-21 DIAGNOSIS — M79672 Pain in left foot: Secondary | ICD-10-CM

## 2015-04-21 DIAGNOSIS — M79651 Pain in right thigh: Secondary | ICD-10-CM | POA: Diagnosis not present

## 2015-04-21 DIAGNOSIS — S76311A Strain of muscle, fascia and tendon of the posterior muscle group at thigh level, right thigh, initial encounter: Secondary | ICD-10-CM | POA: Insufficient documentation

## 2015-04-21 NOTE — Assessment & Plan Note (Signed)
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 Patient does have callus formations or any noted as well. We discussed home care, over-the-counter orthotics, icing protocol. Patient will do some mild strengthening to help slow the progression. Patient will come back and see me again in 4-6 weeks for further evaluation.

## 2015-04-21 NOTE — Assessment & Plan Note (Signed)
Patient does have what appears to be more of a hamstring strain. We discussed with patient about different treatment options. Patient has elected try conservative therapy. Patient is going to increase his activity slowly. Do a compression sleeve. Given home exercises. We discussed over-the-counter natural supplementations a could be beneficial. Patient and will come back again in 4-6 weeks for further evaluation. If continuing have trouble we'll consider ultrasound to make sure there is no tear but no gapping appreciated today. Full strength today.

## 2015-04-21 NOTE — Progress Notes (Signed)
[name] [place], [place] [address] Phone: [phone] Subjective:    I'm seeing this patient by the request  of:  Hoyt Koch, MD   CC: Right hamstring pain  [id number] Kelly [name] is a 74 y.o. female coming in with complaint of right hamstring pain. Patient was doing a class where she was walking a significant amount of time. Started to try to increase her pes. Patient states that that caused a severe pain. Was having increasing tightness for quite some time. Injury happened in October. Patient states that since then continues to have the tightness. Continues to work out regularly. States though that she continues to have the discomfort regularly. Seems to be mostly in the buttocks region with some mild radiation down the leg. Denies any weakness. Can wake her up at night. Rates the severity of 4 out of 10. Has not tried any significant home modalities. Patient did work with individual in a kinesiology lab.   Patient is also complaining of bilateral foot pain. Seems to be right greater than left. Has seemed to titrate since has had multiple surgeries for her toes previously. Patient states that her right foot seems be given her more pain. When she tries to increase further walking more pain on the plantar aspect as well as around the first toe. Denies any numbness. States that certain shoes seem to be beneficial. 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 Wondering what can be done.     Past Medical History  Diagnosis Date  . Hyperlipidemia   . Postmenopausal HRT (hormone replacement therapy)   . Hepatitis     CMV 1992  . Scarlet fever as a teen  . Breast mass     fibocystic breast dx  . Blood transfusion without reported diagnosis   . Allergy    Past Surgical History  Procedure Laterality Date  . Appendectomy    . Tonsillectomy    . Caesarean section    . Bunionectomy    . Foot tendon surgery      left   .  Correction hammer toe    . Colonoscopy    . Polypectomy     Social History   Social History  . Marital Status: Single    Spouse Name: N/A  . Number of Children: 2  . Years of Education: 16   Occupational History  . 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     Social History Main Topics  . Smoking status: Never Smoker   . Smokeless tobacco: Never Used  . Alcohol Use: Yes     Comment: rarely will have a glass of wine  . Drug Use: No  . Sexual Activity:    Partners: Male   Other Topics Concern  . Not on file   Social History Narrative   [name]; Master's [employer]. Lives alone and independently. Married '67--24 yrs. - widowed '92.  2 dtrs - '69, '73; 6 grandchildren. ACP/EoL - yes CPR, yes for short term ventilation, no prolonged heroic care in an irreversible state.    Allergies  Allergen Reactions  . Codeine     REACTION: nausea  . Amoxicillin Rash   Family History  Problem Relation Age of Onset  . Pancreatic cancer Mother   . Hyperlipidemia Mother   . Hypertension Mother   . Polymyalgia rheumatica Mother   . Dementia Father  Past medical history, social, surgical and family history all reviewed in electronic medical record.   Review of Systems: No headache, visual changes, nausea, vomiting, diarrhea, constipation, dizziness, abdominal pain, skin rash, fevers, chills, night sweats, weight loss, swollen lymph nodes, body aches, joint swelling, muscle aches, chest pain, shortness of breath, mood changes.   Objective Blood pressure 112/78, pulse 75, weight 205 lb (92.987 kg), SpO2 97 %.  General: No apparent distress alert and oriented x3 mood and affect normal, dressed appropriately.  HEENT: Pupils equal, extraocular movements intact  Respiratory: Patient's speak in full sentences and does not appear short of breath  Cardiovascular: No lower extremity edema, non tender, no erythema  Skin: Warm dry intact with no signs of  infection or rash on extremities or on axial skeleton.  Abdomen: Soft nontender  Neuro: Cranial nerves II through XII are intact, neurovascularly intact in all extremities with 2+ DTRs and 2+ pulses.  Lymph: No lymphadenopathy of posterior or anterior cervical chain or axillae bilaterally.  Gait ambulates with external rotation of the right leg. MSK:  Non tender with full range of motion and good stability and symmetric strength and tone of shoulders, elbows, wrist, hip, knee and ankles bilaterally. Arthritic changes of multiple joints.  Patient's hamstring is significantly tight on the right side than her left side. Patient does have findings that seemed to be somewhat consistent with a straight leg test. Patient is somewhat tender though over the hamstring itself. Painful to palpation over the origin on the ischial tuberosity. No defect noted in the muscle. Full strength compared to contralateral side.  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 Patient does have bunion formation on the right large toe causing fibular deviation. 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 He shouldn't does have hammertoe of the second toe on the right foot.    Impression and Recommendations:     This case required medical decision making of moderate complexity.

## 2015-04-21 NOTE — Patient Instructions (Addendum)
Good to see you Get xray downstairs for further evaluation of your back.  I will release it in my chart Ice 20 minutes 2 times daily. Usually after activity and before bed. Vitamin D 2000 IU daily Turmeric 500mg  twice daily Exercises for foot and hamstring but alternate and only 3 times a week  Change working position in your bedroom.  [employer] orthotics total support online Compression thigh sleeve for hamstring with activity could be helpful.  See me again in 4-6 weeks.  Happy holidays!

## 2015-04-26 ENCOUNTER — Other Ambulatory Visit (INDEPENDENT_AMBULATORY_CARE_PROVIDER_SITE_OTHER): Payer: PPO

## 2015-04-26 DIAGNOSIS — Z0000 Encounter for general adult medical examination without abnormal findings: Secondary | ICD-10-CM | POA: Diagnosis not present

## 2015-04-26 DIAGNOSIS — R7989 Other specified abnormal findings of blood chemistry: Secondary | ICD-10-CM | POA: Diagnosis not present

## 2015-04-26 LAB — CBC WITH DIFFERENTIAL/PLATELET
Basophils Absolute: 0 10*3/uL (ref 0.0–0.1)
Basophils Relative: 0.6 % (ref 0.0–3.0)
EOS ABS: 0.2 10*3/uL (ref 0.0–0.7)
Eosinophils Relative: 2.9 % (ref 0.0–5.0)
HCT: 41.6 % (ref 36.0–46.0)
HEMOGLOBIN: 13.9 g/dL (ref 12.0–15.0)
LYMPHS ABS: 2.4 10*3/uL (ref 0.7–4.0)
Lymphocytes Relative: 38.7 % (ref 12.0–46.0)
MCHC: 33.5 g/dL (ref 30.0–36.0)
MCV: 87.3 fl (ref 78.0–100.0)
MONO ABS: 0.6 10*3/uL (ref 0.1–1.0)
Monocytes Relative: 9.6 % (ref 3.0–12.0)
NEUTROS PCT: 48.2 % (ref 43.0–77.0)
Neutro Abs: 2.9 10*3/uL (ref 1.4–7.7)
Platelets: 406 10*3/uL — ABNORMAL HIGH (ref 150.0–400.0)
RBC: 4.77 Mil/uL (ref 3.87–5.11)
RDW: 12.9 % (ref 11.5–15.5)
WBC: 6.1 10*3/uL (ref 4.0–10.5)

## 2015-04-26 LAB — COMPREHENSIVE METABOLIC PANEL
ALBUMIN: 4.2 g/dL (ref 3.5–5.2)
ALK PHOS: 46 U/L (ref 39–117)
ALT: 13 U/L (ref 0–35)
AST: 14 U/L (ref 0–37)
BUN: 18 mg/dL (ref 6–23)
CHLORIDE: 104 meq/L (ref 96–112)
CO2: 31 mEq/L (ref 19–32)
CREATININE: 0.9 mg/dL (ref 0.40–1.20)
Calcium: 9.4 mg/dL (ref 8.4–10.5)
GFR: 65.3 mL/min (ref >60.00)
GLUCOSE: 110 mg/dL — AB (ref 70–99)
Potassium: 4.5 mEq/L (ref 3.5–5.1)
SODIUM: 141 meq/L (ref 135–145)
TOTAL PROTEIN: 7 g/dL (ref 6.0–8.3)
Total Bilirubin: 0.5 mg/dL (ref 0.2–1.2)

## 2015-04-26 LAB — LIPID PANEL
CHOL/HDL RATIO: 6
CHOLESTEROL: 224 mg/dL — AB (ref 0–200)
HDL: 40.7 mg/dL (ref >39.00)
NonHDL: 183.68
TRIGLYCERIDES: 296 mg/dL — AB (ref 0.0–149.0)
VLDL: 59.2 mg/dL — AB (ref 0.0–40.0)

## 2015-04-26 LAB — LDL CHOLESTEROL, DIRECT: Direct LDL: 128 mg/dL

## 2015-06-01 ENCOUNTER — Ambulatory Visit: Payer: PPO | Admitting: Family Medicine

## 2015-06-08 ENCOUNTER — Ambulatory Visit (INDEPENDENT_AMBULATORY_CARE_PROVIDER_SITE_OTHER): Payer: Medicare HMO | Admitting: Family Medicine

## 2015-06-08 ENCOUNTER — Encounter: Payer: Self-pay | Admitting: Family Medicine

## 2015-06-08 VITALS — BP 102/72 | HR 77 | Ht 67.0 in | Wt 206.0 lb

## 2015-06-08 DIAGNOSIS — S76311D Strain of muscle, fascia and tendon of the posterior muscle group at thigh level, right thigh, subsequent encounter: Secondary | ICD-10-CM

## 2015-06-08 DIAGNOSIS — M79671 Pain in right foot: Secondary | ICD-10-CM

## 2015-06-08 DIAGNOSIS — E669 Obesity, unspecified: Secondary | ICD-10-CM | POA: Diagnosis not present

## 2015-06-08 DIAGNOSIS — M79672 Pain in left foot: Secondary | ICD-10-CM

## 2015-06-08 DIAGNOSIS — M79651 Pain in right thigh: Secondary | ICD-10-CM | POA: Diagnosis not present

## 2015-06-08 NOTE — Progress Notes (Signed)
Pre visit review using our clinic review tool, if applicable. No additional management support is needed unless otherwise documented below in the visit note. 

## 2015-06-08 NOTE — Progress Notes (Signed)
[name] [place], [place] [address] Phone: [phone] Subjective:    I'm seeing this patient by the request  of:  Hoyt Koch, MD   CC: Right hamstring pain f/u bilateral foot pain follow-up  [id number] Kelly [name] is a 75 y.o. female coming in with complaint of right hamstring pain. Patient has been going to water aerobics and states that this has been somewhat helpful. Patient is wondering if she get more directional and what exercises would be better. Patient would like to do this on her own but feels like she needs which exercises she can do. Patient has been doing home exercises. Denies any worsening symptoms. States that the hamstring seems to be approximately 90% better.  Patient is also complaining of bilateral foot pain. Patient did bring custom orthotics. She has been wearing these. Been doing some of the different exercises to work on keeping the arch of her foot and stop the progression. Patient is been taking the vitamins. States that she has noticed some mild improvement. No worsening pain. Patient denies any new symptoms. Denies any swelling or any other significant changes. Patient also brought her tennis shoes.     Past Medical History  Diagnosis Date  . Hyperlipidemia   . Postmenopausal HRT (hormone replacement therapy)   . Hepatitis     CMV 1992  . Scarlet fever as a teen  . Breast mass     fibocystic breast dx  . Blood transfusion without reported diagnosis   . Allergy    Past Surgical History  Procedure Laterality Date  . Appendectomy    . Tonsillectomy    . Caesarean section    . Bunionectomy    . Foot tendon surgery      left   . Correction hammer toe    . Colonoscopy    . Polypectomy     Social History   Social History  . Marital Status: Single    Spouse Name: N/A  . Number of Children: 2  . Years of Education: 16   Occupational History  . 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     Social History Main Topics  . Smoking status: Never Smoker   . Smokeless tobacco: Never Used  . Alcohol Use: Yes     Comment: rarely will have a glass of wine  . Drug Use: No  . Sexual Activity:    Partners: Male   Other Topics Concern  . Not on file   Social History Narrative   [name]; [employer]'s [employer]. Lives alone and independently. Married '67--24 yrs. - widowed '92.  2 dtrs - '69, '73; 6 grandchildren. ACP/EoL - yes CPR, yes for short term ventilation, no prolonged heroic care in an irreversible state.    Allergies  Allergen Reactions  . Codeine     REACTION: nausea  . Amoxicillin Rash   Family History  Problem Relation Age of Onset  . Pancreatic cancer Mother   . Hyperlipidemia Mother   . Hypertension Mother   . Polymyalgia rheumatica Mother   . Dementia Father      Past medical history, social, surgical and family history all reviewed in electronic medical record.   Review of Systems: No headache, visual changes, nausea, vomiting, diarrhea, constipation, dizziness, abdominal pain, skin rash, fevers, chills, night sweats, weight loss, swollen lymph nodes, body aches, joint swelling, muscle aches, chest pain, shortness of breath, mood changes.  Objective Blood pressure 102/72, pulse 77, height 5\' 7"  (1.702 m), weight 206 lb (93.441 kg), SpO2 97 %.  General: No apparent distress alert and oriented x3 mood and affect normal, dressed appropriately.  HEENT: Pupils equal, extraocular movements intact  Respiratory: Patient's speak in full sentences and does not appear short of breath  Cardiovascular: No lower extremity edema, non tender, no erythema  Skin: Warm dry intact with no signs of infection or rash on extremities or on axial skeleton.  Abdomen: Soft nontender  Neuro: Cranial nerves II through XII are intact, neurovascularly intact in all extremities with 2+ DTRs and 2+ pulses.  Lymph: No lymphadenopathy of posterior  or anterior cervical chain or axillae bilaterally.  Gait ambulates with external rotation of the right leg. MSK:  Non tender with full range of motion and good stability and symmetric strength and tone of shoulders, elbows, wrist, hip, knee and ankles bilaterally. Arthritic changes of multiple joints.  Patient's hamstring  still has tightness but appears to be more bilateral. Mild improvement from previou mild tenderness of the pes anserine area bilaterally. No erythema noted though or any inflammation.  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 Patient does have bunion formation on the right large toe causing fibular deviation. 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 Nontender on exam    Impression and Recommendations:     This case required medical decision making of moderate complexity.

## 2015-06-08 NOTE — Assessment & Plan Note (Signed)
Encourage weight loss. 

## 2015-06-08 NOTE — Assessment & Plan Note (Signed)
Patient does have bilateral foot pain. I do think that patient's previous custom orthotics seems to be doing well. They seem to be holding up. I do not think that she needs a new pair. Encourage her though to get new shoes because she breath ones that she has in the think that they are contributing to some of the discomfort. I think this can help with some of the alignment and patient will do better overall. Patient and will come back in 4-6 weeks for further evaluation and treatment.

## 2015-06-08 NOTE — Assessment & Plan Note (Signed)
Patient has made some improvement. I do think that referring her to formal physical therapy and especially aquatic therapy so she can learn exercises would be beneficial. Patient is going to try this and we will see him if she makes some improvement. We did discuss the possibility of a compression sleeve that can be helpful. We discussed which activities would be better. Encourage her to wear the orthotics on a regular basis. Discussed the importance of new shoes that have better treadmill and she has now. I do think patient will do very well with conservative therapy. Patient will continue all other medications and will follow-up with me again in 4-6 weeks for further evaluation and treatment.  Spent  25 minutes with patient face-to-face and had greater than 50% of counseling including as described above in assessment and plan.

## 2015-06-08 NOTE — Patient Instructions (Signed)
Good to see you  Injected the side of the hip Some of the pain is from the the back though so may not be perfect Ice 20 minutes 2 times daily. Usually after activity and before bed. Get new shoes!!!! Good shoes with rigid bottom.  Kelly [name], [place] or New balance greater then 700 We will get you in to aquatic therapy for one visit Continue the vitamins See me again in 4-6 weeks.

## 2015-07-01 ENCOUNTER — Other Ambulatory Visit: Payer: Self-pay | Admitting: Internal Medicine

## 2015-07-13 ENCOUNTER — Ambulatory Visit: Payer: Medicare HMO | Admitting: Family Medicine

## 2015-07-22 ENCOUNTER — Encounter: Payer: Self-pay | Admitting: Family Medicine

## 2015-07-22 ENCOUNTER — Ambulatory Visit (INDEPENDENT_AMBULATORY_CARE_PROVIDER_SITE_OTHER): Payer: Medicare HMO | Admitting: Family Medicine

## 2015-07-22 VITALS — BP 100/64 | HR 94 | Ht 67.0 in | Wt 200.0 lb

## 2015-07-22 DIAGNOSIS — E669 Obesity, unspecified: Secondary | ICD-10-CM

## 2015-07-22 DIAGNOSIS — S76311D Strain of muscle, fascia and tendon of the posterior muscle group at thigh level, right thigh, subsequent encounter: Secondary | ICD-10-CM

## 2015-07-22 DIAGNOSIS — M79671 Pain in right foot: Secondary | ICD-10-CM

## 2015-07-22 DIAGNOSIS — M79672 Pain in left foot: Secondary | ICD-10-CM

## 2015-07-22 DIAGNOSIS — M79651 Pain in right thigh: Secondary | ICD-10-CM

## 2015-07-22 NOTE — Patient Instructions (Addendum)
Good to see you  Lace shoes differently Can take up to 1000mg  of turmeric at one time Love the shoes See me again when you need me!!!!

## 2015-07-22 NOTE — Progress Notes (Signed)
Pre visit review using our clinic review tool, if applicable. No additional management support is needed unless otherwise documented below in the visit note. 

## 2015-07-22 NOTE — Progress Notes (Signed)
[name] [place], [place] [address] Phone: [phone] Subjective:    I'm seeing this patient by the request  of:  Hoyt Koch, MD   CC: Right hamstring pain f/u bilateral foot pain follow-up  [id number] Kelly [name] is a 75 y.o. female coming in with complaint of right hamstring pain. Patient at last exam was doing approximately 90% better. Patient was encouraged to continue to do the water aerobics and continuing to do the exercises. Patient states She is no longer having any pain. Very happy with the results. Staying active and walking a considerable amount more.  Patient was seen previously and was diagnosed with more of a bilateral foot pain. She was given different exercises. Patient did have custom orthotics and encourage her to start doing more wearing them on a regular basis. Patient states the new shoes and the over-the-counter orthotics admitting drastic improvement. Patient actually states that she is not having as much pain. Walk long distances and not having the severe pain at night.  Patient was also having right thigh pain. There is concern for this pain more of a lumbar radiculopathy. Patient was sent to have x-rays. 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 Patient states it has completely resolved at this time.      Past Medical History  Diagnosis Date  . Hyperlipidemia   . Postmenopausal HRT (hormone replacement therapy)   . Hepatitis     CMV 1992  . Scarlet fever as a teen  . Breast mass     fibocystic breast dx  . Blood transfusion without reported diagnosis   . Allergy    Past Surgical History  Procedure Laterality Date  . Appendectomy    . Tonsillectomy    . Caesarean section    . Bunionectomy    . Foot tendon surgery      left   . Correction hammer toe     . Colonoscopy    . Polypectomy     Social History   Social History  . Marital Status: Single    Spouse Name: N/A  . Number of Children: 2  . Years of Education: 16   Occupational History  . 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     Social History Main Topics  . Smoking status: Never Smoker   . Smokeless tobacco: Never Used  . Alcohol Use: Yes     Comment: rarely will have a glass of wine  . Drug Use: No  . Sexual Activity:    Partners: Male   Other Topics Concern  . 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 Lives alone and independently. Married '67--24 yrs. - widowed '92.  2 dtrs - '69, '73; 6 grandchildren. ACP/EoL - yes CPR, yes for short term ventilation, no prolonged heroic care in an irreversible state.    Allergies  Allergen Reactions  . Codeine     REACTION: nausea  . Amoxicillin Rash   Family History  Problem Relation Age of Onset  . Pancreatic cancer Mother   . Hyperlipidemia Mother   . Hypertension Mother   . Polymyalgia rheumatica Mother   . Dementia Father      Past medical history, social, surgical and family history all reviewed in electronic medical record.  Review of Systems: No headache, visual changes, nausea, vomiting, diarrhea, constipation, dizziness, abdominal pain, skin rash, fevers, chills, night sweats, weight loss, swollen lymph nodes, body aches, joint swelling, muscle aches, chest pain, shortness of breath, mood changes.   Objective Blood pressure 100/64, pulse 94, height 5\' 7"  (1.702 m), weight 200 lb (90.719 kg), SpO2 97 %.  General: No apparent distress alert and oriented x3 mood and affect normal, dressed appropriately.  HEENT: Pupils equal, extraocular movements intact  Respiratory: Patient's speak in full sentences and does not appear short of breath  Cardiovascular: No lower extremity edema, non tender, no erythema  Skin: Warm dry intact with no signs of  infection or rash on extremities or on axial skeleton.  Abdomen: Soft nontender  Neuro: Cranial nerves II through XII are intact, neurovascularly intact in all extremities with 2+ DTRs and 2+ pulses.  Lymph: No lymphadenopathy of posterior or anterior cervical chain or axillae bilaterally.  Gait ambulates with external rotation of the right leg. MSK:  Non tender with full range of motion and good stability and symmetric strength and tone of shoulders, elbows, wrist, hip, knee and ankles bilaterally. Arthritic changes of multiple joints.  Hamstring is no longer tight. Patient has been good range of motion. Patient neurovascular intact distally with full strength.  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 Patient does have bunion formation; on the right large toe causing fibular deviation. 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 Nontender on exam No change from previous exam   Impression and Recommendations:     This case required medical decision making of moderate complexity.

## 2015-07-22 NOTE — Assessment & Plan Note (Signed)
Resolved

## 2015-07-22 NOTE — Assessment & Plan Note (Signed)
I believe the patient's malalignment of her feet were causing some of the discomfort that she was having. Patient is responding very well to conservative therapy. Discussed icing regimen. We discussed continuing good shoes with the rigid bottom.

## 2015-07-22 NOTE — Assessment & Plan Note (Signed)
Encourage weight loss. 

## 2015-07-30 ENCOUNTER — Other Ambulatory Visit: Payer: Self-pay | Admitting: Internal Medicine

## 2015-08-24 ENCOUNTER — Other Ambulatory Visit: Payer: Self-pay | Admitting: Internal Medicine

## 2015-09-18 ENCOUNTER — Other Ambulatory Visit: Payer: Self-pay | Admitting: Internal Medicine

## 2015-10-23 ENCOUNTER — Other Ambulatory Visit: Payer: Self-pay | Admitting: Internal Medicine

## 2015-12-23 ENCOUNTER — Ambulatory Visit (INDEPENDENT_AMBULATORY_CARE_PROVIDER_SITE_OTHER): Payer: Medicare HMO | Admitting: Internal Medicine

## 2015-12-23 ENCOUNTER — Encounter: Payer: Self-pay | Admitting: Internal Medicine

## 2015-12-23 VITALS — BP 112/70 | HR 71 | Temp 97.9°F | Resp 18 | Ht 67.0 in | Wt 199.0 lb

## 2015-12-23 DIAGNOSIS — G4733 Obstructive sleep apnea (adult) (pediatric): Secondary | ICD-10-CM

## 2015-12-23 DIAGNOSIS — R0683 Snoring: Secondary | ICD-10-CM | POA: Diagnosis not present

## 2015-12-23 DIAGNOSIS — Z9989 Dependence on other enabling machines and devices: Secondary | ICD-10-CM | POA: Insufficient documentation

## 2015-12-23 HISTORY — DX: Obstructive sleep apnea (adult) (pediatric): G47.33

## 2015-12-23 NOTE — Progress Notes (Signed)
Pre visit review using our clinic review tool, if applicable. No additional management support is needed unless otherwise documented below in the visit note. 

## 2015-12-23 NOTE — Progress Notes (Signed)
   Subjective:    Patient ID: Kelly [name], female    DOB: 11-25-1940, 75 y.o.   MRN: [id number]  HPI The patient is a 75 YO female coming in for reports of stopping breathing during recent conscious sedation procedure at her gyn office. She stopped breathing twice during procedure while lying flat. She lives alone but is known to be at least mild snoring. Not feeling drowsy during the day. Can nap during tv or movies but not while driving or other active activities. No morning headache. Normally a side sleeper at home.   Review of Systems  Constitutional: Negative for activity change, appetite change, fatigue, fever and unexpected weight change.  Respiratory: Positive for apnea. Negative for cough, chest tightness and shortness of breath.        Snoring  Cardiovascular: Negative for chest pain, palpitations and leg swelling.  Gastrointestinal: Negative for abdominal distention, abdominal pain, constipation, diarrhea and nausea.  Genitourinary:       Incontinence  Musculoskeletal: Negative for arthralgias and gait problem.  Skin: Negative.   Neurological: Negative for dizziness, weakness, light-headedness and headaches.      Objective:   Physical Exam  Constitutional: She is oriented to person, place, and time. She appears well-developed and well-nourished.  HENT:  Head: Normocephalic and atraumatic.  Eyes: EOM are normal.  Neck: Normal range of motion.  Cardiovascular: Normal rate, regular rhythm and intact distal pulses.   Pulmonary/Chest: Effort normal and breath sounds normal.  Abdominal: Soft.  Neurological: She is alert and oriented to person, place, and time. Coordination normal.  Skin: Skin is warm and dry.   Vitals:   12/23/15 1408  BP: 112/70  Pulse: 71  Resp: 18  Temp: 97.9 F (36.6 C)  TempSrc: Oral  SpO2: 97%  Weight: 199 lb (90.3 kg)  Height: 5\' 7"  (1.702 m)     Assessment & Plan:

## 2015-12-23 NOTE — Patient Instructions (Signed)
We have ordered the home sleep test to check for sleep apnea. It may be that you just need to sleep on your side. If you do not hear back from them in 2 weeks call our office to check.   Sleep Apnea  Sleep apnea is a sleep disorder characterized by abnormal pauses in breathing while you sleep. When your breathing pauses, the level of oxygen in your blood decreases. This causes you to move out of deep sleep and into light sleep. As a result, your quality of sleep is poor, and the system that carries your blood throughout your body (cardiovascular system) experiences stress. If sleep apnea remains untreated, the following conditions can develop:  High blood pressure (hypertension).  Coronary artery disease.  Inability to achieve or maintain an erection (impotence).  Impairment of your thought process (cognitive dysfunction). There are three types of sleep apnea: 1. Obstructive sleep apnea--Pauses in breathing during sleep because of a blocked airway. 2. Central sleep apnea--Pauses in breathing during sleep because the area of the brain that controls your breathing does not send the correct signals to the muscles that control breathing. 3. Mixed sleep apnea--A combination of both obstructive and central sleep apnea. RISK FACTORS The following risk factors can increase your risk of developing sleep apnea:  Being overweight.  Smoking.  Having narrow passages in your nose and throat.  Being of older age.  Being female.  Alcohol use.  Sedative and tranquilizer use.  Ethnicity. Among individuals younger than 35 years, African Americans are at increased risk of sleep apnea. SYMPTOMS   Difficulty staying asleep.  Daytime sleepiness and fatigue.  Loss of energy.  Irritability.  Loud, heavy snoring.  Morning headaches.  Trouble concentrating.  Forgetfulness.  Decreased interest in sex.  Unexplained sleepiness. DIAGNOSIS  In order to diagnose sleep apnea, your caregiver will  perform a physical examination. A sleep study done in the comfort of your own home may be appropriate if you are otherwise healthy. Your caregiver may also recommend that you spend the night in a sleep lab. In the sleep lab, several monitors record information about your heart, lungs, and brain while you sleep. Your leg and arm movements and blood oxygen level are also recorded. TREATMENT The following actions may help to resolve mild sleep apnea:  Sleeping on your side.   Using a decongestant if you have nasal congestion.   Avoiding the use of depressants, including alcohol, sedatives, and narcotics.   Losing weight and modifying your diet if you are overweight. There also are devices and treatments to help open your airway:  Oral appliances. These are custom-made mouthpieces that shift your lower jaw forward and slightly open your bite. This opens your airway.  Devices that create positive airway pressure. This positive pressure "splints" your airway open to help you breathe better during sleep. The following devices create positive airway pressure:  Continuous positive airway pressure (CPAP) device. The CPAP device creates a continuous level of air pressure with an air pump. The air is delivered to your airway through a mask while you sleep. This continuous pressure keeps your airway open.  Nasal expiratory positive airway pressure (EPAP) device. The EPAP device creates positive air pressure as you exhale. The device consists of single-use valves, which are inserted into each nostril and held in place by adhesive. The valves create very little resistance when you inhale but create much more resistance when you exhale. That increased resistance creates the positive airway pressure. This positive pressure while you  exhale keeps your airway open, making it easier to breath when you inhale again.  Bilevel positive airway pressure (BPAP) device. The BPAP device is used mainly in patients with  central sleep apnea. This device is similar to the CPAP device because it also uses an air pump to deliver continuous air pressure through a mask. However, with the BPAP machine, the pressure is set at two different levels. The pressure when you exhale is lower than the pressure when you inhale.  Surgery. Typically, surgery is only done if you cannot comply with less invasive treatments or if the less invasive treatments do not improve your condition. Surgery involves removing excess tissue in your airway to create a wider passage way.   This information is not intended to replace advice given to you by your health care provider. Make sure you discuss any questions you have with your health care provider.   Document Released: 04/21/2002 Document Revised: 05/22/2014 Document Reviewed: 09/07/2011 Elsevier Interactive Patient Education [employer].

## 2015-12-23 NOTE — Assessment & Plan Note (Signed)
With witnessed apnea during procedure while lying flat. Home sleep test ordered to evaluate for OSA.

## 2016-01-24 ENCOUNTER — Encounter: Payer: Self-pay | Admitting: Internal Medicine

## 2016-01-28 LAB — HM MAMMOGRAPHY

## 2016-02-03 ENCOUNTER — Telehealth: Payer: Self-pay | Admitting: Internal Medicine

## 2016-02-03 DIAGNOSIS — G4733 Obstructive sleep apnea (adult) (pediatric): Secondary | ICD-10-CM

## 2016-02-03 NOTE — Telephone Encounter (Signed)
Pt called stating she had sleep study done last month and she never heard anything from our office. Please give her a call back

## 2016-02-04 NOTE — Telephone Encounter (Signed)
Patient says they gave her a home kit. She has the name of the company at home. She will call me with the name of the company and I will call them to ask them to send a copy to [place]. Please get name of company from patient if she calls back.

## 2016-02-04 NOTE — Telephone Encounter (Signed)
Pt called and stated the name of the company was [facility] and it would have come in under [place]. Thanks.

## 2016-02-07 ENCOUNTER — Encounter: Payer: Self-pay | Admitting: Internal Medicine

## 2016-02-10 ENCOUNTER — Encounter: Payer: Self-pay | Admitting: Internal Medicine

## 2016-02-10 NOTE — Telephone Encounter (Signed)
Patient is calling this. Can you please follow up with her. Thank you.

## 2016-02-11 NOTE — Telephone Encounter (Signed)
Sleep study was faxed to the wrong number. I called and spoke to Kelly [name] He is sending a copy over to [place] and Dr. Sharlet Salina will review.

## 2016-02-16 NOTE — Telephone Encounter (Signed)
Patient has called back in regard.  She seems a little upset b/c she has not heard anything.  Please call patient back on cell at [phone].

## 2016-02-17 NOTE — Telephone Encounter (Signed)
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 Order placed please fax to [facility] (if she wants to use them).

## 2016-02-17 NOTE — Telephone Encounter (Signed)
Please review and advise. The fax came in yesterday and I put it in your folder. Thanks.

## 2016-02-17 NOTE — Telephone Encounter (Signed)
Spoke to patient. Dr. Sharlet Salina reviewed sleep study. Orders for cpap have been faxed to [facility].

## 2016-03-07 ENCOUNTER — Ambulatory Visit (INDEPENDENT_AMBULATORY_CARE_PROVIDER_SITE_OTHER): Payer: Medicare HMO

## 2016-03-07 DIAGNOSIS — Z23 Encounter for immunization: Secondary | ICD-10-CM | POA: Diagnosis not present

## 2016-04-18 ENCOUNTER — Ambulatory Visit (INDEPENDENT_AMBULATORY_CARE_PROVIDER_SITE_OTHER): Payer: Medicare HMO | Admitting: Internal Medicine

## 2016-04-18 ENCOUNTER — Encounter: Payer: Self-pay | Admitting: Internal Medicine

## 2016-04-18 ENCOUNTER — Other Ambulatory Visit (INDEPENDENT_AMBULATORY_CARE_PROVIDER_SITE_OTHER): Payer: Medicare HMO

## 2016-04-18 VITALS — BP 110/62 | HR 73 | Temp 97.7°F | Resp 14 | Ht 67.0 in | Wt 199.0 lb

## 2016-04-18 DIAGNOSIS — G4733 Obstructive sleep apnea (adult) (pediatric): Secondary | ICD-10-CM | POA: Diagnosis not present

## 2016-04-18 DIAGNOSIS — M21611 Bunion of right foot: Secondary | ICD-10-CM | POA: Diagnosis not present

## 2016-04-18 DIAGNOSIS — Z0000 Encounter for general adult medical examination without abnormal findings: Secondary | ICD-10-CM | POA: Diagnosis not present

## 2016-04-18 DIAGNOSIS — R7989 Other specified abnormal findings of blood chemistry: Secondary | ICD-10-CM | POA: Diagnosis not present

## 2016-04-18 DIAGNOSIS — E785 Hyperlipidemia, unspecified: Secondary | ICD-10-CM | POA: Diagnosis not present

## 2016-04-18 DIAGNOSIS — Z9989 Dependence on other enabling machines and devices: Secondary | ICD-10-CM

## 2016-04-18 HISTORY — DX: Bunion of right foot: M21.611

## 2016-04-18 LAB — LDL CHOLESTEROL, DIRECT: LDL DIRECT: 96 mg/dL

## 2016-04-18 LAB — COMPREHENSIVE METABOLIC PANEL
ALT: 13 U/L (ref 0–35)
AST: 14 U/L (ref 0–37)
Albumin: 4.2 g/dL (ref 3.5–5.2)
Alkaline Phosphatase: 51 U/L (ref 39–117)
BILIRUBIN TOTAL: 0.6 mg/dL (ref 0.2–1.2)
BUN: 18 mg/dL (ref 6–23)
CHLORIDE: 104 meq/L (ref 96–112)
CO2: 30 meq/L (ref 19–32)
CREATININE: 0.87 mg/dL (ref 0.40–1.20)
Calcium: 9.9 mg/dL (ref 8.4–10.5)
GFR: 67.72 mL/min (ref >60.00)
GLUCOSE: 108 mg/dL — AB (ref 70–99)
Potassium: 4.5 mEq/L (ref 3.5–5.1)
SODIUM: 142 meq/L (ref 135–145)
Total Protein: 7.4 g/dL (ref 6.0–8.3)

## 2016-04-18 LAB — CBC
HEMATOCRIT: 40.3 % (ref 36.0–46.0)
Hemoglobin: 13.9 g/dL (ref 12.0–15.0)
MCHC: 34.5 g/dL (ref 30.0–36.0)
MCV: 87.1 fl (ref 78.0–100.0)
Platelets: 398 10*3/uL (ref 150.0–400.0)
RBC: 4.63 Mil/uL (ref 3.87–5.11)
RDW: 13 % (ref 11.5–15.5)
WBC: 7 10*3/uL (ref 4.0–10.5)

## 2016-04-18 LAB — LIPID PANEL
CHOL/HDL RATIO: 4
Cholesterol: 181 mg/dL (ref 0–200)
HDL: 42.6 mg/dL (ref >39.00)
NONHDL: 138.24
Triglycerides: 214 mg/dL — ABNORMAL HIGH (ref 0.0–149.0)
VLDL: 42.8 mg/dL — ABNORMAL HIGH (ref 0.0–40.0)

## 2016-04-18 LAB — VITAMIN D 25 HYDROXY (VIT D DEFICIENCY, FRACTURES): VITD: 20.18 ng/mL — AB (ref 30.00–100.00)

## 2016-04-18 NOTE — Assessment & Plan Note (Signed)
Taking lipitor 40 mg daily and wants to see if she can go down to 20 mg daily. Checking lipid panel.

## 2016-04-18 NOTE — Assessment & Plan Note (Signed)
Referral to podiatry for evaluation of fixing this. Prior left bunionectomy.

## 2016-04-18 NOTE — Assessment & Plan Note (Signed)
Colonoscopy due 2024, pneumonia series completed. Shingles done. Flu shot already done this year. Mammogram up to date. Counseled on home safety and sun safety and mole surveillance. Given 10 year screening recommendations.

## 2016-04-18 NOTE — Progress Notes (Signed)
   Subjective:    Patient ID: Kelly [name], female    DOB: 1941/04/02, 75 y.o.   MRN: [id number]  HPI Here for medicare wellness and physical, no new complaints. Please see A/P for status and treatment of chronic medical problems.   Diet: heart healthy Physical activity: sedentary, active 2 times per week at [employer] Depression/mood screen: negative Hearing: moderate loss, previous hearing test with need for aids did not get due to cost Visual acuity: grossly normal, performs annual eye exam  ADLs: capable Fall risk: low Home safety: good Cognitive evaluation: intact to orientation, naming, recall and repetition EOL planning: adv directives discussed  I have personally reviewed and have noted 1. The patient's medical and social history - reviewed today no changes 2. Their use of alcohol, tobacco or illicit drugs 3. Their current medications and supplements 4. The patient's functional ability including ADL's, fall risks, home safety risks and hearing or visual impairment. 5. Diet and physical activities 6. Evidence for depression or mood disorders 7. Care team reviewed and updated (available in snapshot)  Review of Systems  Constitutional: Negative.   HENT: Negative.   Eyes: Negative.   Respiratory: Negative for cough, chest tightness and shortness of breath.   Cardiovascular: Negative for chest pain, palpitations and leg swelling.  Gastrointestinal: Negative for abdominal distention, abdominal pain, constipation, diarrhea, nausea and vomiting.  Musculoskeletal: Negative.   Skin: Negative.   Neurological: Negative.   Psychiatric/Behavioral: Negative.       Objective:   Physical Exam  Constitutional: She is oriented to person, place, and time. She appears well-developed and well-nourished.  Overweight  HENT:  Head: Normocephalic and atraumatic.  Eyes: EOM are normal.  Neck: Normal range of motion.  Cardiovascular: Normal rate and regular rhythm.   Pulmonary/Chest:  Effort normal and breath sounds normal. No respiratory distress. She has no wheezes. She has no rales.  Abdominal: Soft. Bowel sounds are normal. She exhibits no distension. There is no tenderness. There is no rebound.  Musculoskeletal: She exhibits no edema.  Neurological: She is alert and oriented to person, place, and time. Coordination normal.  Skin: Skin is warm and dry.  Psychiatric: She has a normal mood and affect.   Vitals:   04/18/16 0938  BP: 110/62  Pulse: 73  Resp: 14  Temp: 97.7 F (36.5 C)  TempSrc: Oral  SpO2: 97%  Weight: 199 lb (90.3 kg)  Height: 5\' 7"  (1.702 m)      Assessment & Plan:

## 2016-04-18 NOTE — Patient Instructions (Signed)
Consider a goal weight of 185 and then stay there.   Keep up the good work with the exercising.   We are checking the labs today and will call you back with the results.  Health Maintenance, Female Introduction Adopting a healthy lifestyle and getting preventive care can go a long way to promote health and wellness. Talk with your health care provider about what schedule of regular examinations is right for you. This is a good chance for you to check in with your provider about disease prevention and staying healthy. In between checkups, there are plenty of things you can do on your own. Experts have done a lot of research about which lifestyle changes and preventive measures are most likely to keep you healthy. Ask your health care provider for more information. Weight and diet Eat a healthy diet  Be sure to include plenty of vegetables, fruits, low-fat dairy products, and lean protein.  Do not eat a lot of foods high in solid fats, added sugars, or salt.  Get regular exercise. This is one of the most important things you can do for your health.  Most adults should exercise for at least 150 minutes each week. The exercise should increase your heart rate and make you sweat (moderate-intensity exercise).  Most adults should also do strengthening exercises at least twice a week. This is in addition to the moderate-intensity exercise. Maintain a healthy weight  Body mass index (BMI) is a measurement that can be used to identify possible weight problems. It estimates body fat based on height and weight. Your health care provider can help determine your BMI and help you achieve or maintain a healthy weight.  For females 61 years of age and older:  A BMI below 18.5 is considered underweight.  A BMI of 18.5 to 24.9 is normal.  A BMI of 25 to 29.9 is considered overweight.  A BMI of 30 and above is considered obese. Watch levels of cholesterol and blood lipids  You should start having  your blood tested for lipids and cholesterol at 73 years of age, then have this test every 5 years.  You may need to have your cholesterol levels checked more often if:  Your lipid or cholesterol levels are high.  You are older than 73 years of age.  You are at high risk for heart disease. Cancer screening Lung Cancer  Lung cancer screening is recommended for adults 82-70 years old who are at high risk for lung cancer because of a history of smoking.  A yearly low-dose CT scan of the lungs is recommended for people who:  Currently smoke.  Have quit within the past 15 years.  Have at least a 30-pack-year history of smoking. A pack year is smoking an average of one pack of cigarettes a day for 1 year.  Yearly screening should continue until it has been 15 years since you quit.  Yearly screening should stop if you develop a health problem that would prevent you from having lung cancer treatment. Breast Cancer  Practice breast self-awareness. This means understanding how your breasts normally appear and feel.  It also means doing regular breast self-exams. Let your health care provider know about any changes, no matter how small.  If you are in your 20s or 30s, you should have a clinical breast exam (CBE) by a health care provider every 1-3 years as part of a regular health exam.  If you are 61 or older, have a CBE every year. Also  consider having a breast X-ray (mammogram) every year.  If you have a family history of breast cancer, talk to your health care provider about genetic screening.  If you are at high risk for breast cancer, talk to your health care provider about having an MRI and a mammogram every year.  Breast cancer gene (BRCA) assessment is recommended for women who have family members with BRCA-related cancers. BRCA-related cancers include:  Breast.  Ovarian.  Tubal.  Peritoneal cancers.  Results of the assessment will determine the need for genetic  counseling and BRCA1 and BRCA2 testing. Cervical Cancer  Your health care provider may recommend that you be screened regularly for cancer of the pelvic organs (ovaries, uterus, and vagina). This screening involves a pelvic examination, including checking for microscopic changes to the surface of your cervix (Pap test). You may be encouraged to have this screening done every 3 years, beginning at age 21.  For women ages 31-65, health care providers may recommend pelvic exams and Pap testing every 3 years, or they may recommend the Pap and pelvic exam, combined with testing for human papilloma virus (HPV), every 5 years. Some types of HPV increase your risk of cervical cancer. Testing for HPV may also be done on women of any age with unclear Pap test results.  Other health care providers may not recommend any screening for nonpregnant women who are considered low risk for pelvic cancer and who do not have symptoms. Ask your health care provider if a screening pelvic exam is right for you.  If you have had past treatment for cervical cancer or a condition that could lead to cancer, you need Pap tests and screening for cancer for at least 20 years after your treatment. If Pap tests have been discontinued, your risk factors (such as having a new sexual partner) need to be reassessed to determine if screening should resume. Some women have medical problems that increase the chance of getting cervical cancer. In these cases, your health care provider may recommend more frequent screening and Pap tests. Colorectal Cancer  This type of cancer can be detected and often prevented.  Routine colorectal cancer screening usually begins at 73 years of age and continues through 73 years of age.  Your health care provider may recommend screening at an earlier age if you have risk factors for colon cancer.  Your health care provider may also recommend using home test kits to check for hidden blood in the stool.  A  small camera at the end of a tube can be used to examine your colon directly (sigmoidoscopy or colonoscopy). This is done to check for the earliest forms of colorectal cancer.  Routine screening usually begins at age 44.  Direct examination of the colon should be repeated every 5-10 years through 73 years of age. However, you may need to be screened more often if early forms of precancerous polyps or small growths are found. Skin Cancer  Check your skin from head to toe regularly.  Tell your health care provider about any new moles or changes in moles, especially if there is a change in a mole's shape or color.  Also tell your health care provider if you have a mole that is larger than the size of a pencil eraser.  Always use sunscreen. Apply sunscreen liberally and repeatedly throughout the day.  Protect yourself by wearing long sleeves, pants, a wide-brimmed hat, and sunglasses whenever you are outside. Heart disease, diabetes, and high blood pressure  High  blood pressure causes heart disease and increases the risk of stroke. High blood pressure is more likely to develop in:  People who have blood pressure in the high end of the normal range (130-139/85-89 mm Hg).  People who are overweight or obese.  People who are African American.  If you are 41-35 years of age, have your blood pressure checked every 3-5 years. If you are 51 years of age or older, have your blood pressure checked every year. You should have your blood pressure measured twice-once when you are at a hospital or clinic, and once when you are not at a hospital or clinic. Record the average of the two measurements. To check your blood pressure when you are not at a hospital or clinic, you can use:  An automated blood pressure machine at a pharmacy.  A home blood pressure monitor.  If you are between 39 years and 67 years old, ask your health care provider if you should take aspirin to prevent strokes.  Have regular  diabetes screenings. This involves taking a blood sample to check your fasting blood sugar level.  If you are at a normal weight and have a low risk for diabetes, have this test once every three years after 73 years of age.  If you are overweight and have a high risk for diabetes, consider being tested at a younger age or more often. Preventing infection Hepatitis B  If you have a higher risk for hepatitis B, you should be screened for this virus. You are considered at high risk for hepatitis B if:  You were born in a country where hepatitis B is common. Ask your health care provider which countries are considered high risk.  Your parents were born in a high-risk country, and you have not been immunized against hepatitis B (hepatitis B vaccine).  You have HIV or AIDS.  You use needles to inject street drugs.  You live with someone who has hepatitis B.  You have had sex with someone who has hepatitis B.  You get hemodialysis treatment.  You take certain medicines for conditions, including cancer, organ transplantation, and autoimmune conditions. Hepatitis C  Blood testing is recommended for:  Everyone born from [birth year] through 1963.  Anyone with known risk factors for hepatitis C. Sexually transmitted infections (STIs)  You should be screened for sexually transmitted infections (STIs) including gonorrhea and chlamydia if:  You are sexually active and are younger than 73 years of age.  You are older than 73 years of age and your health care provider tells you that you are at risk for this type of infection.  Your sexual activity has changed since you were last screened and you are at an increased risk for chlamydia or gonorrhea. Ask your health care provider if you are at risk.  If you do not have HIV, but are at risk, it may be recommended that you take a prescription medicine daily to prevent HIV infection. This is called pre-exposure prophylaxis (PrEP). You are considered at  risk if:  You are sexually active and do not regularly use condoms or know the HIV status of your partner(s).  You take drugs by injection.  You are sexually active with a partner who has HIV. Talk with your health care provider about whether you are at high risk of being infected with HIV. If you choose to begin PrEP, you should first be tested for HIV. You should then be tested every 3 months for as long as  you are taking PrEP. Pregnancy  If you are premenopausal and you may become pregnant, ask your health care provider about preconception counseling.  If you may become pregnant, take 400 to 800 micrograms (mcg) of folic acid every day.  If you want to prevent pregnancy, talk to your health care provider about birth control (contraception). Osteoporosis and menopause  Osteoporosis is a disease in which the bones lose minerals and strength with aging. This can result in serious bone fractures. Your risk for osteoporosis can be identified using a bone density scan.  If you are 94 years of age or older, or if you are at risk for osteoporosis and fractures, ask your health care provider if you should be screened.  Ask your health care provider whether you should take a calcium or vitamin D supplement to lower your risk for osteoporosis.  Menopause may have certain physical symptoms and risks.  Hormone replacement therapy may reduce some of these symptoms and risks. Talk to your health care provider about whether hormone replacement therapy is right for you. Follow these instructions at home:  Schedule regular health, dental, and eye exams.  Stay current with your immunizations.  Do not use any tobacco products including cigarettes, chewing tobacco, or electronic cigarettes.  If you are pregnant, do not drink alcohol.  If you are breastfeeding, limit how much and how often you drink alcohol.  Limit alcohol intake to no more than 1 drink per day for nonpregnant women. One drink  equals 12 ounces of beer, 5 ounces of wine, or 1 ounces of hard liquor.  Do not use street drugs.  Do not share needles.  Ask your health care provider for help if you need support or information about quitting drugs.  Tell your health care provider if you often feel depressed.  Tell your health care provider if you have ever been abused or do not feel safe at home. This information is not intended to replace advice given to you by your health care provider. Make sure you discuss any questions you have with your health care provider. Document Released: 11/14/2010 Document Revised: 10/07/2015 Document Reviewed: 02/02/2015  2017 Elsevier

## 2016-04-18 NOTE — Progress Notes (Signed)
Pre visit review using our clinic review tool, if applicable. No additional management support is needed unless otherwise documented below in the visit note. 

## 2016-04-18 NOTE — Assessment & Plan Note (Signed)
Using CPAP now and feeling more alert and awake during the day. Using >80% of the time. Sometimes sleeps only 4-5 hours per night.

## 2016-04-28 ENCOUNTER — Other Ambulatory Visit: Payer: Self-pay | Admitting: Internal Medicine

## 2016-05-16 ENCOUNTER — Ambulatory Visit (INDEPENDENT_AMBULATORY_CARE_PROVIDER_SITE_OTHER): Payer: Medicare HMO

## 2016-05-16 ENCOUNTER — Ambulatory Visit (INDEPENDENT_AMBULATORY_CARE_PROVIDER_SITE_OTHER): Payer: Medicare HMO | Admitting: Sports Medicine

## 2016-05-16 ENCOUNTER — Encounter: Payer: Self-pay | Admitting: Sports Medicine

## 2016-05-16 DIAGNOSIS — M2041 Other hammer toe(s) (acquired), right foot: Secondary | ICD-10-CM | POA: Diagnosis not present

## 2016-05-16 DIAGNOSIS — M21619 Bunion of unspecified foot: Secondary | ICD-10-CM

## 2016-05-16 DIAGNOSIS — M79671 Pain in right foot: Secondary | ICD-10-CM | POA: Diagnosis not present

## 2016-05-16 NOTE — Progress Notes (Signed)
Subjective: Kelly [name] is a 76 y.o. female patient who presents to office for evaluation of Right> Left bunion pain. Patient complains of progressive pain especially over the last year in the Right>Left foot that starts as pain over the bump with direct pressure and range of motion; patient now has difficulty fitting shoes comfortably.  Patient has also tried shoes and inserts. Patient denies any other pedal complaints.   Patient Active Problem List   Diagnosis Date Noted  . 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 OSA on CPAP 12/23/2015  . Urinary incontinence 03/31/2014  . Fibrocystic breast disease 06/06/2011  . Routine health maintenance 11/12/2010  . Obesity 09/11/2007  . Hyperlipidemia 06/10/2007    Current Outpatient Prescriptions on File Prior to Visit  Medication Sig Dispense Refill  . atorvastatin (LIPITOR) 40 MG tablet TAKE 1 TABLET BY MOUTH EVERY DAY 90 tablet 0  . estradiol (CLIMARA - DOSED IN MG/24 HR) 0.05 mg/24hr patch 1/2 patch placed on the skin weekly  7  . estradiol (ESTRACE) 0.1 MG/GM vaginal cream Place [id number] Applicatorfuls vaginally every Wednesday and Saturday. 42.5 g 1  . multivitamin (THERAGRAN) per tablet Take 1 tablet by mouth daily.      Marland Kitchen PREMARIN vaginal cream Place 1 Applicatorful vaginally 2 (two) times a week.     . progesterone (PROMETRIUM) 200 MG capsule Take 1 capsule (200 mg total) by mouth as directed. 12 days out of every 6 months.    . tretinoin (RETIN-A) 0.01 % gel Apply topically at bedtime. 45 g 6   No current facility-administered medications on file prior to visit.     Allergies  Allergen Reactions  . Codeine     REACTION: nausea  . Amoxicillin Rash    Objective:  General: Alert and oriented x3 in no acute distress  Dermatology: No open lesions bilateral lower extremities, no webspace macerations, no ecchymosis bilateral, all nails x 10 are well manicured. Reactive callus at right bunion.   Vascular: Dorsalis Pedis and  Posterior Tibial pedal pulses 1/4, Capillary Fill Time 3 seconds, (+) pedal hair growth bilateral, no edema bilateral lower extremities, Temperature gradient within normal limits.  Neurology: Gross sensation intact via light touch bilateral.  Musculoskeletal: Mild tenderness with palpation right>left bunion deformity, mild limitation or crepitus with range of motion, deformity reducible, tracking not trackbound, there is no 1st ray hypermobility noted bilateral, splay foot bilateral. Midtarsal, Subtalar joint, and ankle joint range of motion is within normal limits. On weightbearing exam, there is decreased 1st MTPJ rom Right>Left with functional limitus noted, there is medial arch collapse Right> Left on weightbearing, rearfoot slight valgus, forefoot slight abduction with HAV deformity supported on ground with second toe impingement and lesser hammertoe deformity noted.   Xrays  Right Foot    Impression: Intermetatarsal angle above normal limits grade 4 bunion with hammertoe. Splay foot. No other acute findings.        Assessment and Plan: Problem List Items Addressed This Visit    None    Visit Diagnoses    Right foot pain    -  Primary   Relevant Orders   DG Foot 2 Views Right   Bunion       Hammertoe of right foot           -Complete examination performed -Xrays reviewed -Discussed treatement options; discussed HAV deformity;conservative and Surgical management (BWO and HT repair); risks, benefits, alternatives discussed. All patient's questions answered. -Dispensed toe spacers and bunion shield for  right -Patient desires for [place] to check insurance coverage (vicki to call patient with this info) and states that she will come back in the spring to sign surgery consent and to plan things when she talks with her daughters since she lives at home alone -Recommend continue with good supportive shoes and inserts; added felt padding to custom inserts.  -Patient to return to office as needed  or sooner if condition worsens.  Landis Martins, DPM

## 2016-05-16 NOTE — Patient Instructions (Signed)
Hammer Toe Hammer toe is a change in the shape (a deformity) of your second, third, or fourth toe. The deformity causes the middle joint of your toe to stay bent. This causes pain, especially when you are wearing shoes. Hammer toe starts gradually. At first, the toe can be straightened. Gradually over time, the deformity becomes stiff and permanent. Early treatments to keep the toe straight may relieve pain. As the deformity becomes stiff and permanent, surgery may be needed to straighten the toe. What are the causes? Hammer toe is caused by abnormal bending of the toe joint that is closest to your foot. It happens gradually over time. This pulls on the muscles and connections (tendons) of the toe joint, making them weak and stiff. It is often related to wearing shoes that are too short or narrow and do not let your toes straighten. What increases the risk? You may be at greater risk for hammer toe if you:  Are female.  Are older.  Wear shoes that are too small.  Wear high-heeled shoes that pinch your toes.  Are a ballet dancer.  Have a second toe that is longer than your big toe (first toe).  Injure your foot or toe.  Have arthritis.  Have a family history of hammer toe.  Have a nerve or muscle disorder. What are the signs or symptoms? The main symptoms of this condition are pain and deformity of the toe. The pain is worse when wearing shoes, walking, or running. Other symptoms may include:  Corns or calluses over the bent part of the toe or between the toes.  Redness and a burning feeling on the toe.  An open sore that forms on the top of the toe.  Not being able to straighten the toe. How is this diagnosed? This condition is diagnosed based on your symptoms and a physical exam. During the exam, your health care provider will try to straighten your toe to see how stiff the deformity is. You may also have tests, such as:  A blood test to check for rheumatoid arthritis.  An  X-ray to show how severe the deformity is. How is this treated? Treatment for this condition will depend on how stiff the deformity is. Surgery is often needed. However, sometimes a hammer toe can be straightened without surgery. Treatments that do not involve surgery include:  Taping the toe into a straightened position.  Using pads and cushions to protect the toe (orthotics).  Wearing shoes that provide enough room for the toes.  Doing toe-stretching exercises at home.  Taking an NSAID to reduce pain and swelling. If these treatments do not help or the toe cannot be straightened, surgery is the next option. The most common surgeries used to straighten a hammer toe include:  Arthroplasty. In this procedure, part of the joint is removed, and that allows the toe to straighten.  Fusion. In this procedure, cartilage between the two bones of the joint is taken out and the bones are fused together into one longer bone.  Implantation. In this procedure, part of the bone is removed and replaced with an implant to let the toe move again.  Flexor tendon transfer. In this procedure, the tendons that curl the toes down (flexor tendons) are repositioned. Follow these instructions at home:  Take over-the-counter and prescription medicines only as told by your health care provider.  Do toe straightening and stretching exercises as told by your health care provider.  Keep all follow-up visits as told by   your health care provider. This is important. How is this prevented?  Wear shoes that give your toes enough room and do not cause pain.  Do not wear high-heeled shoes. Contact a health care provider if:  Your pain gets worse.  Your toe becomes red or swollen.  You develop an open sore on your toe. This information is not intended to replace advice given to you by your health care provider. Make sure you discuss any questions you have with your health care provider. Document Released:  04/28/2000 Document Revised: 11/19/2015 Document Reviewed: 08/25/2015 Elsevier Interactive Patient Education  2017 [place], Care After Refer to this sheet in the next few weeks. These instructions provide you with information about caring for yourself after your procedure. Your health care provider may also give you more specific instructions. Your treatment has been planned according to current medical practices, but problems sometimes occur. Call your health care provider if you have any problems or questions after your procedure. What can I expect after the procedure? After your procedure, it is typical to have the following:  Pain.  Swelling. Follow these instructions at home:  Take medicines only as directed by your health care provider.  Apply ice to the affected area:  Put ice in a plastic bag.  Place a towel between your skin and the bag.  Leave the ice on for 20 minutes, 2-3 times a day.  There are many different ways to close and cover an incision, including stitches (sutures), skin glue, and adhesive strips. Follow your health care provider's instructions about:  Incision care.  Bandage (dressing) changes and removal.  Incision closure removal.  Keep your foot raised (elevated) while you are resting.  You may need to wear a brace or a boot that keeps your foot in the proper position. Wear the boot or brace as directed by your health care provider.  Use crutches, canes, or walkers as directed by your health care provider.  Do not put weight on your foot until your health care provider approves.  Rest as needed. Follow your health care provider's instructions on when you can return to your usual activities, like walking and driving.  Do not wear high heels or tight-fitting shoes.  Do physical therapy exercises to strengthen your foot as directed by your health care provider. Contact a health care provider if:  You have increased bleeding  from the incision.  You have drainage, redness, swelling, or pain at your incision site.  You have a fever or chills.  You notice a bad smell coming from the incision or the dressing.  Your dressing gets wet or falls off.  Your calf begins to swell.  Your toes feel numb or stiff. Get help right away if:  You have a rash.  You have difficulty breathing. This information is not intended to replace advice given to you by your health care provider. Make sure you discuss any questions you have with your health care provider. Document Released: 11/18/2004 Document Revised: 10/07/2015 Document Reviewed: 12/17/2013 Elsevier Interactive Patient Education  2017 [employer].

## 2016-05-19 ENCOUNTER — Telehealth: Payer: Self-pay | Admitting: *Deleted

## 2016-05-19 NOTE — Telephone Encounter (Signed)
Pt states she is looking to have bunion, and hammertoe surgery with Dr. Cannon Kettle and she wanted to know if there were services offered for the 1st 4 weeks of her post op.

## 2016-05-22 DIAGNOSIS — G4733 Obstructive sleep apnea (adult) (pediatric): Secondary | ICD-10-CM | POA: Diagnosis not present

## 2016-05-23 NOTE — Telephone Encounter (Signed)
I am returning your call.  How can I help you.  "I don't want to schedule surgery yet.  I live alone so I want to make arrangements to have someone stay with me."  I don't know of anyone who can stay with you.  I can arrange for home health care to come out and do skilled nursing but I don't know of anyone else.  "I will have to see what I can arrange.  I'll give you a call back."

## 2016-06-10 DIAGNOSIS — G4733 Obstructive sleep apnea (adult) (pediatric): Secondary | ICD-10-CM | POA: Diagnosis not present

## 2016-06-13 DIAGNOSIS — R69 Illness, unspecified: Secondary | ICD-10-CM | POA: Diagnosis not present

## 2016-06-15 ENCOUNTER — Encounter: Payer: Self-pay | Admitting: Internal Medicine

## 2016-06-15 DIAGNOSIS — H04123 Dry eye syndrome of bilateral lacrimal glands: Secondary | ICD-10-CM | POA: Diagnosis not present

## 2016-06-15 DIAGNOSIS — H2513 Age-related nuclear cataract, bilateral: Secondary | ICD-10-CM | POA: Diagnosis not present

## 2016-06-15 DIAGNOSIS — H40013 Open angle with borderline findings, low risk, bilateral: Secondary | ICD-10-CM | POA: Diagnosis not present

## 2016-06-15 DIAGNOSIS — H25013 Cortical age-related cataract, bilateral: Secondary | ICD-10-CM | POA: Diagnosis not present

## 2016-06-22 DIAGNOSIS — G4733 Obstructive sleep apnea (adult) (pediatric): Secondary | ICD-10-CM | POA: Diagnosis not present

## 2016-07-11 DIAGNOSIS — G4733 Obstructive sleep apnea (adult) (pediatric): Secondary | ICD-10-CM | POA: Diagnosis not present

## 2016-07-27 DIAGNOSIS — Z0100 Encounter for examination of eyes and vision without abnormal findings: Secondary | ICD-10-CM | POA: Diagnosis not present

## 2016-08-05 ENCOUNTER — Other Ambulatory Visit: Payer: Self-pay | Admitting: Internal Medicine

## 2016-08-08 DIAGNOSIS — G4733 Obstructive sleep apnea (adult) (pediatric): Secondary | ICD-10-CM | POA: Diagnosis not present

## 2016-08-29 ENCOUNTER — Encounter: Payer: Self-pay | Admitting: Podiatry

## 2016-08-29 ENCOUNTER — Ambulatory Visit (INDEPENDENT_AMBULATORY_CARE_PROVIDER_SITE_OTHER): Payer: Medicare HMO | Admitting: Podiatry

## 2016-08-29 VITALS — BP 115/77 | HR 66

## 2016-08-29 DIAGNOSIS — L600 Ingrowing nail: Secondary | ICD-10-CM

## 2016-08-29 NOTE — Patient Instructions (Signed)
Remove bandage in.i 24 hours. Okay to soak in soft soap 1 tablespoon per quart of Water, apply topical antibiotic ointment, Band-Aid until healed Acetaminophen for pain control if needed  Reappoint at your request

## 2016-08-29 NOTE — Progress Notes (Signed)
   Subjective:    Patient ID: Kelly [name], female    DOB: 1941-03-09, 76 y.o.   MRN: [id number]  HPI   This patient presents today complaining of a four-month history of pain along the medial border of the fifth right toenail area when walking wearing shoes. Patient describes applying protective pads and shoe modification a continuous complaining of increasing pain around this area.   Review of Systems  All other systems reviewed and are negative.      Objective:   Physical Exam  Orientated 3  Vascular: DP and PT pulses 2/4 bilaterally Capillary reflex immediate bilaterally  Neurological: Sensation to monofilament wire and tuning fork intact bilaterally  Dermatological: Atrophic skin bilaterally The medial border of the fifth right toenails incurvated with a callused nail groove exquisitely tender to direct palpation..  Musculoskeletal: HAV right Hammertoe second right Varus rotation fifth right        Assessment & Plan:   Assessment: Satisfactory neurovascular status Ingrowing medial border fifth right toenail with associated callused nail groove  Plan: Discuss treatment options including repetitive debridement versus permanent nail removal of the medial border fifth right toe nail. Patient verbally consents to permanent correction. Fifth toe was blocked with 2 mL 50-50 mixture 2% plain Xylocaine and 0.5% plain Sensorcaine. Toe is prepped with Betadine exsanguinated. The medial border of the fifth right toenail and attach nail callus were excised and a phenol matricectomy performed. Wounds flushed with alcohol and antibiotic compression dressing applied. The tourniquet was released and spontaneous capillary fill time noted in the fifth right toe. Patient tolerated procedure without any difficulty. Postoperative oral reconstruction provided  Reappoint at patient's request

## 2016-09-08 DIAGNOSIS — G4733 Obstructive sleep apnea (adult) (pediatric): Secondary | ICD-10-CM | POA: Diagnosis not present

## 2016-09-19 DIAGNOSIS — R69 Illness, unspecified: Secondary | ICD-10-CM | POA: Diagnosis not present

## 2016-10-08 DIAGNOSIS — G4733 Obstructive sleep apnea (adult) (pediatric): Secondary | ICD-10-CM | POA: Diagnosis not present

## 2016-10-26 DIAGNOSIS — R69 Illness, unspecified: Secondary | ICD-10-CM | POA: Diagnosis not present

## 2016-10-30 DIAGNOSIS — Z0100 Encounter for examination of eyes and vision without abnormal findings: Secondary | ICD-10-CM | POA: Diagnosis not present

## 2016-11-08 DIAGNOSIS — G4733 Obstructive sleep apnea (adult) (pediatric): Secondary | ICD-10-CM | POA: Diagnosis not present

## 2016-11-10 DIAGNOSIS — G4733 Obstructive sleep apnea (adult) (pediatric): Secondary | ICD-10-CM | POA: Diagnosis not present

## 2016-11-24 DIAGNOSIS — R69 Illness, unspecified: Secondary | ICD-10-CM | POA: Diagnosis not present

## 2016-12-02 ENCOUNTER — Other Ambulatory Visit: Payer: Self-pay | Admitting: Internal Medicine

## 2016-12-08 DIAGNOSIS — G4733 Obstructive sleep apnea (adult) (pediatric): Secondary | ICD-10-CM | POA: Diagnosis not present

## 2016-12-12 DIAGNOSIS — R69 Illness, unspecified: Secondary | ICD-10-CM | POA: Diagnosis not present

## 2016-12-15 ENCOUNTER — Other Ambulatory Visit: Payer: Self-pay | Admitting: Internal Medicine

## 2016-12-19 DIAGNOSIS — G4733 Obstructive sleep apnea (adult) (pediatric): Secondary | ICD-10-CM | POA: Diagnosis not present

## 2017-01-30 DIAGNOSIS — Z1231 Encounter for screening mammogram for malignant neoplasm of breast: Secondary | ICD-10-CM | POA: Diagnosis not present

## 2017-01-30 LAB — HM MAMMOGRAPHY

## 2017-01-31 ENCOUNTER — Encounter: Payer: Self-pay | Admitting: Internal Medicine

## 2017-01-31 NOTE — Progress Notes (Signed)
Abstracted and sent to scan  

## 2017-02-10 ENCOUNTER — Other Ambulatory Visit: Payer: Self-pay | Admitting: Family

## 2017-02-13 DIAGNOSIS — Z79890 Hormone replacement therapy (postmenopausal): Secondary | ICD-10-CM | POA: Diagnosis not present

## 2017-02-13 DIAGNOSIS — Z780 Asymptomatic menopausal state: Secondary | ICD-10-CM | POA: Diagnosis not present

## 2017-02-13 DIAGNOSIS — Z01419 Encounter for gynecological examination (general) (routine) without abnormal findings: Secondary | ICD-10-CM | POA: Diagnosis not present

## 2017-02-17 ENCOUNTER — Encounter (HOSPITAL_COMMUNITY): Payer: Self-pay | Admitting: *Deleted

## 2017-02-17 ENCOUNTER — Emergency Department (HOSPITAL_COMMUNITY)
Admission: EM | Admit: 2017-02-17 | Discharge: 2017-02-17 | Disposition: A | Discharge status: Home / Self Care | Payer: Medicare HMO | Attending: Emergency Medicine | Admitting: Emergency Medicine

## 2017-02-17 ENCOUNTER — Emergency Department (HOSPITAL_COMMUNITY): Payer: Medicare HMO

## 2017-02-17 DIAGNOSIS — S0990XA Unspecified injury of head, initial encounter: Secondary | ICD-10-CM | POA: Diagnosis present

## 2017-02-17 DIAGNOSIS — M25571 Pain in right ankle and joints of right foot: Secondary | ICD-10-CM | POA: Insufficient documentation

## 2017-02-17 DIAGNOSIS — M542 Cervicalgia: Secondary | ICD-10-CM | POA: Diagnosis not present

## 2017-02-17 DIAGNOSIS — M545 Low back pain: Secondary | ICD-10-CM | POA: Insufficient documentation

## 2017-02-17 DIAGNOSIS — Y999 Unspecified external cause status: Secondary | ICD-10-CM | POA: Diagnosis not present

## 2017-02-17 DIAGNOSIS — T148XXA Other injury of unspecified body region, initial encounter: Secondary | ICD-10-CM | POA: Diagnosis not present

## 2017-02-17 DIAGNOSIS — S199XXA Unspecified injury of neck, initial encounter: Secondary | ICD-10-CM | POA: Diagnosis not present

## 2017-02-17 DIAGNOSIS — S3993XA Unspecified injury of pelvis, initial encounter: Secondary | ICD-10-CM | POA: Diagnosis not present

## 2017-02-17 DIAGNOSIS — S299XXA Unspecified injury of thorax, initial encounter: Secondary | ICD-10-CM | POA: Diagnosis not present

## 2017-02-17 DIAGNOSIS — S060X0A Concussion without loss of consciousness, initial encounter: Secondary | ICD-10-CM | POA: Diagnosis not present

## 2017-02-17 DIAGNOSIS — S99911A Unspecified injury of right ankle, initial encounter: Secondary | ICD-10-CM | POA: Diagnosis not present

## 2017-02-17 DIAGNOSIS — Y92410 Unspecified street and highway as the place of occurrence of the external cause: Secondary | ICD-10-CM | POA: Insufficient documentation

## 2017-02-17 DIAGNOSIS — Z79899 Other long term (current) drug therapy: Secondary | ICD-10-CM | POA: Insufficient documentation

## 2017-02-17 DIAGNOSIS — Y9389 Activity, other specified: Secondary | ICD-10-CM | POA: Insufficient documentation

## 2017-02-17 MED ORDER — ACETAMINOPHEN 325 MG PO TABS
650.0000 mg | ORAL_TABLET | Freq: Once | ORAL | Status: AC
  Administered 2017-02-17: 650 mg via ORAL
  Filled 2017-02-17: qty 2

## 2017-02-17 MED ORDER — CYCLOBENZAPRINE HCL 5 MG PO TABS: 5.0000 mg | ORAL_TABLET | Freq: Three times a day (TID) | ORAL | 0 refills | Status: DC | PRN

## 2017-02-17 NOTE — ED Notes (Signed)
Generalized pain and soreness
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
## 2017-02-17 NOTE — Discharge Instructions (Signed)
Expect dizziness and headaches for several days.   Take motrin 600 mg every 6 hrs for pain.   Take flexeril for muscle spasms.   See your doctor  Return to ER if you have worse headaches, neck pain, vomiting, back pain, trouble walking, numbness, weakness
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
## 2017-02-21 NOTE — Progress Notes (Deleted)
Subjective:   @VITALSMCOMMENTS @  Chief Complaint: Kelly [name], DOB: March 30, 1941, is a 76 y.o. female who presents for No chief complaint on file.   Injury date : *** Visit #: ***  History of Present Illness:   Patient's goals/priorities: ***  CLASS CURRENT GRADE COMMENTS                               Concussion Self-Reported Symptom Score Symptoms rated on a scale 1-6, in last 24 hours  Headache: ***    Nausea: ***  Vomiting: ***  Balance Difficulty: ***   Dizziness: ***  Fatigue: ***  Trouble Falling Asleep: ***   Sleep More Than Usual: ***  Sleep Less Than Usual: ***  Daytime Drowsiness: ***  Photophobia: ***  Phonophobia: ***  Feeling anxious: ***  Confused: ***  Irritability: ***  Sadness: ***  Nervousness: ***  Feeling More Emotional: ***  Numbness or Tingling: ***  Feeling Slowed Down: ***  Feeling Mentally Foggy: ***  Difficulty Concentrating: ***  Difficulty Remembering: ***  Visual Problems: ***  Neck Pain: ***  Tinnitus: ***   Total Symptom Score: *** Previous Symptom Score: ***  Review of Systems: Pertinent items are noted in HPI.  Review of History: Past Medical History: @PMHP @  Past Surgical History:  has a past surgical history that includes Appendectomy; Tonsillectomy; caesarean section; Bunionectomy; Foot Tendon Surgery; Correction hammer toe; Colonoscopy; and Polypectomy. Family History: family history includes Dementia in her father; Hyperlipidemia in her mother; Hypertension in her mother; Pancreatic cancer in her mother; Polymyalgia rheumatica in her mother. Social History:  reports that she has never smoked. She has never used smokeless tobacco. She reports that she drinks alcohol. She reports that she does not use drugs. Current Medications: has a current medication list which includes the following prescription(s): atorvastatin, atorvastatin, atorvastatin, cyclobenzaprine, estradiol, estradiol, multivitamin, premarin,  progesterone, and tretinoin. Allergies: is allergic to codeine and amoxicillin.  Objective:    Physical Examination There were no vitals filed for this visit. General appearance: alert, appears stated age and cooperative Head: Normocephalic, without obvious abnormality, atraumatic Eyes: conjunctivae/corneas clear. PERRL, EOM's intact. Fundi benign. Sclera anicteric. Lungs: clear to auscultation bilaterally and percussion Heart: regular rate and rhythm, S1, S2 normal, no murmur, click, rub or gallop Neurologic: CN 2-12 normal.  Sensation to pain, touch, and proprioception normal.  DTRs  normal in upper and lower extremities. No pathologic reflexes. Neg rhomberg, modified rhomberg, pronator drift, tandem gait, finger-to-nose; see post-concussion vestibular and oculomotor testing in chart Psychiatric: Oriented X3, intact recent and remote memory, judgement and insight, normal mood and affect  Concussion testing performed today:  Neurocognitive testing (ImPACT):  Baseline:*** Post #1: ***   Verbal Memory Composite *** (***%) *** (***%)   Visual Memory Composite *** (***%) *** (***%)   Visual Motor Speed Composite *** (***%) *** (***%)   Reaction Time Composite *** (***%) *** (***%)   Cognitive Efficiency Index *** ***     Vestibular Screening:   Pre VOMS  HA Score: *** Pre VOMS  Dizziness Score: ***   Headache  Dizziness  Smooth Pursuits *** ***  H. Saccades *** ***  V. Saccades *** ***  H. VOR *** ***  V. VOR *** ***  Visual Motor Sensitivity *** ***  Accommodation Right: *** cm Left: *** cm *** ***  Convergence: *** cm Divergence: *** cm *** ***   Balance Screen: ***  Additional testing performed today: { :28529}  Assessment:    No diagnosis found.  Kelly [name] presents with the following concussion subtypes. [] Cognitive [] Cervical [] Vestibular [] Ocular [] Migraine [] Anxiety/Mood   Plan:   Action/Discussion: Reviewed diagnosis, management options,  expected outcomes, and the reasons for scheduled and emergent follow-up. Questions were adequately answered. Patient expressed verbal understanding and agreement with the following plan.      Participation in school/work: Patient is cleared to return to work/school and activities of daily living without restrictions.  Patient is not cleared to return to work/school until further notice.  Patient may return to work/school on ***, with the following restrictions/supports:  Length of Day:  Please allow patient to use *** class as study hall in a quiet area.  Shortened school/work day: Recommend *** until ***.  Recommend core classes only.  Extra Time:  Take mental rest breaks during the day as needed. Check for return of symptoms when participating in any activities that require a significant amount of attention or concentration.  Allow extra time to complete tasks.  Please allow *** weeks to make up missed assignments, test, quizzes.  Visual/Vestibular Accommodations in School:  Allow patient to eat lunch in quiet environment with 1-2 classmates.  Allow patient to leave class 5 minutes before end of period to avoid busy/noisy hallway.  Please provide any supplemental learning materials (power points, lecture notes, handouts, etc) in minimum size 18 font and allow/provide any auditory supplements to learning when possible (books on tape, audio tape lectures, etc) to limit visual stress in the classroom.  Patient is cleared for auditory participation only. Patient is not cleared for homework, quizzes, or tests at this time.   Testing:  May begin taking tests/quizzes on *** with no more than one test/quiz per day.   No significant classroom or standardized testing until ***.  Home/Extracurricular:  Lessen work/homework load to allow adequate cognitive rest. Work *** minutes with intervals of *** minute breaks (total *** hours).  Limit visual stimulants including: driving,  watching television/movies, reading, using cell phone, etc. - to ensure relative visual cognitive rest. NOT cleared for video or phone games. May participate *** minutes with intervals of *** minute breaks (total *** hours).   Participation in physical activity: Patient is cleared to return to physical activity participation without restrictions.  Patient is not cleared for formal physical activity (includes physical education class, sports practices, sports games, weight training, etc) at this time.   However, we recommend that patient has 20-30 minutes of light cardiovascular activity daily, with NO risk of head injury (example: walking), staying below level of symptoms.  Recommend gradual progression to *** vestibular exercise (30-45 min/day) with no risk of head trauma while staying below level of symptoms.  See your exercise treatment menu for details.   Begin your exercise prescription on ____________ (see separate exercise prescription form)  Cleared for physical activity that poses NO RISK of head trauma.   Gradual return to physical activity under the supervision of a physician and/or athletic trainer  Once asymptomatic for 24 hours, patient may start Stage *** of CFPSM's { :10024} Exercise Progression Protocol. This is to be monitored by patient's { :28383}    Patient may start Stage *** of CFPSM's { :10024} Exercise Progression Protocol. This is to be monitored by patient's { :28383}.   Patient is not cleared for full contact activities, activities with risk of head trauma or unsupervised physical activity while participating in CFPSM's Exercise Progression. Check for return of symptoms (using Concussion Education Form Symptom List) when  participating in activity and 24 hours following. If symptoms return, patient to contact our office for further recommendations.    [place] Recommendations form has been given to patient allowing *** to progress patient back into full  participation.  Active Treatment Strategies:  Fueling your brain is important for recovery. It is essential to stay well hydrated, aiming for half of your body weight in fluid ounces per day (100 lbs = 50 oz). We also recommend eating breakfast to start your day and focus on a well-balanced diet containing lean protein, 'good' fats, and complex carbohydrates. See your nutrition / hydration handout for more details.   Quality sleep is vital in your concussion recovery. We encourage lots of sleep for the first 24-72 hours after injury but following this period it is important to regulate your sleep cycle. We encourage *** hours of quality sleep per night. See your sleep handout for more details and strategies to quality sleep.  IF NOT USING THE OPTIONS BELOW DELETE THEM  Treating your vestibular and visual dysfunction will decrease your recovery time and improve your symptoms. Begin your home vestibular exercise program as directed on your AVS.    Begin taking Amantadine medicine as directed.   Begin taking DHA supplement as directed.    Begin home exercise program for neck as directed.   Follow-up information:  Follow up appointment at [facility] in *** .   Call [facility] at [phone] at least 24 hours after completion of Stage 4 with status update.  Patient needs to arrive 30 minutes prior to appointment to complete the following tests: { :28378}.    Patient Education:  Reviewed with patient the risks (i.e, a repeat concussion, post-concussion syndrome, second-impact syndrome) of returning to play prior to complete resolution, and thoroughly reviewed the signs and symptoms of concussion.Reviewed need for complete resolution of all symptoms, with rest AND exertion, prior to return to play.  Reviewed red flags for urgent medical evaluation: worsening symptoms, nausea/vomiting, intractable headache, musculoskeletal changes, focal neurological  deficits.  [facility] Concussion Care Plan, which clearly outlines the plans stated above, was given to patient.  I was personally involved with the physical evaluation of and am in agreement with the assessment and treatment plan for this patient.  Greater than 50% of this encounter was spent in direct consultation with the patient in evaluation, counseling, and coordination of care. Duration of encounter: { :28531} minutes.  After Visit Summary printed out and provided to patient as appropriate.  This note is written by Lyndal Pulley, in the presence of and acting as the scribe of Lyndal Pulley, DO.

## 2017-02-22 ENCOUNTER — Ambulatory Visit: Payer: Medicare HMO | Admitting: Family Medicine

## 2017-02-22 ENCOUNTER — Encounter: Payer: Self-pay | Admitting: Family Medicine

## 2017-02-22 ENCOUNTER — Telehealth: Payer: Self-pay

## 2017-02-22 ENCOUNTER — Ambulatory Visit (INDEPENDENT_AMBULATORY_CARE_PROVIDER_SITE_OTHER): Payer: Medicare HMO | Admitting: Family Medicine

## 2017-02-22 DIAGNOSIS — S134XXA Sprain of ligaments of cervical spine, initial encounter: Secondary | ICD-10-CM

## 2017-02-22 DIAGNOSIS — Z23 Encounter for immunization: Secondary | ICD-10-CM

## 2017-02-22 MED ORDER — PREDNISONE 50 MG PO TABS: 50.0000 mg | ORAL_TABLET | Freq: Every day | ORAL | 0 refills | Status: DC
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
## 2017-02-23 ENCOUNTER — Inpatient Hospital Stay: Payer: Medicare HMO | Admitting: Internal Medicine
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
## 2017-03-01 ENCOUNTER — Encounter: Payer: Self-pay | Admitting: Family Medicine

## 2017-03-01 ENCOUNTER — Ambulatory Visit (INDEPENDENT_AMBULATORY_CARE_PROVIDER_SITE_OTHER): Payer: Medicare HMO | Admitting: Family Medicine

## 2017-03-01 VITALS — BP 100/70 | HR 74 | Ht 67.0 in | Wt 180.0 lb

## 2017-03-01 DIAGNOSIS — S134XXA Sprain of ligaments of cervical spine, initial encounter: Secondary | ICD-10-CM

## 2017-03-01 NOTE — Patient Instructions (Signed)
Good to see you  Fish oil 2 gras daily  Tart cherry extract any dose at night  CoQ10 400mg  daily  PT will be calling you to help with balance.  See me again in 3 weeks an dif not better then we will consider labs
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
## 2017-03-06 ENCOUNTER — Ambulatory Visit (INDEPENDENT_AMBULATORY_CARE_PROVIDER_SITE_OTHER): Payer: Medicare HMO | Admitting: Physical Therapy

## 2017-03-06 ENCOUNTER — Ambulatory Visit: Payer: Medicare HMO

## 2017-03-06 DIAGNOSIS — M542 Cervicalgia: Secondary | ICD-10-CM

## 2017-03-06 DIAGNOSIS — R2681 Unsteadiness on feet: Secondary | ICD-10-CM | POA: Diagnosis not present
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
## 2017-03-09 ENCOUNTER — Ambulatory Visit (INDEPENDENT_AMBULATORY_CARE_PROVIDER_SITE_OTHER): Payer: Medicare HMO | Admitting: Physical Therapy

## 2017-03-09 DIAGNOSIS — R2681 Unsteadiness on feet: Secondary | ICD-10-CM

## 2017-03-09 DIAGNOSIS — M542 Cervicalgia: Secondary | ICD-10-CM

## 2017-03-09 NOTE — Therapy (Signed)
[facility] [address] [place], [place], [address] Phone: [phone]   Fax:  [phone]  Physical Therapy Treatment  Patient Details  Name: Kelly [name] MRN: [id number] Date of Birth: [birth year] Referring Provider: Dr. Charlann Boxer  Encounter Date: 03/09/2017      PT End of Session - 03/09/17 1101    Visit Number 2   Number of Visits 12   Date for PT Re-Evaluation 04/17/17   Authorization Type [employer]   PT Start Time 1016   PT Stop Time 1105   PT Time Calculation (min) 49 min   Activity Tolerance Patient tolerated treatment well   Behavior During Therapy [facility] for tasks assessed/performed      Past Medical History:  Diagnosis Date  . Allergy   . Blood transfusion without reported diagnosis   . Breast mass    fibocystic breast dx  . Hepatitis    CMV 1992  . Hyperlipidemia   . Postmenopausal HRT (hormone replacement therapy)   . Scarlet fever as a teen    Past Surgical History:  Procedure Laterality Date  . APPENDECTOMY    . BUNIONECTOMY    . caesarean section    . COLONOSCOPY    . CORRECTION HAMMER TOE    . FOOT TENDON SURGERY     left   . POLYPECTOMY    . TONSILLECTOMY      There were no vitals filed for this visit.      Subjective Assessment - 03/09/17 1017    Subjective feels dizziness is better; still having some mild imbalance issues.     Patient Stated Goals improve balance   Currently in Pain? Yes   Pain Score 4    Pain Location Back   Pain Orientation Right   Pain Descriptors / Indicators Dull;Aching   Pain Type Acute pain   Pain Onset 1 to 4 weeks ago   Pain Frequency Constant   Aggravating Factors  constant   Pain Relieving Factors constant            [facility] PT Assessment - 03/09/17 1019      Functional Gait  Assessment   Gait assessed  Yes   Gait Level Surface Walks 20 ft, slow speed, abnormal gait pattern, evidence for imbalance or deviates 10-15 in outside of the 12 in  walkway width. Requires more than 7 sec to ambulate 20 ft.   Change in Gait Speed Able to change speed, demonstrates mild gait deviations, deviates 6-10 in outside of the 12 in walkway width, or no gait deviations, unable to achieve a major change in velocity, or uses a change in velocity, or uses an assistive device.   Gait with Horizontal Head Turns Performs head turns with moderate changes in gait velocity, slows down, deviates 10-15 in outside 12 in walkway width but recovers, can continue to walk.   Gait with Vertical Head Turns Performs task with moderate change in gait velocity, slows down, deviates 10-15 in outside 12 in walkway width but recovers, can continue to walk.   Gait and Pivot Turn Pivot turns safely in greater than 3 sec and stops with no loss of balance, or pivot turns safely within 3 sec and stops with mild imbalance, requires small steps to catch balance.   Step Over Obstacle Is able to step over one shoe box (4.5 in total height) without changing gait speed. No evidence of imbalance.   Gait with Narrow Base of Support Ambulates 4-7 steps.  Gait with Eyes Closed Walks 20 ft, slow speed, abnormal gait pattern, evidence for imbalance, deviates 10-15 in outside 12 in walkway width. Requires more than 9 sec to ambulate 20 ft.   Ambulating Backwards Walks 20 ft, slow speed, abnormal gait pattern, evidence for imbalance, deviates 10-15 in outside 12 in walkway width.   Steps Two feet to a stair, must use rail.   Total Score 13                     [facility] Adult PT Treatment/Exercise - 03/09/17 1037      Exercises   Exercises Neck;Lumbar     Neck Exercises: Supine   Neck Retraction 10 reps;5 secs     Lumbar Exercises: Stretches   Single Knee to Chest Stretch 3 reps;30 seconds   Lower Trunk Rotation 3 reps;30 seconds   Piriformis Stretch 3 reps;30 seconds     Neck Exercises: Stretches   Upper Trapezius Stretch 3 reps;30 seconds   Levator Stretch 3 reps;30 seconds                 PT Education - 03/09/17 1101    Education provided Yes   Education Details HEP   Person(s) Educated Patient   Methods Demonstration;Explanation;Handout   Comprehension Verbalized understanding;Returned demonstration;Need further instruction          PT Short Term Goals - 03/09/17 1102      PT SHORT TERM GOAL #1   Title perform FGA with appropriate goal to be written   Status Achieved           PT Long Term Goals - 03/09/17 1102      PT LONG TERM GOAL #1   Title independent with HEP   Status On-going   Target Date 04/17/17     PT LONG TERM GOAL #2   Title improve cervical ROM by at least 10 degrees all motions for improved mobility and IADLs   Status On-going   Target Date 04/17/17     PT LONG TERM GOAL #3   Title demonstrate improved vestibular input by standing without LOB on compliant surface with feet together and EC at least 30 seconds   Status On-going   Target Date 04/17/17     PT LONG TERM GOAL #4   Title improve FGA to >/= 22/30 for decreased fall risk and improved functional mobility   Status New   Target Date 04/17/17               Plan - 03/09/17 1102    Clinical Impression Statement Pt scored a 13/30 on FGA indicating high fall risk with dynamic mobiltiy.  Pt reports dizziness symptoms seem to be improving, with main c/o today of Rt sided back pain as well as neck pain limiting mobility and affecting balance.  Session today focued on addressing these deficits and provided HEP.  Will continue to benefit from PT to maximize function.   PT Treatment/Interventions ADLs/Self Care Home Management;Canalith Repostioning;Cryotherapy;Electrical Stimulation;Moist Heat;Ultrasound;Neuromuscular re-education;Balance training;Therapeutic exercise;Therapeutic activities;Functional mobility training;Stair training;Gait training;Patient/family education;Manual techniques;Passive range of motion;Vestibular;Visual/perceptual  remediation/compensation;Taping;Dry needling   PT Next Visit Plan review HEP, monitor vestibular symptoms and ?check orthostatics   Consulted and Agree with Plan of Care Patient      Patient will benefit from skilled therapeutic intervention in order to improve the following deficits and impairments:  Decreased balance, Impaired flexibility, Postural dysfunction, Decreased range of motion, Dizziness, Impaired perceived functional ability, Impaired vision/preception, Increased muscle spasms, Increased fascial  restricitons, Pain, Hypomobility  Visit Diagnosis: Cervicalgia  Unsteadiness on feet     Problem List Patient Active Problem List   Diagnosis Date Noted  . Whiplash 02/22/2017  . 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 OSA on CPAP 12/23/2015  . Urinary incontinence 03/31/2014  . Fibrocystic breast disease 06/06/2011  . Routine health maintenance 11/12/2010  . Obesity 09/11/2007  . Hyperlipidemia 06/10/2007      Laureen Abrahams, PT, DPT 03/09/17 11:06 AM    [facility] [place], [place], [address] Phone: [phone]   Fax:  [phone]  Name: Sairah Knobloch MRN: [id number] Date of Birth: 1940-09-02

## 2017-03-09 NOTE — Patient Instructions (Signed)
  Flexibility: Upper Trapezius Stretch   Gently grasp right side of head while reaching behind back with other hand. Tilt head away until a gentle stretch is felt. Hold ____ seconds. Repeat ____ times per set. Do ____ sets per session. Do ____ sessions per day.  http://orth.exer.us/340   Levator Stretch   Grasp seat or sit on hand on side to be stretched. Turn head toward other side and look down. Use hand on head to gently stretch neck in that position. Hold ____ seconds. Repeat on other side. Repeat ____ times. Do ____ sessions per day.  Supine Push    Take a deep breath and exhale while pushing the back of neck down on the bed. Hold __5__ seconds. Repeat _10___ times. Do __1-2__ sessions per day.   Knee to Chest (Flexion)    Pull knee toward chest. Feel stretch in lower back or buttock area. Breathing deeply, Hold __30__ seconds. Repeat with other knee. Repeat __2-3__ times. Do __2-3__ sessions per day.   Piriformis (Supine)   Cross legs, right on top. Gently pull other knee toward chest until stretch is felt in buttock/hip of top leg. Hold _30___ seconds. Repeat ___3_ times per set.  Do __2__ sessions per day.   Lower Trunk Rotation Stretch   Keeping back flat and feet together, rotate knees to left side. Hold ___30_ seconds. Repeat __3_ times per set. . Do __2__ sessions per day.    Hamstring Step 2   Left foot relaxed, knee straight, other leg bent, foot flat. Raise straight leg further upward to maximal range. Hold _30__ seconds. Relax leg completely down. Repeat __2-3_ times.

## 2017-03-13 ENCOUNTER — Ambulatory Visit (INDEPENDENT_AMBULATORY_CARE_PROVIDER_SITE_OTHER): Payer: Medicare HMO | Admitting: Physical Therapy

## 2017-03-13 DIAGNOSIS — R2681 Unsteadiness on feet: Secondary | ICD-10-CM | POA: Diagnosis not present

## 2017-03-13 DIAGNOSIS — M542 Cervicalgia: Secondary | ICD-10-CM

## 2017-03-13 NOTE — Therapy (Signed)
[facility] [address] [place], [place], [address] Phone: [phone]   Fax:  [phone]  Physical Therapy Treatment  Patient Details  Name: Kelly [name] MRN: [id number] Date of Birth: 1940-08-25 Referring Provider: Dr. Charlann Boxer  Encounter Date: 03/13/2017      PT End of Session - 03/13/17 1514    Visit Number 3   Number of Visits 12   Date for PT Re-Evaluation 04/17/17   Authorization Type [employer] Medicare   PT Start Time 1430   PT Stop Time 1510   PT Time Calculation (min) 40 min   Activity Tolerance Patient tolerated treatment well   Behavior During Therapy [facility] for tasks assessed/performed      Past Medical History:  Diagnosis Date  . Allergy   . Blood transfusion without reported diagnosis   . Breast mass    fibocystic breast dx  . Hepatitis    CMV 1992  . Hyperlipidemia   . Postmenopausal HRT (hormone replacement therapy)   . Scarlet fever as a teen    Past Surgical History:  Procedure Laterality Date  . APPENDECTOMY    . BUNIONECTOMY    . caesarean section    . COLONOSCOPY    . CORRECTION HAMMER TOE    . FOOT TENDON SURGERY     left   . POLYPECTOMY    . TONSILLECTOMY      There were no vitals filed for this visit.      Subjective Assessment - 03/13/17 1431    Subjective has been doing exercises and feels like she's seen an improvement.  feels like she can move a little better.   Patient Stated Goals improve balance   Currently in Pain? Yes   Pain Score 5    Pain Location Neck   Pain Orientation Right   Pain Descriptors / Indicators Aching;Dull   Pain Type Acute pain   Pain Onset 1 to 4 weeks ago   Pain Frequency Intermittent   Aggravating Factors  only has pain with rotation to Rt   Pain Relieving Factors avoiding rotation                         [facility] Adult PT Treatment/Exercise - 03/13/17 1435      Neck Exercises: Supine   Neck Retraction 10 reps;5 secs     Lumbar  Exercises: Stretches   Single Knee to Chest Stretch 3 reps;30 seconds   Lower Trunk Rotation 3 reps;30 seconds   Piriformis Stretch 3 reps;30 seconds     Manual Therapy   Manual Therapy Soft tissue mobilization   Soft tissue mobilization Rt cervical paraspinals, upper trap, levator scapula and rhomboids     Neck Exercises: Stretches   Upper Trapezius Stretch 3 reps;30 seconds   Levator Stretch 3 reps;30 seconds                PT Education - 03/13/17 [place]    Education provided Yes   Education Details DN   Person(s) Educated Patient   Methods Explanation;Demonstration;Handout   Comprehension Verbalized understanding;Returned demonstration;Need further instruction          PT Short Term Goals - 03/09/17 1102      PT SHORT TERM GOAL #1   Title perform FGA with appropriate goal to be written   Status Achieved           PT Long Term Goals - 03/09/17 1102      PT LONG  TERM GOAL #1   Title independent with HEP   Status On-going   Target Date 04/17/17     PT LONG TERM GOAL #2   Title improve cervical ROM by at least 10 degrees all motions for improved mobility and IADLs   Status On-going   Target Date 04/17/17     PT LONG TERM GOAL #3   Title demonstrate improved vestibular input by standing without LOB on compliant surface with feet together and EC at least 30 seconds   Status On-going   Target Date 04/17/17     PT LONG TERM GOAL #4   Title improve FGA to >/= 22/30 for decreased fall risk and improved functional mobility   Status New   Target Date 04/17/17               Plan - 03/13/17 1514    Clinical Impression Statement Pt reports improvement in pain and symptoms since performing HEP, but cotninues to c/o increased muscle tightness.  Feel pt will be a good candidate for DN and discussed with pt today.  Will continue to benefit from PT to maximize function.   PT Treatment/Interventions ADLs/Self Care Home Management;Canalith  Repostioning;Cryotherapy;Electrical Stimulation;Moist Heat;Ultrasound;Neuromuscular re-education;Balance training;Therapeutic exercise;Therapeutic activities;Functional mobility training;Stair training;Gait training;Patient/family education;Manual techniques;Passive range of motion;Vestibular;Visual/perceptual remediation/compensation;Taping;Dry needling   PT Next Visit Plan monitor vestibular symptoms and ?check orthostatics; DN to upper trap/cervical muscles/rhomboids/levator; consider lumbar and hip muscles as well   Consulted and Agree with Plan of Care Patient      Patient will benefit from skilled therapeutic intervention in order to improve the following deficits and impairments:  Decreased balance, Impaired flexibility, Postural dysfunction, Decreased range of motion, Dizziness, Impaired perceived functional ability, Impaired vision/preception, Increased muscle spasms, Increased fascial restricitons, Pain, Hypomobility  Visit Diagnosis: Cervicalgia  Unsteadiness on feet     Problem List Patient Active Problem List   Diagnosis Date Noted  . Whiplash 02/22/2017  . 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 OSA on CPAP 12/23/2015  . Urinary incontinence 03/31/2014  . Fibrocystic breast disease 06/06/2011  . Routine health maintenance 11/12/2010  . Obesity 09/11/2007  . Hyperlipidemia 06/10/2007      Laureen Abrahams, PT, DPT 03/13/17 3:16 PM    [facility] [place], [place], [address] Phone: [phone]   Fax:  [phone]  Name: Kelly [name] MRN: [id number] Date of Birth: 1941/01/22

## 2017-03-13 NOTE — Patient Instructions (Signed)

## 2017-03-16 ENCOUNTER — Ambulatory Visit (INDEPENDENT_AMBULATORY_CARE_PROVIDER_SITE_OTHER): Payer: Medicare HMO | Admitting: Physical Therapy

## 2017-03-16 DIAGNOSIS — M542 Cervicalgia: Secondary | ICD-10-CM | POA: Diagnosis not present

## 2017-03-16 DIAGNOSIS — R2681 Unsteadiness on feet: Secondary | ICD-10-CM | POA: Diagnosis not present

## 2017-03-16 NOTE — Patient Instructions (Signed)
  TARGET BALL:  Turn towards the main section of the store and head towards the cards.  Turn at the cards and head towards the back of the store.  Just past the home goods before you get to the toys you will see an end cap with "party favor" type toys.  The ball is located in one of these bins.  Look for the softball sized ball that lights up.  It should be around $5.

## 2017-03-16 NOTE — Therapy (Signed)
[facility] [address] [place], [place], [address] Phone: [phone]   Fax:  [phone]  Physical Therapy Treatment  Patient Details  Name: Kelly [name] MRN: [id number] Date of Birth: May 22, 1940 Referring Provider: Dr. Charlann Boxer  Encounter Date: 03/16/2017      PT End of Session - 03/16/17 0839    Visit Number 4   Number of Visits 12   Date for PT Re-Evaluation 04/17/17   Authorization Type [employer]na Medicare   PT Start Time 0800   PT Stop Time 0850   PT Time Calculation (min) 50 min   Activity Tolerance Patient tolerated treatment well   Behavior During Therapy [facility] for tasks assessed/performed      Past Medical History:  Diagnosis Date  . Allergy   . Blood transfusion without reported diagnosis   . Breast mass    fibocystic breast dx  . Hepatitis    CMV 1992  . Hyperlipidemia   . Postmenopausal HRT (hormone replacement therapy)   . Scarlet fever as a teen    Past Surgical History:  Procedure Laterality Date  . APPENDECTOMY    . BUNIONECTOMY    . caesarean section    . COLONOSCOPY    . CORRECTION HAMMER TOE    . FOOT TENDON SURGERY     left   . POLYPECTOMY    . TONSILLECTOMY      There were no vitals filed for this visit.      Subjective Assessment - 03/16/17 0759    Subjective neck and back are still tight.  feels like dizziness and lightheadedness is "rare" right now.  wants to try DN today, requesting low back area today since it seems to be more aggravated.   Patient Stated Goals improve balance   Multiple Pain Sites Yes   Pain Score 6   Pain Location Buttocks   Pain Orientation Lower;Right;Posterior   Pain Descriptors / Indicators Sore;Tightness   Pain Type Chronic pain;Acute pain   Pain Onset More than a month ago   Pain Frequency Intermittent   Aggravating Factors  transitional movements                          [facility] Adult PT Treatment/Exercise - 03/16/17 0827       Self-Care   Self-Care Other Self-Care Comments   Other Self-Care Comments  use of ball for myofascial release     Modalities   Modalities Electrical Stimulation;Moist Heat     Moist Heat Therapy   Number Minutes Moist Heat 15 Minutes   Moist Heat Location Hip     Electrical Stimulation   Electrical Stimulation Location Rt buttock   Electrical Stimulation Action IFC   Electrical Stimulation Parameters to tolerance x 15 min   Electrical Stimulation Goals Pain     Manual Therapy   Manual Therapy Soft tissue mobilization   Soft tissue mobilization IASTM and trigger point release to glute med/max          Trigger Point Dry Needling - 03/16/17 0827    Consent Given? Yes   Education Handout Provided Yes   Muscles Treated Lower Body Gluteus maximus   Gluteus Maximus Response Twitch response elicited;Palpable increased muscle length  and glute med              PT Education - 03/16/17 0838    Education provided Yes   Education Details use of ball for myofascial release   Person(s) Educated  Patient   Methods Explanation;Demonstration   Comprehension Verbalized understanding;Returned demonstration          PT Short Term Goals - 03/09/17 1102      PT SHORT TERM GOAL #1   Title perform FGA with appropriate goal to be written   Status Achieved           PT Long Term Goals - 03/09/17 1102      PT LONG TERM GOAL #1   Title independent with HEP   Status On-going   Target Date 04/17/17     PT LONG TERM GOAL #2   Title improve cervical ROM by at least 10 degrees all motions for improved mobility and IADLs   Status On-going   Target Date 04/17/17     PT LONG TERM GOAL #3   Title demonstrate improved vestibular input by standing without LOB on compliant surface with feet together and EC at least 30 seconds   Status On-going   Target Date 04/17/17     PT LONG TERM GOAL #4   Title improve FGA to >/= 22/30 for decreased fall risk and improved functional mobility    Status New   Target Date 04/17/17               Plan - 03/16/17 0839    Clinical Impression Statement Pt tolerated DN well with twitch responses noted in both glute max and med.  Will continue to benefit from PT to maximize function.   PT Treatment/Interventions ADLs/Self Care Home Management;Canalith Repostioning;Cryotherapy;Electrical Stimulation;Moist Heat;Ultrasound;Neuromuscular re-education;Balance training;Therapeutic exercise;Therapeutic activities;Functional mobility training;Stair training;Gait training;Patient/family education;Manual techniques;Passive range of motion;Vestibular;Visual/perceptual remediation/compensation;Taping;Dry needling   PT Next Visit Plan monitor vestibular symptoms and ?check orthostatics; DN to upper trap/cervical muscles/rhomboids/levator; assess response to DN of glute muscles   Consulted and Agree with Plan of Care Patient      Patient will benefit from skilled therapeutic intervention in order to improve the following deficits and impairments:  Decreased balance, Impaired flexibility, Postural dysfunction, Decreased range of motion, Dizziness, Impaired perceived functional ability, Impaired vision/preception, Increased muscle spasms, Increased fascial restricitons, Pain, Hypomobility  Visit Diagnosis: Cervicalgia  Unsteadiness on feet     Problem List Patient Active Problem List   Diagnosis Date Noted  . Whiplash 02/22/2017  . 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 OSA on CPAP 12/23/2015  . Urinary incontinence 03/31/2014  . Fibrocystic breast disease 06/06/2011  . Routine health maintenance 11/12/2010  . Obesity 09/11/2007  . Hyperlipidemia 06/10/2007      Laureen Abrahams, PT, DPT 03/16/17 8:41 AM    [facility] [place], [place], [address] Phone: [phone]   Fax:  [phone]  Name: Kelly [name] MRN: [id number] Date of Birth: 10-Jun-1940

## 2017-03-19 ENCOUNTER — Ambulatory Visit (INDEPENDENT_AMBULATORY_CARE_PROVIDER_SITE_OTHER): Payer: Medicare HMO | Admitting: Physical Therapy

## 2017-03-19 ENCOUNTER — Encounter: Payer: Self-pay | Admitting: Physical Therapy

## 2017-03-19 DIAGNOSIS — R2681 Unsteadiness on feet: Secondary | ICD-10-CM

## 2017-03-19 DIAGNOSIS — M542 Cervicalgia: Secondary | ICD-10-CM | POA: Diagnosis not present

## 2017-03-19 NOTE — Therapy (Signed)
[facility] [address] [place], [place], [address] Phone: [phone]   Fax:  [phone]  Physical Therapy Treatment  Patient Details  Name: Kelly [name] MRN: [id number] Date of Birth: [birth year] Referring Provider: Dr. Charlann Boxer   Encounter Date: 03/19/2017  PT End of Session - 03/19/17 1143    Visit Number  5    Number of Visits  12    Date for PT Re-Evaluation  04/17/17    Authorization Type  Aetna Medicare    PT Start Time  1101    PT Stop Time  1151    PT Time Calculation (min)  50 min    Activity Tolerance  Patient tolerated treatment well    Behavior During Therapy  [facility] for tasks assessed/performed       Past Medical History:  Diagnosis Date  . Allergy   . Blood transfusion without reported diagnosis   . Breast mass    fibocystic breast dx  . Hepatitis    CMV 1992  . Hyperlipidemia   . Postmenopausal HRT (hormone replacement therapy)   . Scarlet fever as a teen    Past Surgical History:  Procedure Laterality Date  . APPENDECTOMY    . BUNIONECTOMY    . caesarean section    . COLONOSCOPY    . CORRECTION HAMMER TOE    . FOOT TENDON SURGERY     left   . POLYPECTOMY    . TONSILLECTOMY      There were no vitals filed for this visit.  Subjective Assessment - 03/19/17 1103    Subjective  weekend was pretty good with minimal pain; went walking around the track at the gym today and did some stretches and now Rt hip and back is sore and uncomfortable.  neck is still a little sore and painful but feels motions is improving.    Patient Stated Goals  improve balance    Pain Score  6     Pain Location  Neck    Pain Orientation  Right    Pain Descriptors / Indicators  Aching;Dull    Pain Type  Acute pain    Pain Onset  1 to 4 weeks ago    Pain Frequency  Intermittent    Pain Score  5    Pain Location  Back    Pain Orientation  Right;Lower;Posterior    Pain Descriptors / Indicators  Other (Comment) bruised    bruised   Pain Type  Chronic pain;Acute pain    Pain Onset  More than a month ago    Pain Frequency  Intermittent    Aggravating Factors   certain movements                      [facility] Adult PT Treatment/Exercise - 03/19/17 0001      Modalities   Modalities  Electrical Stimulation;Moist Heat      Moist Heat Therapy   Number Minutes Moist Heat  12 Minutes    Moist Heat Location  Cervical;Hip      Electrical Stimulation   Electrical Stimulation Location  cervical/upper thoracic spine    Electrical Stimulation Action  IFC    Electrical Stimulation Parameters  to tolerance x 15 min    Electrical Stimulation Goals  Pain      Manual Therapy   Manual Therapy  Soft tissue mobilization    Soft tissue mobilization  IASTM and trigger point release to bil upper trap  Trigger Point Dry Needling - 03/19/17 1143    Consent Given?  Yes    Muscles Treated Upper Body  Upper trapezius    Upper Trapezius Response  Twitch reponse elicited;Palpable increased muscle length             PT Short Term Goals - 03/09/17 1102      PT SHORT TERM GOAL #1   Title  perform FGA with appropriate goal to be written    Status  Achieved        PT Long Term Goals - 03/09/17 1102      PT LONG TERM GOAL #1   Title  independent with HEP    Status  On-going    Target Date  04/17/17      PT LONG TERM GOAL #2   Title  improve cervical ROM by at least 10 degrees all motions for improved mobility and IADLs    Status  On-going    Target Date  04/17/17      PT LONG TERM GOAL #3   Title  demonstrate improved vestibular input by standing without LOB on compliant surface with feet together and EC at least 30 seconds    Status  On-going    Target Date  04/17/17      PT LONG TERM GOAL #4   Title  improve FGA to >/= 22/30 for decreased fall risk and improved functional mobility    Status  New    Target Date  04/17/17            Plan - 03/19/17 1144    Clinical Impression  Statement  Pt tolerated DN to bil UT well with decreased soreness and tightness following DN and manual.  Pt continues to be limited by pain in low back and neck, but improving daily.  No c/o dizziness today.    PT Treatment/Interventions  ADLs/Self Care Home Management;Canalith Repostioning;Cryotherapy;Electrical Stimulation;Moist Heat;Ultrasound;Neuromuscular re-education;Balance training;Therapeutic exercise;Therapeutic activities;Functional mobility training;Stair training;Gait training;Patient/family education;Manual techniques;Passive range of motion;Vestibular;Visual/perceptual remediation/compensation;Taping;Dry needling    PT Next Visit Plan  monitor vestibular symptoms and ?check orthostatics; assess response to DN, manual/modalities and posture exercises    Consulted and Agree with Plan of Care  Patient       Patient will benefit from skilled therapeutic intervention in order to improve the following deficits and impairments:  Decreased balance, Impaired flexibility, Postural dysfunction, Decreased range of motion, Dizziness, Impaired perceived functional ability, Impaired vision/preception, Increased muscle spasms, Increased fascial restricitons, Pain, Hypomobility  Visit Diagnosis: Cervicalgia  Unsteadiness on feet     Problem List Patient Active Problem List   Diagnosis Date Noted  . Whiplash 02/22/2017  . 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 OSA on CPAP 12/23/2015  . Urinary incontinence 03/31/2014  . Fibrocystic breast disease 06/06/2011  . Routine health maintenance 11/12/2010  . Obesity 09/11/2007  . Hyperlipidemia 06/10/2007      Laureen Abrahams, PT, DPT 03/19/17 11:45 AM    [facility] [place], [place], [address] Phone: [phone]   Fax:  [phone]  Name: Kelly [name] MRN: [id number] Date of Birth: 07/12/1940

## 2017-03-21 ENCOUNTER — Encounter: Payer: Self-pay | Admitting: Physical Therapy

## 2017-03-21 ENCOUNTER — Other Ambulatory Visit: Payer: Self-pay

## 2017-03-21 ENCOUNTER — Ambulatory Visit (INDEPENDENT_AMBULATORY_CARE_PROVIDER_SITE_OTHER): Payer: Medicare HMO | Admitting: Physical Therapy

## 2017-03-21 DIAGNOSIS — R2681 Unsteadiness on feet: Secondary | ICD-10-CM

## 2017-03-21 DIAGNOSIS — M542 Cervicalgia: Secondary | ICD-10-CM | POA: Diagnosis not present

## 2017-03-21 NOTE — Therapy (Signed)
[facility] [address] [place], [place], [address] Phone: [phone]   Fax:  [phone]  Physical Therapy Treatment  Patient Details  Name: Kelly [name] MRN: [id number] Date of Birth: 12/24/1940 Referring Provider: Dr. Charlann Boxer   Encounter Date: 03/21/2017  PT End of Session - 03/21/17 1412    Visit Number  6    Number of Visits  12    Date for PT Re-Evaluation  04/17/17    Authorization Type  Aetna Medicare    PT Start Time  1330    PT Stop Time  1413    PT Time Calculation (min)  43 min    Activity Tolerance  Patient tolerated treatment well    Behavior During Therapy  [facility] for tasks assessed/performed       Past Medical History:  Diagnosis Date  . Allergy   . Blood transfusion without reported diagnosis   . Breast mass    fibocystic breast dx  . Hepatitis    CMV 1992  . Hyperlipidemia   . Postmenopausal HRT (hormone replacement therapy)   . Scarlet fever as a teen    Past Surgical History:  Procedure Laterality Date  . APPENDECTOMY    . BUNIONECTOMY    . caesarean section    . COLONOSCOPY    . CORRECTION HAMMER TOE    . FOOT TENDON SURGERY     left   . POLYPECTOMY    . TONSILLECTOMY      There were no vitals filed for this visit.  Subjective Assessment - 03/21/17 1334    Subjective  doing well; feels like she has a little more motion.  felt woozy and lightheaded after last session (DN to neck).    Patient Stated Goals  improve balance    Currently in Pain?  Yes    Pain Score  0-No pain    Pain Location  Hip    Pain Orientation  Right    Pain Descriptors / Indicators  Tightness;Heaviness    Pain Type  Acute pain    Pain Onset  1 to 4 weeks ago    Pain Frequency  Intermittent    Aggravating Factors   pain with active lumbar/hip extension     Pain Relieving Factors  rest    Pain Score  4    Pain Location  Neck    Pain Orientation  Right;Left    Pain Descriptors / Indicators  Sharp    Pain Type   Acute pain    Pain Onset  More than a month ago    Pain Frequency  Intermittent    Aggravating Factors   rotation             Vestibular Assessment - 03/21/17 1338      Orthostatics   BP supine (x 5 minutes)  108/66    BP standing (after 1 minute)  120/76    BP standing (after 3 minutes)  120/68    Orthostatics Comment  mild symptoms upon standing initially              [facility] Adult PT Treatment/Exercise - 03/21/17 1357      Self-Care   Other Self-Care Comments   educated on orthostatic hypotension as pt with positive symptoms but BP didn't indicate pt is positive; pt to try some different recomendations to help with symptoms      Lumbar Exercises: Supine   Other Supine Lumbar Exercises  decompression series x 10 reps each  PT Education - 03/21/17 1410    Education provided  Yes    Education Details  orthostatic hypotension; decompression series    Person(s) Educated  Patient    Methods  Explanation;Demonstration    Comprehension  Verbalized understanding;Returned demonstration;Need further instruction       PT Short Term Goals - 03/09/17 1102      PT SHORT TERM GOAL #1   Title  perform FGA with appropriate goal to be written    Status  Achieved        PT Long Term Goals - 03/09/17 1102      PT LONG TERM GOAL #1   Title  independent with HEP    Status  On-going    Target Date  04/17/17      PT LONG TERM GOAL #2   Title  improve cervical ROM by at least 10 degrees all motions for improved mobility and IADLs    Status  On-going    Target Date  04/17/17      PT LONG TERM GOAL #3   Title  demonstrate improved vestibular input by standing without LOB on compliant surface with feet together and EC at least 30 seconds    Status  On-going    Target Date  04/17/17      PT LONG TERM GOAL #4   Title  improve FGA to >/= 22/30 for decreased fall risk and improved functional mobility    Status  New    Target Date  04/17/17             Plan - 03/21/17 1524    Clinical Impression Statement  Assessed orthostatics today as pt's symptoms seemed most consistent with this diagnosis, but BP remainined steady with testing.  Pt symptomatic with supine to stand though so reviewed handout and recommendations with pt.  Added decompression exercises for neck and low back today to help begin strengthening and posture exercises.  Pt tolerated session well without increase in pain.  Slowly progressing towards goals.    PT Treatment/Interventions  ADLs/Self Care Home Management;Canalith Repostioning;Cryotherapy;Electrical Stimulation;Moist Heat;Ultrasound;Neuromuscular re-education;Balance training;Therapeutic exercise;Therapeutic activities;Functional mobility training;Stair training;Gait training;Patient/family education;Manual techniques;Passive range of motion;Vestibular;Visual/perceptual remediation/compensation;Taping;Dry needling    PT Next Visit Plan  DN PRN, manual/modalities and posture exercises; give gaze stabilization    Consulted and Agree with Plan of Care  Patient       Patient will benefit from skilled therapeutic intervention in order to improve the following deficits and impairments:  Decreased balance, Impaired flexibility, Postural dysfunction, Decreased range of motion, Dizziness, Impaired perceived functional ability, Impaired vision/preception, Increased muscle spasms, Increased fascial restricitons, Pain, Hypomobility  Visit Diagnosis: Cervicalgia  Unsteadiness on feet     Problem List Patient Active Problem List   Diagnosis Date Noted  . Whiplash 02/22/2017  . 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 OSA on CPAP 12/23/2015  . Urinary incontinence 03/31/2014  . Fibrocystic breast disease 06/06/2011  . Routine health maintenance 11/12/2010  . Obesity 09/11/2007  . Hyperlipidemia 06/10/2007      Laureen Abrahams, PT, DPT 03/21/17 3:28 PM    [facility] [place], [place], [address] Phone: [phone]   Fax:  [phone]  Name: Kelly [name] MRN: [id number] Date of Birth: [birth year]

## 2017-03-22 DIAGNOSIS — G4733 Obstructive sleep apnea (adult) (pediatric): Secondary | ICD-10-CM | POA: Diagnosis not present

## 2017-03-27 ENCOUNTER — Ambulatory Visit (INDEPENDENT_AMBULATORY_CARE_PROVIDER_SITE_OTHER): Payer: Medicare HMO | Admitting: Physical Therapy

## 2017-03-27 ENCOUNTER — Encounter: Payer: Self-pay | Admitting: Physical Therapy

## 2017-03-27 DIAGNOSIS — M542 Cervicalgia: Secondary | ICD-10-CM | POA: Diagnosis not present

## 2017-03-27 DIAGNOSIS — R2681 Unsteadiness on feet: Secondary | ICD-10-CM

## 2017-03-27 NOTE — Patient Instructions (Signed)
Gaze Stabilization: Standing Feet Apart    Feet apart, keeping eyes on target on wall 5-7 feet away, move head side to side for __30-60__ seconds. Repeat while moving head up and down for _30-60___ seconds. Do _2-3___ sessions per day.  Have the target in front of a busy background.  Copyright  VHI. All rights reserved.   Gaze Stabilization: Tip Card  1.Target must remain in focus, not blurry, and appear stationary while head is in motion. 2.Perform exercises with small head movements (45 to either side of midline). 3.Increase speed of head motion so long as target is in focus. 4.If you wear eyeglasses, be sure you can see target through lens (therapist will give specific instructions for bifocal / progressive lenses). 5.These exercises may provoke dizziness or nausea. Work through these symptoms. If too dizzy, slow head movement slightly. Rest between each exercise. 6.Exercises demand concentration; avoid distractions. 7.For safety, perform standing exercises close to a counter, wall, corner, or next to someone.  Copyright  VHI. All rights reserved.

## 2017-03-27 NOTE — Therapy (Signed)
[facility] [address] [place], [place], [address] Phone: [phone]   Fax:  [phone]  Physical Therapy Treatment  Patient Details  Name: Kelly [name] MRN: [id number] Date of Birth: 30-Jun-1940 Referring Provider: Dr. Charlann Boxer   Encounter Date: 03/27/2017  PT End of Session - 03/27/17 1244    Visit Number  7    Number of Visits  12    Date for PT Re-Evaluation  04/17/17    Authorization Type  Aetna Medicare    PT Start Time  1105    PT Stop Time  1147    PT Time Calculation (min)  42 min    Activity Tolerance  Patient tolerated treatment well    Behavior During Therapy  [facility] for tasks assessed/performed       Past Medical History:  Diagnosis Date  . Allergy   . Blood transfusion without reported diagnosis   . Breast mass    fibocystic breast dx  . Hepatitis    CMV 1992  . Hyperlipidemia   . Postmenopausal HRT (hormone replacement therapy)   . Scarlet fever as a teen    Past Surgical History:  Procedure Laterality Date  . APPENDECTOMY    . BUNIONECTOMY    . caesarean section    . COLONOSCOPY    . CORRECTION HAMMER TOE    . FOOT TENDON SURGERY     left   . POLYPECTOMY    . TONSILLECTOMY      There were no vitals filed for this visit.  Subjective Assessment - 03/27/17 1103    Subjective  still having issues with Rt low back. Feel like she has trouble "propelling" RLE when getting out of car.  Improves with movement.  Neck still feels tight and sore on Rt side.  Feels wooziness is better.    Patient Stated Goals  improve balance    Currently in Pain?  Yes    Pain Score  0-No pain no pain at rest; pain up to 6/10    Pain Location  Hip    Pain Orientation  Right;Posterior    Pain Descriptors / Indicators  Shooting    Pain Type  Acute pain    Pain Onset  1 to 4 weeks ago    Pain Frequency  Intermittent    Aggravating Factors   transitional movements, getting out of car    Pain Relieving Factors  rest                       [facility] Adult PT Treatment/Exercise - 03/27/17 0001      Manual Therapy   Manual Therapy  Soft tissue mobilization    Soft tissue mobilization  IASTM and trigger point release to Rt piriformis and glutes      Vestibular Treatment/Exercise - 03/27/17 1111      Vestibular Treatment/Exercise   Vestibular Treatment Provided  Gaze    Gaze Exercises  X1 Viewing Horizontal;X1 Viewing Vertical      X1 Viewing Horizontal   Foot Position  apart    Time  -- 30 sec    Reps  1    Comments  full field stimulus      X1 Viewing Vertical   Foot Position  apart    Time  -- 30 sec    Reps  1    Comments  full field stimulus      Trigger Point Dry Needling - 03/27/17 1144  Consent Given?  Yes    Muscles Treated Lower Body  Gluteus minimus;Piriformis    Gluteus Minimus Response  Twitch response elicited;Palpable increased muscle length    Piriformis Response  Twitch response elicited;Palpable increased muscle length           PT Education - 03/27/17 1243    Education provided  Yes    Education Details  gaze standing    Person(s) Educated  Patient    Methods  Explanation;Demonstration;Handout    Comprehension  Verbalized understanding;Returned demonstration;Need further instruction       PT Short Term Goals - 03/09/17 1102      PT SHORT TERM GOAL #1   Title  perform FGA with appropriate goal to be written    Status  Achieved        PT Long Term Goals - 03/09/17 1102      PT LONG TERM GOAL #1   Title  independent with HEP    Status  On-going    Target Date  04/17/17      PT LONG TERM GOAL #2   Title  improve cervical ROM by at least 10 degrees all motions for improved mobility and IADLs    Status  On-going    Target Date  04/17/17      PT LONG TERM GOAL #3   Title  demonstrate improved vestibular input by standing without LOB on compliant surface with feet together and EC at least 30 seconds    Status  On-going    Target Date   04/17/17      PT LONG TERM GOAL #4   Title  improve FGA to >/= 22/30 for decreased fall risk and improved functional mobility    Status  New    Target Date  04/17/17            Plan - 03/27/17 1244    Clinical Impression Statement  Added gaze stabilization to HEP today with slight increase in symptoms with full field stimulus.  DN performed to Rt piriformis and glute min today with good twitch responses.  Will continue to benefit from PT to maximize function.    PT Treatment/Interventions  ADLs/Self Care Home Management;Canalith Repostioning;Cryotherapy;Electrical Stimulation;Moist Heat;Ultrasound;Neuromuscular re-education;Balance training;Therapeutic exercise;Therapeutic activities;Functional mobility training;Stair training;Gait training;Patient/family education;Manual techniques;Passive range of motion;Vestibular;Visual/perceptual remediation/compensation;Taping;Dry needling    PT Next Visit Plan  DN PRN, manual/modalities and posture exercises; review gaze stabilization    Consulted and Agree with Plan of Care  Patient       Patient will benefit from skilled therapeutic intervention in order to improve the following deficits and impairments:  Decreased balance, Impaired flexibility, Postural dysfunction, Decreased range of motion, Dizziness, Impaired perceived functional ability, Impaired vision/preception, Increased muscle spasms, Increased fascial restricitons, Pain, Hypomobility  Visit Diagnosis: Cervicalgia  Unsteadiness on feet     Problem List Patient Active Problem List   Diagnosis Date Noted  . Whiplash 02/22/2017  . 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 OSA on CPAP 12/23/2015  . Urinary incontinence 03/31/2014  . Fibrocystic breast disease 06/06/2011  . Routine health maintenance 11/12/2010  . Obesity 09/11/2007  . Hyperlipidemia 06/10/2007     Laureen Abrahams, PT, DPT 03/27/17 12:53 PM    [facility] [address] [place], [place], [address] Phone: [phone]   Fax:  [phone]  Name: Odell Fasching MRN: [id number] Date of Birth: 1940/06/16

## 2017-03-29 ENCOUNTER — Ambulatory Visit (INDEPENDENT_AMBULATORY_CARE_PROVIDER_SITE_OTHER): Payer: Medicare HMO | Admitting: Physical Therapy

## 2017-03-29 ENCOUNTER — Encounter: Payer: Self-pay | Admitting: Physical Therapy

## 2017-03-29 DIAGNOSIS — R2681 Unsteadiness on feet: Secondary | ICD-10-CM | POA: Diagnosis not present

## 2017-03-29 DIAGNOSIS — M542 Cervicalgia: Secondary | ICD-10-CM | POA: Diagnosis not present

## 2017-03-29 NOTE — Therapy (Signed)
[facility] [address] [place], [place], [address] Phone: [phone]   Fax:  [phone]  Physical Therapy Treatment  Patient Details  Name: Kelly [name] MRN: [id number] Date of Birth: [birth year] Referring Provider: Dr. Charlann Boxer   Encounter Date: 03/29/2017  PT End of Session - 03/29/17 1201    Visit Number  8    Number of Visits  12    Date for PT Re-Evaluation  04/17/17    Authorization Type  Aetna Medicare    PT Start Time  1102    PT Stop Time  1148    PT Time Calculation (min)  46 min    Activity Tolerance  Patient tolerated treatment well    Behavior During Therapy  [facility] for tasks assessed/performed       Past Medical History:  Diagnosis Date  . Allergy   . Blood transfusion without reported diagnosis   . Breast mass    fibocystic breast dx  . Hepatitis    CMV 1992  . Hyperlipidemia   . Postmenopausal HRT (hormone replacement therapy)   . Scarlet fever as a teen    Past Surgical History:  Procedure Laterality Date  . APPENDECTOMY    . BUNIONECTOMY    . caesarean section    . COLONOSCOPY    . CORRECTION HAMMER TOE    . FOOT TENDON SURGERY     left   . POLYPECTOMY    . TONSILLECTOMY      There were no vitals filed for this visit.  Subjective Assessment - 03/29/17 1103    Subjective  DN last session really helped, leg isn't as sore and feels like she's able to "propel" leg better.  now stating Lt side is hurting.  gaze exercise - reports she's seeing an "extra line" which sounds like mild double vision (reports this is at rest without head motions)    Patient Stated Goals  improve balance    Currently in Pain?  Yes    Pain Score  0-No pain up to 5-6/10 with rotation    Pain Location  Neck    Pain Orientation  Right    Pain Descriptors / Indicators  Shooting    Pain Type  Acute pain    Pain Onset  1 to 4 weeks ago    Pain Frequency  Intermittent    Aggravating Factors   cervical rotation                       [facility] Adult PT Treatment/Exercise - 03/29/17 1111      Self-Care   Other Self-Care Comments   reviewed STM with ball work      Neck Exercises: Theraband   Scapula Retraction  10 reps;Red 5 sec hold    Shoulder Extension  10 reps;Red 5 sec hold    Shoulder External Rotation  10 reps;Red bil      Lumbar Exercises: Supine   Ab Set  10 reps;5 seconds    Clam  10 reps red theraband; single limb; bil    Bridge  10 reps;5 seconds    Other Supine Lumbar Exercises  decompression series x 10 reps each      Manual Therapy   Manual Therapy  Soft tissue mobilization    Soft tissue mobilization  trigger point release and STM to Rt UT and LS               PT Short Term Goals -  03/09/17 1102      PT SHORT TERM GOAL #1   Title  perform FGA with appropriate goal to be written    Status  Achieved        PT Long Term Goals - 03/09/17 1102      PT LONG TERM GOAL #1   Title  independent with HEP    Status  On-going    Target Date  04/17/17      PT LONG TERM GOAL #2   Title  improve cervical ROM by at least 10 degrees all motions for improved mobility and IADLs    Status  On-going    Target Date  04/17/17      PT LONG TERM GOAL #3   Title  demonstrate improved vestibular input by standing without LOB on compliant surface with feet together and EC at least 30 seconds    Status  On-going    Target Date  04/17/17      PT LONG TERM GOAL #4   Title  improve FGA to >/= 22/30 for decreased fall risk and improved functional mobility    Status  New    Target Date  04/17/17            Plan - 03/29/17 1201    Clinical Impression Statement  Pt tolerated increased strengthening exercises well today and progressed to theraband activities without increase in pain.  Pt with active trigger points in Rt levator scapula and may benefit from DN to this area next visit.  Slowly progressing with PT.    PT Treatment/Interventions  ADLs/Self Care Home  Management;Canalith Repostioning;Cryotherapy;Electrical Stimulation;Moist Heat;Ultrasound;Neuromuscular re-education;Balance training;Therapeutic exercise;Therapeutic activities;Functional mobility training;Stair training;Gait training;Patient/family education;Manual techniques;Passive range of motion;Vestibular;Visual/perceptual remediation/compensation;Taping;Dry needling    PT Next Visit Plan  DN to levator scapula (review lung field education), manual/modalities, posture exercises.    Consulted and Agree with Plan of Care  Patient       Patient will benefit from skilled therapeutic intervention in order to improve the following deficits and impairments:  Decreased balance, Impaired flexibility, Postural dysfunction, Decreased range of motion, Dizziness, Impaired perceived functional ability, Impaired vision/preception, Increased muscle spasms, Increased fascial restricitons, Pain, Hypomobility  Visit Diagnosis: Cervicalgia  Unsteadiness on feet     Problem List Patient Active Problem List   Diagnosis Date Noted  . Whiplash 02/22/2017  . 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 OSA on CPAP 12/23/2015  . Urinary incontinence 03/31/2014  . Fibrocystic breast disease 06/06/2011  . Routine health maintenance 11/12/2010  . Obesity 09/11/2007  . Hyperlipidemia 06/10/2007      Laureen Abrahams, PT, DPT 03/29/17 12:10 PM    [facility] [address] [place], [place], [address] Phone: [phone]   Fax:  [phone]  Name: Kelly [name] MRN: [id number] Date of Birth: Apr 30, 1941

## 2017-04-02 ENCOUNTER — Ambulatory Visit (INDEPENDENT_AMBULATORY_CARE_PROVIDER_SITE_OTHER): Payer: Medicare HMO | Admitting: Physical Therapy

## 2017-04-02 DIAGNOSIS — R2681 Unsteadiness on feet: Secondary | ICD-10-CM

## 2017-04-02 DIAGNOSIS — M542 Cervicalgia: Secondary | ICD-10-CM
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
## 2017-04-09 ENCOUNTER — Encounter: Payer: Self-pay | Admitting: Physical Therapy

## 2017-04-09 ENCOUNTER — Ambulatory Visit (INDEPENDENT_AMBULATORY_CARE_PROVIDER_SITE_OTHER): Payer: Medicare HMO | Admitting: Physical Therapy

## 2017-04-09 DIAGNOSIS — R2681 Unsteadiness on feet: Secondary | ICD-10-CM | POA: Diagnosis not present

## 2017-04-09 DIAGNOSIS — M542 Cervicalgia: Secondary | ICD-10-CM
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
## 2017-04-09 NOTE — Patient Instructions (Signed)
Feet Together (Compliant Surface) Varied Arm Positions - Eyes Closed    Stand on compliant surface: __pillow or cushion__ with feet together and arms AT YOUR SIDE. Close eyes and try to stand still. Hold__10-15__ seconds. Repeat __5_ times per session. Do __1-2__ sessions per day.   Feet Together (Compliant Surface) Head Motion - Eyes Closed    Stand on compliant surface: __pillow or cushion______ with feet together. Close eyes and move head slowly, up and down 10 times each way and side to side 10 times each way. Repeat __1-2__ times per session. Do __1-2__ sessions per day.   Feet Partial Heel-Toe (Compliant Surface) Head Motion - Eyes Open    With eyes open, standing on compliant surface: __pillow or cushion______, right foot partially in front of the other, move head slowly: up and down 10 times each way and side to side 10 times each way.  Repeat with left foot in front. Repeat _1-2___ times per session. Do __1-2__ sessions per day.
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
## 2017-04-11 ENCOUNTER — Encounter: Payer: Self-pay | Admitting: Family Medicine

## 2017-04-11 ENCOUNTER — Ambulatory Visit: Payer: Medicare HMO | Admitting: Family Medicine

## 2017-04-11 VITALS — BP 118/80 | HR 74 | Wt 192.0 lb

## 2017-04-11 DIAGNOSIS — R29818 Other symptoms and signs involving the nervous system: Secondary | ICD-10-CM

## 2017-04-11 DIAGNOSIS — G3281 Cerebellar ataxia in diseases classified elsewhere: Secondary | ICD-10-CM | POA: Diagnosis not present

## 2017-04-11 DIAGNOSIS — M5416 Radiculopathy, lumbar region: Secondary | ICD-10-CM | POA: Diagnosis not present

## 2017-04-11 DIAGNOSIS — S134XXA Sprain of ligaments of cervical spine, initial encounter: Secondary | ICD-10-CM | POA: Diagnosis not present

## 2017-04-11 DIAGNOSIS — M5412 Radiculopathy, cervical region: Secondary | ICD-10-CM

## 2017-04-11 DIAGNOSIS — R413 Other amnesia: Secondary | ICD-10-CM

## 2017-04-11 HISTORY — DX: Radiculopathy, lumbar region: M54.16

## 2017-04-11 NOTE — Patient Instructions (Signed)
Good to see you  We will try to get all the scan with you still having the symptoms.  Lets see what it shows and then can go from there on the treatment.  Ice is your friend Still be careful for the exercises Continue the vitamins See me again after all the imaging and we will discuss.

## 2017-04-11 NOTE — Assessment & Plan Note (Signed)
Signs and symptoms for some lumbar radiculopathy. Increasing tightness of the lumbar spine and patient does have significant decrease in hip girdle strength. I do feel at this point advance imaging would be warranted. Has failed all other conservative therapy. We discussed different medications a can help in the interim the patient would like to hold until advance imaging at this time.
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
## 2017-04-12 ENCOUNTER — Encounter: Payer: Self-pay | Admitting: Physical Therapy

## 2017-04-12 ENCOUNTER — Ambulatory Visit (INDEPENDENT_AMBULATORY_CARE_PROVIDER_SITE_OTHER): Payer: Medicare HMO | Admitting: Physical Therapy

## 2017-04-12 DIAGNOSIS — M542 Cervicalgia: Secondary | ICD-10-CM

## 2017-04-12 DIAGNOSIS — R2681 Unsteadiness on feet: Secondary | ICD-10-CM | POA: Diagnosis not present

## 2017-04-12 NOTE — Therapy (Signed)
[facility] [address] [place], [place], [address] Phone: [phone]   Fax:  [phone]  Physical Therapy Treatment/Discharge  Patient Details  Name: Kelly [name] MRN: [id number] Date of Birth: Feb 09, 1941 Referring Provider: Dr. Charlann Boxer   Encounter Date: 04/12/2017  PT End of Session - 04/12/17 1153    Visit Number  11    Number of Visits  12    Date for PT Re-Evaluation  04/17/17    Authorization Type  [employer]    PT Start Time  1105 pt arrived late; d/c visit    PT Stop Time  1140    PT Time Calculation (min)  35 min       Past Medical History:  Diagnosis Date  . Allergy   . Blood transfusion without reported diagnosis   . Breast mass    fibocystic breast dx  . Hepatitis    CMV 1992  . Hyperlipidemia   . Postmenopausal HRT (hormone replacement therapy)   . Scarlet fever as a teen    Past Surgical History:  Procedure Laterality Date  . APPENDECTOMY    . BUNIONECTOMY    . caesarean section    . COLONOSCOPY    . CORRECTION HAMMER TOE    . FOOT TENDON SURGERY     left   . POLYPECTOMY    . TONSILLECTOMY      There were no vitals filed for this visit.  Subjective Assessment - 04/12/17 1108    Subjective  followed up with Dr. Tamala Julian; he's plannign MRI of brain, neck, and back.       Patient Stated Goals  improve balance    Currently in Pain?  Yes    Pain Score  5     Pain Orientation  Right    Pain Descriptors / Indicators  Shooting;Aching    Pain Type  Acute pain    Pain Radiating Towards  occasionally into scapula    Pain Onset  1 to 4 weeks ago    Pain Frequency  Intermittent    Aggravating Factors   cervical rotation    Pain Relieving Factors  rest    Pain Score  4    Pain Location  Back    Pain Orientation  Right;Lower    Pain Descriptors / Indicators  Sharp    Pain Type  Acute pain    Pain Onset  More than a month ago    Pain Frequency  Intermittent    Aggravating Factors   rotation          [facility] PT Assessment - 04/12/17 1114      AROM   AROM Assessment Site  Cervical    Cervical Flexion  36    Cervical Extension  30    Cervical - Right Side Bend  17    Cervical - Left Side Bend  12    Cervical - Right Rotation  11    Cervical - Left Rotation  15      High Level Balance   High Level Balance Comments  feet together with EC on compliant x 35 sec      Functional Gait  Assessment   Gait assessed   Yes    Gait Level Surface  Walks 20 ft, slow speed, abnormal gait pattern, evidence for imbalance or deviates 10-15 in outside of the 12 in walkway width. Requires more than 7 sec to ambulate 20 ft.    Change in Gait Speed  Able to smoothly change walking speed without loss of balance or gait deviation. Deviate no more than 6 in outside of the 12 in walkway width.    Gait with Horizontal Head Turns  Performs head turns smoothly with slight change in gait velocity (eg, minor disruption to smooth gait path), deviates 6-10 in outside 12 in walkway width, or uses an assistive device.    Gait with Vertical Head Turns  Performs task with slight change in gait velocity (eg, minor disruption to smooth gait path), deviates 6 - 10 in outside 12 in walkway width or uses assistive device    Gait and Pivot Turn  Pivot turns safely within 3 sec and stops quickly with no loss of balance.    Step Over Obstacle  Is able to step over one shoe box (4.5 in total height) without changing gait speed. No evidence of imbalance.    Gait with Narrow Base of Support  Ambulates 4-7 steps.    Gait with Eyes Closed  Walks 20 ft, slow speed, abnormal gait pattern, evidence for imbalance, deviates 10-15 in outside 12 in walkway width. Requires more than 9 sec to ambulate 20 ft.    Ambulating Backwards  Walks 20 ft, slow speed, abnormal gait pattern, evidence for imbalance, deviates 10-15 in outside 12 in walkway width.    Steps  Alternating feet, must use rail.    Total Score  18                        Balance Exercises - 04/12/17 1124      Balance Exercises: Standing   Standing Eyes Closed  Foam/compliant surface;Narrow base of support (BOS);5 reps;10 secs;Head turns    Partial Tandem Stance  Eyes open;Foam/compliant surface head turns x 10 each way          PT Short Term Goals - 03/09/17 1102      PT SHORT TERM GOAL #1   Title  perform FGA with appropriate goal to be written    Status  Achieved        PT Long Term Goals - 04/12/17 1153      PT LONG TERM GOAL #1   Title  independent with HEP    Status  Achieved      PT LONG TERM GOAL #2   Title  improve cervical ROM by at least 10 degrees all motions for improved mobility and IADLs    Baseline  11/29: extension and Rt side bending met; all others not met; rotation decreased bil    Status  Partially Met      PT LONG TERM GOAL #3   Title  demonstrate improved vestibular input by standing without LOB on compliant surface with feet together and EC at least 30 seconds    Status  Achieved      PT LONG TERM GOAL #4   Title  improve FGA to >/= 22/30 for decreased fall risk and improved functional mobility    Baseline  11/29: improved from 13/30 to 18/30    Status  Not Met            Plan - 04/12/17 1154    Clinical Impression Statement  Pt has met 2 LTGs and partially met 1 LTGs.  FGA goal improved but not met.  Pt with minimal change in pain or subjective reports of function and at this time recommending d/c due to limited progress.  Pt followed up with MD yesterday and  additional imaging has been ordered at this time.  Pt with decrease in cervical rotation, and no change in flexion or Lt sidebending.  Extension and Rt sidebending improved.  Pt agreeable to d/c at this time.    PT Treatment/Interventions  ADLs/Self Care Home Management;Canalith Repostioning;Cryotherapy;Electrical Stimulation;Moist Heat;Ultrasound;Neuromuscular re-education;Balance training;Therapeutic  exercise;Therapeutic activities;Functional mobility training;Stair training;Gait training;Patient/family education;Manual techniques;Passive range of motion;Vestibular;Visual/perceptual remediation/compensation;Taping;Dry needling    PT Next Visit Plan  d/c PT at this time    Consulted and Agree with Plan of Care  Patient       Patient will benefit from skilled therapeutic intervention in order to improve the following deficits and impairments:  Decreased balance, Impaired flexibility, Postural dysfunction, Decreased range of motion, Dizziness, Impaired perceived functional ability, Impaired vision/preception, Increased muscle spasms, Increased fascial restricitons, Pain, Hypomobility  Visit Diagnosis: Cervicalgia  Unsteadiness on feet   [facility] PT PB G-CODES - Apr 20, 2017 1156    Functional Assessment Tool Used   clinical judgement    Functional Limitations  Mobility: Walking and moving around    Mobility: Walking and Moving Around Goal Status [phone])  At least 1 percent but less than 20 percent impaired, limited or restricted    Mobility: Walking and Moving Around Discharge Status [phone])  At least 20 percent but less than 40 percent impaired, limited or restricted       Problem List Patient Active Problem List   Diagnosis Date Noted  . Neck pain with neck stiffness after whiplash injury to neck 04/11/2017  . Lumbar radiculopathy 04/11/2017  . Whiplash 02/22/2017  . 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 OSA on CPAP 12/23/2015  . Urinary incontinence 03/31/2014  . Fibrocystic breast disease 06/06/2011  . Routine health maintenance 11/12/2010  . Obesity 09/11/2007  . Hyperlipidemia 06/10/2007      Laureen Abrahams, PT, DPT 04/20/2017 11:57 AM    [facility] [place], [place], [address] Phone: [phone]   Fax:  [phone]  Name: Kelly [name] MRN: [id number] Date of Birth: 12/09/1940      PHYSICAL  THERAPY DISCHARGE SUMMARY  Visits from Start of Care: 11  Current functional level related to goals / functional outcomes: See above   Remaining deficits: See above; additional imaging ordered   Education / Equipment: HEP  Plan: Patient agrees to discharge.  Patient goals were partially met. Patient is being discharged due to lack of progress.  ?????      Laureen Abrahams, PT, DPT April 20, 2017 11:58 AM   [facility] [address] [place], [place], [address] Phone: [phone]  Fax: [phone]

## 2017-04-19 ENCOUNTER — Telehealth: Payer: Self-pay | Admitting: Internal Medicine

## 2017-04-19 ENCOUNTER — Other Ambulatory Visit: Payer: Self-pay | Admitting: Internal Medicine

## 2017-04-19 NOTE — Telephone Encounter (Signed)
Copied from [place]. Topic: Quick Communication - See Telephone Encounter >> Apr 19, 2017  3:32 PM Boyd Kerbs wrote: CRM for notification. See Telephone encounter for:  Patient called asking for a prescription for medicine to help while in the MRI on Monday 10/10 (will be in machine for 2-40 minutes )  [id number] - [place], [place] - [place]. AT [address]   04/19/17.

## 2017-04-23 ENCOUNTER — Other Ambulatory Visit: Payer: Medicare HMO

## 2017-04-23 NOTE — Telephone Encounter (Signed)
Pt called requesting medicine to relax her for a series of MRIs that she will be having on Dec 19 th and 20 th. She is having a cervical and brain MRI on the 19 th, at 8:40  Am. So two on  that day and will be in the machine up to 40 mins for each one. On the 20 th she will have MRI of the lumbar spine at 3:30 pm. [id number] , [place].

## 2017-04-26 ENCOUNTER — Encounter: Payer: Medicare HMO | Admitting: Internal Medicine

## 2017-04-26 NOTE — Telephone Encounter (Signed)
Doctor ordering MRI should order medication for her for this.

## 2017-04-26 NOTE — Telephone Encounter (Signed)
She needs valium sent in for her MRI.

## 2017-04-26 NOTE — Telephone Encounter (Signed)
Patient states this message was supposed to go to Dr. Tamala Julian

## 2017-04-27 MED ORDER — DIAZEPAM 5 MG PO TABS: ORAL_TABLET | ORAL | 0 refills | Status: DC

## 2017-04-27 NOTE — Telephone Encounter (Signed)
done

## 2017-05-02 ENCOUNTER — Ambulatory Visit
Admission: RE | Admit: 2017-05-02 | Discharge: 2017-05-02 | Disposition: A | Discharge status: Home / Self Care | Payer: Medicare HMO | Source: Ambulatory Visit | Attending: Family Medicine | Admitting: Family Medicine

## 2017-05-02 DIAGNOSIS — M5412 Radiculopathy, cervical region: Secondary | ICD-10-CM

## 2017-05-02 DIAGNOSIS — R413 Other amnesia: Secondary | ICD-10-CM

## 2017-05-02 DIAGNOSIS — R29818 Other symptoms and signs involving the nervous system: Secondary | ICD-10-CM

## 2017-05-02 DIAGNOSIS — G3281 Cerebellar ataxia in diseases classified elsewhere: Secondary | ICD-10-CM

## 2017-05-02 DIAGNOSIS — M4802 Spinal stenosis, cervical region: Secondary | ICD-10-CM | POA: Diagnosis not present

## 2017-05-03 ENCOUNTER — Ambulatory Visit
Admission: RE | Admit: 2017-05-03 | Discharge: 2017-05-03 | Disposition: A | Discharge status: Home / Self Care | Payer: Medicare HMO | Source: Ambulatory Visit | Attending: Family Medicine | Admitting: Family Medicine

## 2017-05-03 ENCOUNTER — Ambulatory Visit (INDEPENDENT_AMBULATORY_CARE_PROVIDER_SITE_OTHER): Payer: Medicare HMO | Admitting: Internal Medicine

## 2017-05-03 ENCOUNTER — Encounter: Payer: Self-pay | Admitting: Internal Medicine

## 2017-05-03 VITALS — BP 100/70 | HR 70 | Temp 97.7°F | Ht 67.0 in | Wt 194.0 lb

## 2017-05-03 DIAGNOSIS — E785 Hyperlipidemia, unspecified: Secondary | ICD-10-CM

## 2017-05-03 DIAGNOSIS — Z0000 Encounter for general adult medical examination without abnormal findings: Secondary | ICD-10-CM | POA: Diagnosis not present

## 2017-05-03 DIAGNOSIS — M48061 Spinal stenosis, lumbar region without neurogenic claudication: Secondary | ICD-10-CM | POA: Diagnosis not present

## 2017-05-03 DIAGNOSIS — G4733 Obstructive sleep apnea (adult) (pediatric): Secondary | ICD-10-CM | POA: Diagnosis not present

## 2017-05-03 DIAGNOSIS — E669 Obesity, unspecified: Secondary | ICD-10-CM | POA: Diagnosis not present

## 2017-05-03 DIAGNOSIS — Z6830 Body mass index (BMI) 30.0-30.9, adult: Secondary | ICD-10-CM

## 2017-05-03 DIAGNOSIS — Z9989 Dependence on other enabling machines and devices: Secondary | ICD-10-CM

## 2017-05-03 DIAGNOSIS — M5416 Radiculopathy, lumbar region: Secondary | ICD-10-CM

## 2017-05-03 NOTE — Assessment & Plan Note (Signed)
Working on weight loss and down 6 pounds since last year.

## 2017-05-03 NOTE — Assessment & Plan Note (Signed)
Continues to use CPAP. °

## 2017-05-03 NOTE — Progress Notes (Signed)
   Subjective:    Patient ID: Kelly [name], female    DOB: 01/28/1941, 76 y.o.   MRN: [id number]  HPI Here for medicare wellness and physical, no new complaints. Please see A/P for status and treatment of chronic medical problems.   Diet: heart healthy  Physical activity: sedentary Depression/mood screen: negative Hearing: moderate loss bilaterally, trying hearing aids Visual acuity: grossly normal, performs annual eye exam  ADLs: capable Fall risk: none Home safety: good Cognitive evaluation: intact to orientation, naming, recall and repetition EOL planning: adv directives discussed  I have personally reviewed and have noted 1. The patient's medical and social history - reviewed today no changes 2. Their use of alcohol, tobacco or illicit drugs 3. Their current medications and supplements 4. The patient's functional ability including ADL's, fall risks, home safety risks and hearing or visual impairment. 5. Diet and physical activities 6. Evidence for depression or mood disorders 7. Care team reviewed and updated (available in snapshot)  Review of Systems  Constitutional: Negative.   HENT: Negative.   Eyes: Negative.   Respiratory: Negative for cough, chest tightness and shortness of breath.   Cardiovascular: Negative for chest pain, palpitations and leg swelling.  Gastrointestinal: Negative for abdominal distention, abdominal pain, constipation, diarrhea, nausea and vomiting.  Musculoskeletal: Positive for arthralgias and myalgias.  Skin: Negative.   Neurological: Negative.   Psychiatric/Behavioral: Negative.       Objective:   Physical Exam  Constitutional: She is oriented to person, place, and time. She appears well-developed and well-nourished.  HENT:  Head: Normocephalic and atraumatic.  Eyes: EOM are normal.  Neck: Normal range of motion.  Cardiovascular: Normal rate and regular rhythm.  Pulmonary/Chest: Effort normal and breath sounds normal. No respiratory  distress. She has no wheezes. She has no rales.  Abdominal: Soft. Bowel sounds are normal. She exhibits no distension. There is no tenderness. There is no rebound.  Musculoskeletal: She exhibits tenderness. She exhibits no edema.  Neurological: She is alert and oriented to person, place, and time. Coordination normal.  Skin: Skin is warm and dry.  Psychiatric: She has a normal mood and affect.   Vitals:   05/03/17 1326  BP: 100/70  Pulse: 70  Temp: 97.7 F (36.5 C)  TempSrc: Oral  SpO2: 98%  Weight: 194 lb (88 kg)  Height: 5\' 7"  (1.702 m)      Assessment & Plan:

## 2017-05-03 NOTE — Patient Instructions (Signed)
Ask the pharmacist about the shingles vaccine (shingrix).   Health Maintenance, Female Adopting a healthy lifestyle and getting preventive care can go a long way to promote health and wellness. Talk with your health care provider about what schedule of regular examinations is right for you. This is a good chance for you to check in with your provider about disease prevention and staying healthy. In between checkups, there are plenty of things you can do on your own. Experts have done a lot of research about which lifestyle changes and preventive measures are most likely to keep you healthy. Ask your health care provider for more information. Weight and diet Eat a healthy diet  Be sure to include plenty of vegetables, fruits, low-fat dairy products, and lean protein.  Do not eat a lot of foods high in solid fats, added sugars, or salt.  Get regular exercise. This is one of the most important things you can do for your health. ? Most adults should exercise for at least 150 minutes each week. The exercise should increase your heart rate and make you sweat (moderate-intensity exercise). ? Most adults should also do strengthening exercises at least twice a week. This is in addition to the moderate-intensity exercise.  Maintain a healthy weight  Body mass index (BMI) is a measurement that can be used to identify possible weight problems. It estimates body fat based on height and weight. Your health care provider can help determine your BMI and help you achieve or maintain a healthy weight.  For females 28 years of age and older: ? A BMI below 18.5 is considered underweight. ? A BMI of 18.5 to 24.9 is normal. ? A BMI of 25 to 29.9 is considered overweight. ? A BMI of 30 and above is considered obese.  Watch levels of cholesterol and blood lipids  You should start having your blood tested for lipids and cholesterol at 73 years of age, then have this test every 5 years.  You may need to have  your cholesterol levels checked more often if: ? Your lipid or cholesterol levels are high. ? You are older than 74 years of age. ? You are at high risk for heart disease.  Cancer screening Lung Cancer  Lung cancer screening is recommended for adults 36-22 years old who are at high risk for lung cancer because of a history of smoking.  A yearly low-dose CT scan of the lungs is recommended for people who: ? Currently smoke. ? Have quit within the past 15 years. ? Have at least a 30-pack-year history of smoking. A pack year is smoking an average of one pack of cigarettes a day for 1 year.  Yearly screening should continue until it has been 15 years since you quit.  Yearly screening should stop if you develop a health problem that would prevent you from having lung cancer treatment.  Breast Cancer  Practice breast self-awareness. This means understanding how your breasts normally appear and feel.  It also means doing regular breast self-exams. Let your health care provider know about any changes, no matter how small.  If you are in your 20s or 30s, you should have a clinical breast exam (CBE) by a health care provider every 1-3 years as part of a regular health exam.  If you are 32 or older, have a CBE every year. Also consider having a breast X-ray (mammogram) every year.  If you have a family history of breast cancer, talk to your health care provider  about genetic screening.  If you are at high risk for breast cancer, talk to your health care provider about having an MRI and a mammogram every year.  Breast cancer gene (BRCA) assessment is recommended for women who have family members with BRCA-related cancers. BRCA-related cancers include: ? Breast. ? Ovarian. ? Tubal. ? Peritoneal cancers.  Results of the assessment will determine the need for genetic counseling and BRCA1 and BRCA2 testing.  Cervical Cancer Your health care provider may recommend that you be screened  regularly for cancer of the pelvic organs (ovaries, uterus, and vagina). This screening involves a pelvic examination, including checking for microscopic changes to the surface of your cervix (Pap test). You may be encouraged to have this screening done every 3 years, beginning at age 58.  For women ages 62-65, health care providers may recommend pelvic exams and Pap testing every 3 years, or they may recommend the Pap and pelvic exam, combined with testing for human papilloma virus (HPV), every 5 years. Some types of HPV increase your risk of cervical cancer. Testing for HPV may also be done on women of any age with unclear Pap test results.  Other health care providers may not recommend any screening for nonpregnant women who are considered low risk for pelvic cancer and who do not have symptoms. Ask your health care provider if a screening pelvic exam is right for you.  If you have had past treatment for cervical cancer or a condition that could lead to cancer, you need Pap tests and screening for cancer for at least 20 years after your treatment. If Pap tests have been discontinued, your risk factors (such as having a new sexual partner) need to be reassessed to determine if screening should resume. Some women have medical problems that increase the chance of getting cervical cancer. In these cases, your health care provider may recommend more frequent screening and Pap tests.  Colorectal Cancer  This type of cancer can be detected and often prevented.  Routine colorectal cancer screening usually begins at 74 years of age and continues through 74 years of age.  Your health care provider may recommend screening at an earlier age if you have risk factors for colon cancer.  Your health care provider may also recommend using home test kits to check for hidden blood in the stool.  A small camera at the end of a tube can be used to examine your colon directly (sigmoidoscopy or colonoscopy). This is  done to check for the earliest forms of colorectal cancer.  Routine screening usually begins at age 6.  Direct examination of the colon should be repeated every 5-10 years through 74 years of age. However, you may need to be screened more often if early forms of precancerous polyps or small growths are found.  Skin Cancer  Check your skin from head to toe regularly.  Tell your health care provider about any new moles or changes in moles, especially if there is a change in a mole's shape or color.  Also tell your health care provider if you have a mole that is larger than the size of a pencil eraser.  Always use sunscreen. Apply sunscreen liberally and repeatedly throughout the day.  Protect yourself by wearing long sleeves, pants, a wide-brimmed hat, and sunglasses whenever you are outside.  Heart disease, diabetes, and high blood pressure  High blood pressure causes heart disease and increases the risk of stroke. High blood pressure is more likely to develop in: ?  People who have blood pressure in the high end of the normal range (130-139/85-89 mm Hg). ? People who are overweight or obese. ? People who are African American.  If you are 18-39 years of age, have your blood pressure checked every 3-5 years. If you are 40 years of age or older, have your blood pressure checked every year. You should have your blood pressure measured twice-once when you are at a hospital or clinic, and once when you are not at a hospital or clinic. Record the average of the two measurements. To check your blood pressure when you are not at a hospital or clinic, you can use: ? An automated blood pressure machine at a pharmacy. ? A home blood pressure monitor.  If you are between 55 years and 79 years old, ask your health care provider if you should take aspirin to prevent strokes.  Have regular diabetes screenings. This involves taking a blood sample to check your fasting blood sugar level. ? If you are  at a normal weight and have a low risk for diabetes, have this test once every three years after 74 years of age. ? If you are overweight and have a high risk for diabetes, consider being tested at a younger age or more often. Preventing infection Hepatitis B  If you have a higher risk for hepatitis B, you should be screened for this virus. You are considered at high risk for hepatitis B if: ? You were born in a country where hepatitis B is common. Ask your health care provider which countries are considered high risk. ? Your parents were born in a high-risk country, and you have not been immunized against hepatitis B (hepatitis B vaccine). ? You have HIV or AIDS. ? You use needles to inject street drugs. ? You live with someone who has hepatitis B. ? You have had sex with someone who has hepatitis B. ? You get hemodialysis treatment. ? You take certain medicines for conditions, including cancer, organ transplantation, and autoimmune conditions.  Hepatitis C  Blood testing is recommended for: ? Everyone born from 1945 through 1965. ? Anyone with known risk factors for hepatitis C.  Sexually transmitted infections (STIs)  You should be screened for sexually transmitted infections (STIs) including gonorrhea and chlamydia if: ? You are sexually active and are younger than 74 years of age. ? You are older than 74 years of age and your health care provider tells you that you are at risk for this type of infection. ? Your sexual activity has changed since you were last screened and you are at an increased risk for chlamydia or gonorrhea. Ask your health care provider if you are at risk.  If you do not have HIV, but are at risk, it may be recommended that you take a prescription medicine daily to prevent HIV infection. This is called pre-exposure prophylaxis (PrEP). You are considered at risk if: ? You are sexually active and do not regularly use condoms or know the HIV status of your  partner(s). ? You take drugs by injection. ? You are sexually active with a partner who has HIV.  Talk with your health care provider about whether you are at high risk of being infected with HIV. If you choose to begin PrEP, you should first be tested for HIV. You should then be tested every 3 months for as long as you are taking PrEP. Pregnancy  If you are premenopausal and you may become pregnant, ask your health   care provider about preconception counseling.  If you may become pregnant, take 400 to 800 micrograms (mcg) of folic acid every day.  If you want to prevent pregnancy, talk to your health care provider about birth control (contraception). Osteoporosis and menopause  Osteoporosis is a disease in which the bones lose minerals and strength with aging. This can result in serious bone fractures. Your risk for osteoporosis can be identified using a bone density scan.  If you are 65 years of age or older, or if you are at risk for osteoporosis and fractures, ask your health care provider if you should be screened.  Ask your health care provider whether you should take a calcium or vitamin D supplement to lower your risk for osteoporosis.  Menopause may have certain physical symptoms and risks.  Hormone replacement therapy may reduce some of these symptoms and risks. Talk to your health care provider about whether hormone replacement therapy is right for you. Follow these instructions at home:  Schedule regular health, dental, and eye exams.  Stay current with your immunizations.  Do not use any tobacco products including cigarettes, chewing tobacco, or electronic cigarettes.  If you are pregnant, do not drink alcohol.  If you are breastfeeding, limit how much and how often you drink alcohol.  Limit alcohol intake to no more than 1 drink per day for nonpregnant women. One drink equals 12 ounces of beer, 5 ounces of wine, or 1 ounces of hard liquor.  Do not use street  drugs.  Do not share needles.  Ask your health care provider for help if you need support or information about quitting drugs.  Tell your health care provider if you often feel depressed.  Tell your health care provider if you have ever been abused or do not feel safe at home. This information is not intended to replace advice given to you by your health care provider. Make sure you discuss any questions you have with your health care provider. Document Released: 11/14/2010 Document Revised: 10/07/2015 Document Reviewed: 02/02/2015 Elsevier Interactive Patient Education  2018 Elsevier Inc.  

## 2017-05-03 NOTE — Assessment & Plan Note (Signed)
Taking lipitor 40 mg daily and checking lipid panel and adjust as needed. She would like to decrease if lipids stable since she is working on weight loss.

## 2017-05-03 NOTE — Assessment & Plan Note (Signed)
Colonoscopy and mammogram up to date. Flu and tetanus and pneumonia up to date. Counseled about shingrix vaccine and she will inquire about cost. Counseled about sun safety and mole surveillance. Given 10 year screening recommendations.

## 2017-05-04 ENCOUNTER — Other Ambulatory Visit (INDEPENDENT_AMBULATORY_CARE_PROVIDER_SITE_OTHER): Payer: Medicare HMO

## 2017-05-04 DIAGNOSIS — E785 Hyperlipidemia, unspecified: Secondary | ICD-10-CM

## 2017-05-04 LAB — LIPID PANEL
Cholesterol: 161 mg/dL (ref 0–200)
HDL: 47 mg/dL
LDL Cholesterol: 74 mg/dL (ref 0–99)
NonHDL: 113.76
Total CHOL/HDL Ratio: 3
Triglycerides: 198 mg/dL — ABNORMAL HIGH (ref 0.0–149.0)
VLDL: 39.6 mg/dL (ref 0.0–40.0)

## 2017-05-04 LAB — COMPREHENSIVE METABOLIC PANEL WITH GFR
ALT: 12 U/L (ref 0–35)
AST: 14 U/L (ref 0–37)
Albumin: 4.1 g/dL (ref 3.5–5.2)
Alkaline Phosphatase: 48 U/L (ref 39–117)
BUN: 21 mg/dL (ref 6–23)
CO2: 30 meq/L (ref 19–32)
Calcium: 9.1 mg/dL (ref 8.4–10.5)
Chloride: 104 meq/L (ref 96–112)
Creatinine, Ser: 0.82 mg/dL (ref 0.40–1.20)
GFR: 72.3 mL/min
Glucose, Bld: 93 mg/dL (ref 70–99)
Potassium: 3.8 meq/L (ref 3.5–5.1)
Sodium: 141 meq/L (ref 135–145)
Total Bilirubin: 0.6 mg/dL (ref 0.2–1.2)
Total Protein: 7.1 g/dL (ref 6.0–8.3)

## 2017-05-04 LAB — CBC
HCT: 40.9 % (ref 36.0–46.0)
Hemoglobin: 13.5 g/dL (ref 12.0–15.0)
MCHC: 33.1 g/dL (ref 30.0–36.0)
MCV: 90 fl (ref 78.0–100.0)
Platelets: 365 10*3/uL (ref 150.0–400.0)
RBC: 4.55 Mil/uL (ref 3.87–5.11)
RDW: 13.3 % (ref 11.5–15.5)
WBC: 5.5 10*3/uL (ref 4.0–10.5)

## 2017-05-04 LAB — VITAMIN D 25 HYDROXY (VIT D DEFICIENCY, FRACTURES): VITD: 24.88 ng/mL — AB (ref 30.00–100.00)

## 2017-05-04 LAB — VITAMIN B12: Vitamin B-12: 399 pg/mL (ref 211–911)

## 2017-05-07 ENCOUNTER — Encounter: Payer: Self-pay | Admitting: Family Medicine

## 2017-05-07 DIAGNOSIS — S134XXA Sprain of ligaments of cervical spine, initial encounter: Secondary | ICD-10-CM

## 2017-05-16 ENCOUNTER — Other Ambulatory Visit: Payer: Self-pay | Admitting: Internal Medicine

## 2017-05-25 ENCOUNTER — Ambulatory Visit
Admission: RE | Admit: 2017-05-25 | Discharge: 2017-05-25 | Disposition: A | Discharge status: Home / Self Care | Payer: Medicare HMO | Source: Ambulatory Visit | Attending: Family Medicine | Admitting: Family Medicine

## 2017-05-25 DIAGNOSIS — S134XXA Sprain of ligaments of cervical spine, initial encounter: Secondary | ICD-10-CM

## 2017-05-25 DIAGNOSIS — M47812 Spondylosis without myelopathy or radiculopathy, cervical region: Secondary | ICD-10-CM | POA: Diagnosis not present

## 2017-05-25 MED ORDER — IOPAMIDOL (ISOVUE-M 300) INJECTION 61%
1.0000 mL | Freq: Once | INTRAMUSCULAR | Status: AC | PRN
  Administered 2017-05-25: 1 mL via EPIDURAL

## 2017-05-25 MED ORDER — TRIAMCINOLONE ACETONIDE 40 MG/ML IJ SUSP (RADIOLOGY)
60.0000 mg | Freq: Once | INTRAMUSCULAR | Status: AC
  Administered 2017-05-25: 60 mg via EPIDURAL

## 2017-05-25 NOTE — Discharge Instructions (Signed)

## 2017-05-31 ENCOUNTER — Encounter: Payer: Self-pay | Admitting: Family Medicine

## 2017-06-19 DIAGNOSIS — R69 Illness, unspecified: Secondary | ICD-10-CM | POA: Diagnosis not present

## 2017-06-19 DIAGNOSIS — H2513 Age-related nuclear cataract, bilateral: Secondary | ICD-10-CM | POA: Diagnosis not present

## 2017-06-19 DIAGNOSIS — H04123 Dry eye syndrome of bilateral lacrimal glands: Secondary | ICD-10-CM | POA: Diagnosis not present

## 2017-06-19 DIAGNOSIS — H40013 Open angle with borderline findings, low risk, bilateral: Secondary | ICD-10-CM | POA: Diagnosis not present

## 2017-06-19 DIAGNOSIS — H25013 Cortical age-related cataract, bilateral: Secondary | ICD-10-CM | POA: Diagnosis not present

## 2017-06-19 LAB — HM DIABETES EYE EXAM

## 2017-06-22 ENCOUNTER — Encounter: Payer: Self-pay | Admitting: Internal Medicine

## 2017-07-31 ENCOUNTER — Encounter: Payer: Self-pay | Admitting: Family Medicine

## 2017-08-07 DIAGNOSIS — Z0100 Encounter for examination of eyes and vision without abnormal findings: Secondary | ICD-10-CM | POA: Diagnosis not present

## 2017-08-17 ENCOUNTER — Encounter: Payer: Self-pay | Admitting: Family Medicine

## 2017-08-26 ENCOUNTER — Encounter: Payer: Self-pay | Admitting: Family Medicine

## 2017-09-06 ENCOUNTER — Telehealth: Payer: Self-pay | Admitting: Internal Medicine

## 2017-09-06 NOTE — Telephone Encounter (Signed)
Copied from [place] [id number]. Topic: Quick Communication - See Telephone Encounter >> Sep 06, 2017  9:16 AM Ahmed Prima L wrote: CRM for notification. See Telephone encounter for: 09/06/17.  [facility] called in regards to atorvastatin (LIPITOR) 40 MG tablet. They need a verbal for approval to refill it. Please call back @ [phone] reference number [id number]

## 2017-09-07 NOTE — Telephone Encounter (Signed)
Verbals given  

## 2017-09-21 DIAGNOSIS — G4733 Obstructive sleep apnea (adult) (pediatric): Secondary | ICD-10-CM | POA: Diagnosis not present

## 2017-11-27 ENCOUNTER — Other Ambulatory Visit: Payer: Self-pay | Admitting: Internal Medicine

## 2017-11-27 MED ORDER — ATORVASTATIN CALCIUM 40 MG PO TABS: ORAL_TABLET | ORAL | 0 refills | Status: DC

## 2017-11-27 NOTE — Telephone Encounter (Signed)
Copied from [place] [id number]. Topic: Quick Communication - Rx Refill/Question >> Nov 27, 2017  1:20 PM Burchel, Abbi R wrote: Medication: atorvastatin (LIPITOR) 40 MG tablet    Preferred Pharmacy:  [id number] - [place], [place] - [address]  [phone] (Phone) [phone] (Fax)   Pt has moved this rx to her mail order pharmacy, but Pt will be out of meds on Friday (11/30/17).The pharmacy advised her to request 5-7days of meds be sent to her local pharmacy so that she does not miss any doses before her rx will arrive. Pt was advised that RX refills may take up to 3 business days.   Pt: [phone]

## 2017-11-27 NOTE — Telephone Encounter (Signed)
7 day refill sent to local pharmacy as requested by the pt.

## 2017-11-29 DIAGNOSIS — H903 Sensorineural hearing loss, bilateral: Secondary | ICD-10-CM | POA: Diagnosis not present

## 2018-02-02 DIAGNOSIS — Z1231 Encounter for screening mammogram for malignant neoplasm of breast: Secondary | ICD-10-CM | POA: Diagnosis not present

## 2018-02-02 LAB — HM MAMMOGRAPHY

## 2018-02-05 ENCOUNTER — Encounter: Payer: Self-pay | Admitting: Internal Medicine

## 2018-02-05 NOTE — Progress Notes (Signed)
Abstracted and sent to scan  

## 2018-03-04 DIAGNOSIS — Z23 Encounter for immunization: Secondary | ICD-10-CM | POA: Diagnosis not present

## 2018-03-07 DIAGNOSIS — Z780 Asymptomatic menopausal state: Secondary | ICD-10-CM | POA: Diagnosis not present

## 2018-04-23 ENCOUNTER — Encounter: Payer: Self-pay | Admitting: Family Medicine

## 2018-04-23 ENCOUNTER — Ambulatory Visit: Payer: Medicare Other | Admitting: Family Medicine

## 2018-04-23 ENCOUNTER — Ambulatory Visit (INDEPENDENT_AMBULATORY_CARE_PROVIDER_SITE_OTHER): Payer: Medicare Other

## 2018-04-23 VITALS — BP 118/92 | HR 84 | Temp 98.3°F | Ht 67.0 in | Wt 203.0 lb

## 2018-04-23 DIAGNOSIS — G8929 Other chronic pain: Secondary | ICD-10-CM

## 2018-04-23 DIAGNOSIS — S4992XA Unspecified injury of left shoulder and upper arm, initial encounter: Secondary | ICD-10-CM | POA: Diagnosis not present

## 2018-04-23 DIAGNOSIS — M25512 Pain in left shoulder: Secondary | ICD-10-CM | POA: Diagnosis not present

## 2018-04-23 DIAGNOSIS — G4733 Obstructive sleep apnea (adult) (pediatric): Secondary | ICD-10-CM | POA: Diagnosis not present
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
## 2018-04-23 NOTE — Patient Instructions (Signed)
Nice to meet you  Please try the xray  Please try ice on the shoulder  Please see me back in 4-6 weeks if no better.

## 2018-04-24 ENCOUNTER — Ambulatory Visit: Payer: Self-pay | Admitting: *Deleted

## 2018-04-24 DIAGNOSIS — M25512 Pain in left shoulder: Principal | ICD-10-CM

## 2018-04-24 DIAGNOSIS — G8929 Other chronic pain: Secondary | ICD-10-CM | POA: Insufficient documentation
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
## 2018-04-24 NOTE — Telephone Encounter (Signed)
Spoke with patient about her pain.   Rosemarie Ax, MD [facility] 04/24/2018, 2:08 PM

## 2018-04-24 NOTE — Telephone Encounter (Signed)
Returned call to patient regarding having more pain since receiving the injection in her left shoulder yesterday. She is having more pain since the injection. Have not tried ice. Has aleve at home so will take it and use ice when she returns home. Reminded to review her patient visit summary. Pain is in her bicelf and back side of shoulder is sore. Also the top of her shoulder is sore. Routing to flow at [facility] at [facility] for review.

## 2018-04-30 ENCOUNTER — Encounter: Payer: Self-pay | Admitting: Internal Medicine

## 2018-04-30 ENCOUNTER — Ambulatory Visit (INDEPENDENT_AMBULATORY_CARE_PROVIDER_SITE_OTHER): Payer: Medicare Other | Admitting: Internal Medicine

## 2018-04-30 DIAGNOSIS — M25512 Pain in left shoulder: Secondary | ICD-10-CM | POA: Diagnosis not present

## 2018-04-30 DIAGNOSIS — S134XXA Sprain of ligaments of cervical spine, initial encounter: Secondary | ICD-10-CM | POA: Diagnosis not present

## 2018-04-30 DIAGNOSIS — G8929 Other chronic pain: Secondary | ICD-10-CM | POA: Diagnosis not present

## 2018-04-30 NOTE — Assessment & Plan Note (Signed)
Still having problems with the neck and is doing exercise. Fairly normal although reduced ROM on exam with mild tenderness. MRI reviewed and no changes needed based on MRI. Will continue with exercise and advised glucosamine and turmeric.

## 2018-04-30 NOTE — Patient Instructions (Signed)
I would ice the shoulder to see if this helps.   Try glucosamine or turmeric to see if this helps.   You can come back for a physical in the new year if you want.
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
## 2018-04-30 NOTE — Assessment & Plan Note (Signed)
Injection did not relieve pain and we talked about glucosamine and turmeric to help as well as icing the shoulder and continued exercise.

## 2018-05-06 NOTE — Progress Notes (Signed)
Subjective:   Kelly [name] is a 77 y.o. female who presents for Medicare Annual (Subsequent) preventive examination.  Review of Systems:  No ROS.  Medicare Wellness Visit. Additional risk factors are reflected in the social history.  Cardiac Risk Factors include: advanced age (>105men, >72 women);hypertension;dyslipidemia Sleep patterns: feels rested on waking, gets up 1 times nightly to void and sleeps 7 hours nightly.    Home Safety/Smoke Alarms: Feels safe in home. Smoke alarms in place.  Living environment; residence and Firearm Safety: apartment, no firearms. Lives alone, no needs for DME, limited support system Seat Belt Safety/Bike Helmet: Wears seat belt.     Objective:     Vitals: BP 126/72   Pulse 84   Temp 98.6 F (37 C)   Resp 18   Ht 5\' 7"  (1.702 m)   Wt 202 lb (91.6 kg)   SpO2 97%   BMI 31.64 kg/m   Body mass index is 31.64 kg/m.  Advanced Directives 05/13/2018 03/06/2017  Does Patient Have a Medical Advance Directive? Yes Yes  Type of Paramedic of [place];Living will Living will;Healthcare Power of [place] in Chart? No - copy requested -    Tobacco Social History   Tobacco Use  Smoking Status Never Smoker  Smokeless Tobacco Never Used     Counseling given: Not Answered  Past Medical History:  Diagnosis Date  . Allergy   . Blood transfusion without reported diagnosis   . Breast mass    fibocystic breast dx  . Hepatitis    CMV 1992  . Hyperlipidemia   . Postmenopausal HRT (hormone replacement therapy)   . Scarlet fever as a teen   Past Surgical History:  Procedure Laterality Date  . APPENDECTOMY    . BUNIONECTOMY    . caesarean section    . COLONOSCOPY    . CORRECTION HAMMER TOE    . FOOT TENDON SURGERY     left   . POLYPECTOMY    . TONSILLECTOMY     Family History  Problem Relation Age of Onset  . Pancreatic cancer Mother   . Hyperlipidemia Mother   .  Hypertension Mother   . Polymyalgia rheumatica Mother   . Dementia Father    Social History   Socioeconomic History  . Marital status: Single    Spouse name: Not on file  . Number of children: 2  . Years of education: 23  . Highest education level: Not on file  Occupational History  . Occupation: MORTGAGE Surveyor, mining: [employer]    Comment: webinars on Hotel manager: [place]  . Financial resource strain: Not hard at all  . Food insecurity:    Worry: Never true    Inability: Never true  . Transportation needs:    Medical: No    Non-medical: No  Tobacco Use  . Smoking status: Never Smoker  . Smokeless tobacco: Never Used  Substance and Sexual Activity  . Alcohol use: Yes    Comment: rarely will have a glass of wine  . Drug use: No  . Sexual activity: Yes    Partners: Male  Lifestyle  . Physical activity:    Days per week: 3 days    Minutes per session: 50 min  . Stress: Not at all  Relationships  . Social connections:    Talks on phone: More than three times a week  Gets together: More than three times a week    Attends religious service: More than 4 times per year    Active member of club or organization: Yes    Attends meetings of clubs or organizations: More than 4 times per year    Relationship status: Not on file  Other Topics Concern  . Not on file  Social History Narrative   [name] [place]; Master's [employer]. Lives alone and independently. Married '67--24 yrs. - widowed '92.  2 dtrs - '69, '73; 6 grandchildren. ACP/EoL - yes CPR, yes for short term ventilation, no prolonged heroic care in an irreversible state.     Outpatient Encounter Medications as of 05/13/2018  Medication Sig  . atorvastatin (LIPITOR) 40 MG tablet TAKE 1 TABLET (40 MG TOTAL) BY MOUTH DAILY.  Marland Kitchen estradiol (CLIMARA - DOSED IN MG/24 HR) 0.05 mg/24hr patch 1/2 patch placed on the skin weekly  . estradiol  (ESTRACE) 0.1 MG/GM vaginal cream Place 6.27 Applicatorfuls vaginally every Wednesday and Saturday.  . multivitamin (THERAGRAN) per tablet Take 1 tablet by mouth daily.    Marland Kitchen PREMARIN vaginal cream Place 1 Applicatorful vaginally 2 (two) times a week.   . progesterone (PROMETRIUM) 200 MG capsule Take 1 capsule (200 mg total) by mouth as directed. 12 days out of every 6 months. (Patient taking differently: Take 200 mg by mouth as directed. 2 months 14 days then skip a month)   No facility-administered encounter medications on file as of 05/13/2018.     Activities of Daily Living In your present state of health, do you have any difficulty performing the following activities: 05/13/2018  Hearing? Y  Vision? N  Difficulty concentrating or making decisions? N  Walking or climbing stairs? N  Dressing or bathing? N  Doing errands, shopping? N  Preparing Food and eating ? N  Using the Toilet? N  In the past six months, have you accidently leaked urine? N  Do you have problems with loss of bowel control? N  Managing your Medications? N  Managing your Finances? N  Housekeeping or managing your Housekeeping? N  Some recent data might be hidden    Patient Care Team: Hoyt Koch, MD as PCP - General (Internal Medicine) Monna Fam, MD (Ophthalmology) Petrinitz, Dellis Filbert, DPM (Inactive) (Podiatry)    Assessment:   This is a routine wellness examination for [name]. Physical assessment deferred to PCP.   Exercise Activities and Dietary recommendations Current Exercise Habits: Structured exercise class, Type of exercise: walking;strength training/weights;calisthenics, Time (Minutes): 55, Frequency (Times/Week): 3, Weekly Exercise (Minutes/Week): 165, Intensity: Mild, Exercise limited by: orthopedic condition(s)  Diet (meal preparation, eat out, water intake, caffeinated beverages, dairy products, fruits and vegetables): in general, a "healthy" diet  , well balanced. eats a variety  of fruits and vegetables daily, limits salt, fat/cholesterol, sugar,carbohydrates,caffeine, drinks 6-8 glasses of water daily.  Goals    . Patient Stated     I want to continue to get massages on a regular basis. Look into getting my foot operated on.       Fall Risk Fall Risk  05/13/2018 05/03/2017 04/18/2016 04/05/2015  Falls in the past year? 1 No No No  Number falls in past yr: 0 - - -  Injury with Fall? 1 - - -  Risk for fall due to : Impaired balance/gait - - -  Follow up Falls prevention discussed - - -    Depression Screen PHQ 2/9 Scores 05/13/2018 05/03/2017 04/18/2016 04/05/2015  PHQ - 2  Score 0 0 0 0     Cognitive Function MMSE - Mini Mental State Exam 05/13/2018  Orientation to time 5  Orientation to Place 5  Registration 3  Attention/ Calculation 5  Recall 3  Language- name 2 objects 2  Language- repeat 1  Language- follow 3 step command 3  Language- read & follow direction 1  Write a sentence 1  Copy design 1  Total score 30        Immunization History  Administered Date(s) Administered  . Influenza Whole 02/20/2008, 04/02/2009  . Influenza, High Dose Seasonal PF 03/18/2013, 03/19/2014, 03/16/2015, 03/07/2016, 02/22/2017  . Influenza-Unspecified 03/08/2018  . Pneumococcal Conjugate-13 04/05/2015  . Pneumococcal Polysaccharide-23 05/27/2010  . Td 11/29/2011  . Zoster 05/27/2010   Screening Tests Health Maintenance  Topic Date Due  . TETANUS/TDAP  11/28/2021  . COLONOSCOPY  07/31/2022  . INFLUENZA VACCINE  Completed  . DEXA SCAN  Completed  . PNA vac Low Risk Adult  Completed      Plan:     Continue doing brain stimulating activities (puzzles, reading, adult coloring books, staying active) to keep memory sharp.   Continue to eat heart healthy diet (full of fruits, vegetables, whole grains, lean protein, water--limit salt, fat, and sugar intake) and increase physical activity as tolerated.  I have personally reviewed and noted the following  in the patient's chart:   . Medical and social history . Use of alcohol, tobacco or illicit drugs  . Current medications and supplements . Functional ability and status . Nutritional status . Physical activity . Advanced directives . List of other physicians . Vitals . Screenings to include cognitive, depression, and falls . Referrals and appointments  In addition, I have reviewed and discussed with patient certain preventive protocols, quality metrics, and best practice recommendations. A written personalized care plan for preventive services as well as general preventive health recommendations were provided to patient.     Michiel Cowboy, RN  05/13/2018

## 2018-05-11 ENCOUNTER — Encounter: Payer: Self-pay | Admitting: Family Medicine

## 2018-05-13 ENCOUNTER — Ambulatory Visit (INDEPENDENT_AMBULATORY_CARE_PROVIDER_SITE_OTHER): Payer: Medicare Other | Admitting: *Deleted

## 2018-05-13 ENCOUNTER — Ambulatory Visit (INDEPENDENT_AMBULATORY_CARE_PROVIDER_SITE_OTHER): Payer: Medicare Other | Admitting: Internal Medicine

## 2018-05-13 ENCOUNTER — Encounter: Payer: Self-pay | Admitting: Internal Medicine

## 2018-05-13 VITALS — BP 126/72 | HR 84 | Temp 98.6°F | Ht 67.0 in | Wt 202.0 lb

## 2018-05-13 VITALS — BP 126/72 | HR 84 | Temp 98.6°F | Resp 18 | Ht 67.0 in | Wt 202.0 lb

## 2018-05-13 DIAGNOSIS — Z9989 Dependence on other enabling machines and devices: Secondary | ICD-10-CM | POA: Diagnosis not present

## 2018-05-13 DIAGNOSIS — Z780 Asymptomatic menopausal state: Secondary | ICD-10-CM

## 2018-05-13 DIAGNOSIS — G8929 Other chronic pain: Secondary | ICD-10-CM

## 2018-05-13 DIAGNOSIS — Z0000 Encounter for general adult medical examination without abnormal findings: Secondary | ICD-10-CM

## 2018-05-13 DIAGNOSIS — M25512 Pain in left shoulder: Secondary | ICD-10-CM | POA: Diagnosis not present

## 2018-05-13 DIAGNOSIS — G4733 Obstructive sleep apnea (adult) (pediatric): Secondary | ICD-10-CM | POA: Diagnosis not present

## 2018-05-13 DIAGNOSIS — E785 Hyperlipidemia, unspecified: Secondary | ICD-10-CM | POA: Diagnosis not present

## 2018-05-13 NOTE — Patient Instructions (Signed)
Continue doing brain stimulating activities (puzzles, reading, adult coloring books, staying active) to keep memory sharp.   Continue to eat heart healthy diet (full of fruits, vegetables, whole grains, lean protein, water--limit salt, fat, and sugar intake) and increase physical activity as tolerated.   Ms. Kelly [name] , Thank you for taking time to come for your Medicare Wellness Visit. I appreciate your ongoing commitment to your health goals. Please review the following plan we discussed and let me know if I can assist you in the future.   These are the goals we discussed: Goals    . Patient Stated     I want to continue to get massages on a regular basis. Look into getting my foot operated on.       This is a list of the screening recommended for you and due dates:  Health Maintenance  Topic Date Due  . Tetanus Vaccine  11/28/2021  . Colon Cancer Screening  07/31/2022  . Flu Shot  Completed  . DEXA scan (bone density measurement)  Completed  . Pneumonia vaccines  Completed   Health Maintenance, Female Adopting a healthy lifestyle and getting preventive care can go a long way to promote health and wellness. Talk with your health care provider about what schedule of regular examinations is right for you. This is a good chance for you to check in with your provider about disease prevention and staying healthy. In between checkups, there are plenty of things you can do on your own. Experts have done a lot of research about which lifestyle changes and preventive measures are most likely to keep you healthy. Ask your health care provider for more information. Weight and diet Eat a healthy diet  Be sure to include plenty of vegetables, fruits, low-fat dairy products, and lean protein.  Do not eat a lot of foods high in solid fats, added sugars, or salt.  Get regular exercise. This is one of the most important things you can do for your health. ? Most adults should exercise for at least  150 minutes each week. The exercise should increase your heart rate and make you sweat (moderate-intensity exercise). ? Most adults should also do strengthening exercises at least twice a week. This is in addition to the moderate-intensity exercise. Maintain a healthy weight  Body mass index (BMI) is a measurement that can be used to identify possible weight problems. It estimates body fat based on height and weight. Your health care provider can help determine your BMI and help you achieve or maintain a healthy weight.  For females 58 years of age and older: ? A BMI below 18.5 is considered underweight. ? A BMI of 18.5 to 24.9 is normal. ? A BMI of 25 to 29.9 is considered overweight. ? A BMI of 30 and above is considered obese. Watch levels of cholesterol and blood lipids  You should start having your blood tested for lipids and cholesterol at 75 years of age, then have this test every 5 years.  You may need to have your cholesterol levels checked more often if: ? Your lipid or cholesterol levels are high. ? You are older than 75 years of age. ? You are at high risk for heart disease. Cancer screening Lung Cancer  Lung cancer screening is recommended for adults 30-68 years old who are at high risk for lung cancer because of a history of smoking.  A yearly low-dose CT scan of the lungs is recommended for people who: ? Currently smoke. ?  Have quit within the past 15 years. ? Have at least a 30-pack-year history of smoking. A pack year is smoking an average of one pack of cigarettes a day for 1 year.  Yearly screening should continue until it has been 15 years since you quit.  Yearly screening should stop if you develop a health problem that would prevent you from having lung cancer treatment. Breast Cancer  Practice breast self-awareness. This means understanding how your breasts normally appear and feel.  It also means doing regular breast self-exams. Let your health care  provider know about any changes, no matter how small.  If you are in your 20s or 30s, you should have a clinical breast exam (CBE) by a health care provider every 1-3 years as part of a regular health exam.  If you are 14 or older, have a CBE every year. Also consider having a breast X-ray (mammogram) every year.  If you have a family history of breast cancer, talk to your health care provider about genetic screening.  If you are at high risk for breast cancer, talk to your health care provider about having an MRI and a mammogram every year.  Breast cancer gene (BRCA) assessment is recommended for women who have family members with BRCA-related cancers. BRCA-related cancers include: ? Breast. ? Ovarian. ? Tubal. ? Peritoneal cancers.  Results of the assessment will determine the need for genetic counseling and BRCA1 and BRCA2 testing. Cervical Cancer Your health care provider may recommend that you be screened regularly for cancer of the pelvic organs (ovaries, uterus, and vagina). This screening involves a pelvic examination, including checking for microscopic changes to the surface of your cervix (Pap test). You may be encouraged to have this screening done every 3 years, beginning at age 9.  For women ages 58-65, health care providers may recommend pelvic exams and Pap testing every 3 years, or they may recommend the Pap and pelvic exam, combined with testing for human papilloma virus (HPV), every 5 years. Some types of HPV increase your risk of cervical cancer. Testing for HPV may also be done on women of any age with unclear Pap test results.  Other health care providers may not recommend any screening for nonpregnant women who are considered low risk for pelvic cancer and who do not have symptoms. Ask your health care provider if a screening pelvic exam is right for you.  If you have had past treatment for cervical cancer or a condition that could lead to cancer, you need Pap tests and  screening for cancer for at least 20 years after your treatment. If Pap tests have been discontinued, your risk factors (such as having a new sexual partner) need to be reassessed to determine if screening should resume. Some women have medical problems that increase the chance of getting cervical cancer. In these cases, your health care provider may recommend more frequent screening and Pap tests. Colorectal Cancer  This type of cancer can be detected and often prevented.  Routine colorectal cancer screening usually begins at 75 years of age and continues through 75 years of age.  Your health care provider may recommend screening at an earlier age if you have risk factors for colon cancer.  Your health care provider may also recommend using home test kits to check for hidden blood in the stool.  A small camera at the end of a tube can be used to examine your colon directly (sigmoidoscopy or colonoscopy). This is done to check for  the earliest forms of colorectal cancer.  Routine screening usually begins at age 53.  Direct examination of the colon should be repeated every 5-10 years through 75 years of age. However, you may need to be screened more often if early forms of precancerous polyps or small growths are found. Skin Cancer  Check your skin from head to toe regularly.  Tell your health care provider about any new moles or changes in moles, especially if there is a change in a mole's shape or color.  Also tell your health care provider if you have a mole that is larger than the size of a pencil eraser.  Always use sunscreen. Apply sunscreen liberally and repeatedly throughout the day.  Protect yourself by wearing long sleeves, pants, a wide-brimmed hat, and sunglasses whenever you are outside. Heart disease, diabetes, and high blood pressure  High blood pressure causes heart disease and increases the risk of stroke. High blood pressure is more likely to develop in: ? People who  have blood pressure in the high end of the normal range (130-139/85-89 mm Hg). ? People who are overweight or obese. ? People who are African American.  If you are 29-38 years of age, have your blood pressure checked every 3-5 years. If you are 60 years of age or older, have your blood pressure checked every year. You should have your blood pressure measured twice-once when you are at a hospital or clinic, and once when you are not at a hospital or clinic. Record the average of the two measurements. To check your blood pressure when you are not at a hospital or clinic, you can use: ? An automated blood pressure machine at a pharmacy. ? A home blood pressure monitor.  If you are between 84 years and 74 years old, ask your health care provider if you should take aspirin to prevent strokes.  Have regular diabetes screenings. This involves taking a blood sample to check your fasting blood sugar level. ? If you are at a normal weight and have a low risk for diabetes, have this test once every three years after 75 years of age. ? If you are overweight and have a high risk for diabetes, consider being tested at a younger age or more often. Preventing infection Hepatitis B  If you have a higher risk for hepatitis B, you should be screened for this virus. You are considered at high risk for hepatitis B if: ? You were born in a country where hepatitis B is common. Ask your health care provider which countries are considered high risk. ? Your parents were born in a high-risk country, and you have not been immunized against hepatitis B (hepatitis B vaccine). ? You have HIV or AIDS. ? You use needles to inject street drugs. ? You live with someone who has hepatitis B. ? You have had sex with someone who has hepatitis B. ? You get hemodialysis treatment. ? You take certain medicines for conditions, including cancer, organ transplantation, and autoimmune conditions. Hepatitis C  Blood testing is  recommended for: ? Everyone born from [birth year] through 1963. ? Anyone with known risk factors for hepatitis C. Sexually transmitted infections (STIs)  You should be screened for sexually transmitted infections (STIs) including gonorrhea and chlamydia if: ? You are sexually active and are younger than 75 years of age. ? You are older than 75 years of age and your health care provider tells you that you are at risk for this type of infection. ?  Your sexual activity has changed since you were last screened and you are at an increased risk for chlamydia or gonorrhea. Ask your health care provider if you are at risk.  If you do not have HIV, but are at risk, it may be recommended that you take a prescription medicine daily to prevent HIV infection. This is called pre-exposure prophylaxis (PrEP). You are considered at risk if: ? You are sexually active and do not regularly use condoms or know the HIV status of your partner(s). ? You take drugs by injection. ? You are sexually active with a partner who has HIV. Talk with your health care provider about whether you are at high risk of being infected with HIV. If you choose to begin PrEP, you should first be tested for HIV. You should then be tested every 3 months for as long as you are taking PrEP. Pregnancy  If you are premenopausal and you may become pregnant, ask your health care provider about preconception counseling.  If you may become pregnant, take 400 to 800 micrograms (mcg) of folic acid every day.  If you want to prevent pregnancy, talk to your health care provider about birth control (contraception). Osteoporosis and menopause  Osteoporosis is a disease in which the bones lose minerals and strength with aging. This can result in serious bone fractures. Your risk for osteoporosis can be identified using a bone density scan.  If you are 70 years of age or older, or if you are at risk for osteoporosis and fractures, ask your health care  provider if you should be screened.  Ask your health care provider whether you should take a calcium or vitamin D supplement to lower your risk for osteoporosis.  Menopause may have certain physical symptoms and risks.  Hormone replacement therapy may reduce some of these symptoms and risks. Talk to your health care provider about whether hormone replacement therapy is right for you. Follow these instructions at home:  Schedule regular health, dental, and eye exams.  Stay current with your immunizations.  Do not use any tobacco products including cigarettes, chewing tobacco, or electronic cigarettes.  If you are pregnant, do not drink alcohol.  If you are breastfeeding, limit how much and how often you drink alcohol.  Limit alcohol intake to no more than 1 drink per day for nonpregnant women. One drink equals 12 ounces of beer, 5 ounces of Olegario Emberson, or 1 ounces of hard liquor.  Do not use street drugs.  Do not share needles.  Ask your health care provider for help if you need support or information about quitting drugs.  Tell your health care provider if you often feel depressed.  Tell your health care provider if you have ever been abused or do not feel safe at home. This information is not intended to replace advice given to you by your health care provider. Make sure you discuss any questions you have with your health care provider. Document Released: 11/14/2010 Document Revised: 10/07/2015 Document Reviewed: 02/02/2015 Elsevier Interactive Patient Education  2019 [employer].

## 2018-05-13 NOTE — Progress Notes (Signed)
   Subjective:   Patient ID: Kelly [name], female    DOB: [birth year], 77 y.o.   MRN: [id number]  HPI The patient is a 77 YO female coming in for physical.   PMH, [place], social history reviewed and updated  Review of Systems  Constitutional: Negative.   HENT: Negative.   Eyes: Negative.   Respiratory: Negative for cough, chest tightness and shortness of breath.   Cardiovascular: Negative for chest pain, palpitations and leg swelling.  Gastrointestinal: Negative for abdominal distention, abdominal pain, constipation, diarrhea, nausea and vomiting.  Musculoskeletal: Positive for arthralgias.  Skin: Negative.   Neurological: Negative.   Psychiatric/Behavioral: Negative.     Objective:  Physical Exam Constitutional:      Appearance: She is well-developed.  HENT:     Head: Normocephalic and atraumatic.  Neck:     Musculoskeletal: Normal range of motion.  Cardiovascular:     Rate and Rhythm: Normal rate and regular rhythm.  Pulmonary:     Effort: Pulmonary effort is normal. No respiratory distress.     Breath sounds: Normal breath sounds. No wheezing or rales.  Abdominal:     General: Bowel sounds are normal. There is no distension.     Palpations: Abdomen is soft.     Tenderness: There is no abdominal tenderness. There is no rebound.  Skin:    General: Skin is warm and dry.  Neurological:     Mental Status: She is alert and oriented to person, place, and time.     Coordination: Coordination normal.     Vitals:   05/13/18 1542  BP: 126/72  Pulse: 84  Temp: 98.6 F (37 C)  SpO2: 97%  Weight: 202 lb (91.6 kg)  Height: 5\' 7"  (1.702 m)    Assessment & Plan:

## 2018-05-13 NOTE — Progress Notes (Signed)
Medical screening examination/treatment/procedure(s) were performed by non-physician practitioner and as supervising physician I was immediately available for consultation/collaboration. I agree with above. Elizabeth A Crawford, MD 

## 2018-05-14 NOTE — Assessment & Plan Note (Signed)
Still using and no problems.

## 2018-05-14 NOTE — Assessment & Plan Note (Signed)
Refer back to Dr. Tamala Julian for evaluation as she is still having significant pain after recent injection and pain is worse than previous to injection.

## 2018-05-14 NOTE — Assessment & Plan Note (Signed)
Flu shot up to date. Pneumonia complete. Shingrix counseled. Tetanus up to date. Colonoscopy up to date. Mammogram up to date, pap smear aged out and dexa ordered. Counseled about sun safety and mole surveillance. Counseled about the dangers of distracted driving. Given 10 year screening recommendations.

## 2018-05-14 NOTE — Assessment & Plan Note (Signed)
Checking lipid panel and adjust lipitor 40 mg daily as needed. 

## 2018-05-20 NOTE — Progress Notes (Signed)
[name] [place], [place] [address] Phone: [phone] Subjective:   [name], am serving as a scribe for Dr. Hulan Saas.    CC:  Left shoulder pain   [id number]  Kelly [name] is a 78 y.o. female coming in with complaint of left shoulder pain. Patient was having bicep pain and pain surrounding shoulder prior to injection. Pain with flexion, IR and horizontal adduction over the bicep and tricep. Has been icing. Denies any radiating symptoms.       Past Medical History:  Diagnosis Date  . Allergy   . Blood transfusion without reported diagnosis   . Breast mass    fibocystic breast dx  . Hepatitis    CMV 1992  . Hyperlipidemia   . Postmenopausal HRT (hormone replacement therapy)   . Scarlet fever as a teen   Past Surgical History:  Procedure Laterality Date  . APPENDECTOMY    . BUNIONECTOMY    . caesarean section    . COLONOSCOPY    . CORRECTION HAMMER TOE    . FOOT TENDON SURGERY     left   . POLYPECTOMY    . TONSILLECTOMY     Social History   Socioeconomic History  . Marital status: Single    Spouse name: Not on file  . Number of children: 2  . Years of education: 27  . Highest education level: Not on file  Occupational History  . Occupation: MORTGAGE Surveyor, mining: [employer]    Comment: webinars on Hotel manager: [place]  . Financial resource strain: Not hard at all  . Food insecurity:    Worry: Never true    Inability: Never true  . Transportation needs:    Medical: No    Non-medical: No  Tobacco Use  . Smoking status: Never Smoker  . Smokeless tobacco: Never Used  Substance and Sexual Activity  . Alcohol use: Yes    Comment: rarely will have a glass of wine  . Drug use: No  . Sexual activity: Yes    Partners: Male  Lifestyle  . Physical activity:    Days per week: 3 days    Minutes per session: 50 min  .  Stress: Not at all  Relationships  . Social connections:    Talks on phone: More than three times a week    Gets together: More than three times a week    Attends religious service: More than 4 times per year    Active member of club or organization: Yes    Attends meetings of clubs or organizations: More than 4 times per year    Relationship status: Not on file  Other Topics Concern  . 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 Lives alone and independently. Married '67--24 yrs. - widowed '92.  2 dtrs - '69, '73; 6 grandchildren. ACP/EoL - yes CPR, yes for short term ventilation, no prolonged heroic care in an irreversible state.    Allergies  Allergen Reactions  . Amoxicillin Rash  . Codeine Nausea Only   Family History  Problem Relation Age of Onset  . Pancreatic cancer Mother   . Hyperlipidemia Mother   . Hypertension Mother   . Polymyalgia rheumatica Mother   . Dementia Father     Current Outpatient Medications (Endocrine & Metabolic):  .  estradiol (CLIMARA - DOSED  IN MG/24 HR) 0.05 mg/24hr patch, 1/2 patch placed on the skin weekly .  progesterone (PROMETRIUM) 200 MG capsule, Take 1 capsule (200 mg total) by mouth as directed. 12 days out of every 6 months. (Patient taking differently: Take 200 mg by mouth as directed. 2 months 14 days then skip a month) .  predniSONE (DELTASONE) 50 MG tablet, Take 1 tablet (50 mg total) by mouth daily.  Current Outpatient Medications (Cardiovascular):  .  atorvastatin (LIPITOR) 40 MG tablet, TAKE 1 TABLET (40 MG TOTAL) BY MOUTH DAILY.     Current Outpatient Medications (Other):  .  estradiol (ESTRACE) 0.1 MG/GM vaginal cream, Place 7.12 Applicatorfuls vaginally every Wednesday and Saturday. .  multivitamin (THERAGRAN) per tablet, Take 1 tablet by mouth daily.   Marland Kitchen  PREMARIN vaginal cream, Place 1 Applicatorful vaginally 2 (two) times a week.     Past medical history, social, surgical and  family history all reviewed in electronic medical record.  No pertanent information unless stated regarding to the chief complaint.   Review of Systems:  No headache, visual changes, nausea, vomiting, diarrhea, constipation, dizziness, abdominal pain, skin rash, fevers, chills, night sweats, weight loss, swollen lymph nodes, body aches, joint swelling,chest pain, shortness of breath, mood changes.  Positive muscle aches  Objective  Blood pressure 120/88, pulse 78, height 5\' 7"  (1.702 m), weight 203 lb (92.1 kg), SpO2 97 %.  General: No apparent distress alert and oriented x3 mood and affect normal, dressed appropriately.  HEENT: Pupils equal, extraocular movements intact  Respiratory: Patient's speak in full sentences and does not appear short of breath  Cardiovascular: No lower extremity edema, non tender, no erythema  Skin: Warm dry intact with no signs of infection or rash on extremities or on axial skeleton.  Abdomen: Soft nontender  Neuro: Cranial nerves II through XII are intact, neurovascularly intact in all extremities with 2+ DTRs and 2+ pulses.  Lymph: No lymphadenopathy of posterior or anterior cervical chain or axillae bilaterally.  Gait mild antalgic MSK:  Non tender with full range of motion and good stability and symmetric strength and tone of , elbows, wrist, hip, knee and ankles bilaterally.  Mild arthritic changes of multiple joints  Left shoulder exam shows the patient does have some decreasing range of motion especially with external rotation.  4 out of 5 strength of the rotator cuff.  Patient does have positive O'Brien's.  No radiation down the arm.   Limited musculoskeletal ultrasound was performed and interpreted by Lyndal Pulley  Limited musculoskeletal ultrasound shows that patient does have a partial high-grade tear of the supraspinatus tendon.    Significant hypoechoic changes within the bicep tendon. Impression: high grade partial tear   Procedure: Real-time  Ultrasound Guided Injection of left glenohumeral joint Device: GE Logiq E  Ultrasound guided injection is preferred based studies that show increased duration, increased effect, greater accuracy, decreased procedural pain, increased response rate with ultrasound guided versus blind injection.  Verbal informed consent obtained.  Time-out conducted.  Noted no overlying erythema, induration, or other signs of local infection.  Skin prepped in a sterile fashion.  Local anesthesia: Topical Ethyl chloride.  With sterile technique and under real time ultrasound guidance:  Joint visualized.  21g 2 inch needle inserted posterior approach. Pictures taken for needle placement. Patient did have injection of 2 cc of 0.5% Marcaine, and 1cc of Kenalog 40 mg/dL. Completed without difficulty  Pain immediately resolved suggesting accurate placement of the medication.  Advised to call if fevers/chills,  erythema, induration, drainage, or persistent bleeding.  Images permanently stored and available for review in the ultrasound unit.  Impression: Technically successful ultrasound guided injection.     Impression and Recommendations:     This case required medical decision making of moderate complexity. The above documentation has been reviewed and is accurate and complete Lyndal Pulley, DO       Note: This dictation was prepared with Dragon dictation along with smaller phrase technology. Any transcriptional errors that result from this process are unintentional.

## 2018-05-21 ENCOUNTER — Ambulatory Visit: Payer: Medicare Other | Admitting: Family Medicine

## 2018-05-21 ENCOUNTER — Encounter: Payer: Self-pay | Admitting: Family Medicine

## 2018-05-21 ENCOUNTER — Ambulatory Visit: Payer: Self-pay

## 2018-05-21 VITALS — BP 120/88 | HR 78 | Ht 67.0 in | Wt 203.0 lb

## 2018-05-21 DIAGNOSIS — M25512 Pain in left shoulder: Secondary | ICD-10-CM

## 2018-05-21 DIAGNOSIS — G8929 Other chronic pain: Secondary | ICD-10-CM

## 2018-05-21 MED ORDER — PREDNISONE 50 MG PO TABS: 50.0000 mg | ORAL_TABLET | Freq: Every day | ORAL | 0 refills | Status: DC

## 2018-05-21 NOTE — Patient Instructions (Signed)
Great to see you  I am sorry you are hurting.  Injected the shoulder joint today  Ice 20 minutes 2 times daily. Usually after activity and before bed. Keep hands within peripheral vision  Exercises 3 times a week.  No lifting more than 10 pounds until I see you again in 4 weeks

## 2018-05-23 ENCOUNTER — Other Ambulatory Visit: Payer: Self-pay | Admitting: Internal Medicine

## 2018-05-24 NOTE — Telephone Encounter (Signed)
Reviewed chart pt is up-to-date sent refills to [facility].Marland KitchenJohny [name]

## 2018-05-24 NOTE — Telephone Encounter (Signed)
Patient is flying out of town on next week and wants to know if meds can be expedited to having to be shipped. Please advise

## 2018-05-29 ENCOUNTER — Other Ambulatory Visit (INDEPENDENT_AMBULATORY_CARE_PROVIDER_SITE_OTHER): Payer: Medicare Other

## 2018-05-29 DIAGNOSIS — E785 Hyperlipidemia, unspecified: Secondary | ICD-10-CM | POA: Diagnosis not present

## 2018-05-29 LAB — LIPID PANEL
Cholesterol: 180 mg/dL (ref 0–200)
HDL: 46.2 mg/dL (ref >39.00)
LDL Cholesterol: 99 mg/dL (ref 0–99)
NonHDL: 134.09
Total CHOL/HDL Ratio: 4
Triglycerides: 173 mg/dL — ABNORMAL HIGH (ref 0.0–149.0)
VLDL: 34.6 mg/dL (ref 0.0–40.0)

## 2018-05-29 LAB — COMPREHENSIVE METABOLIC PANEL
ALT: 12 U/L (ref 0–35)
AST: 13 U/L (ref 0–37)
Albumin: 4.1 g/dL (ref 3.5–5.2)
Alkaline Phosphatase: 48 U/L (ref 39–117)
BUN: 19 mg/dL (ref 6–23)
CALCIUM: 9.8 mg/dL (ref 8.4–10.5)
CO2: 28 mEq/L (ref 19–32)
Chloride: 104 mEq/L (ref 96–112)
Creatinine, Ser: 0.91 mg/dL (ref 0.40–1.20)
GFR: 63.93 mL/min (ref >60.00)
Glucose, Bld: 94 mg/dL (ref 70–99)
Potassium: 4.3 mEq/L (ref 3.5–5.1)
Sodium: 139 mEq/L (ref 135–145)
Total Bilirubin: 0.5 mg/dL (ref 0.2–1.2)
Total Protein: 7.2 g/dL (ref 6.0–8.3)

## 2018-05-29 LAB — CBC
HCT: 40.4 % (ref 36.0–46.0)
Hemoglobin: 13.7 g/dL (ref 12.0–15.0)
MCHC: 34 g/dL (ref 30.0–36.0)
MCV: 87.8 fl (ref 78.0–100.0)
Platelets: 395 10*3/uL (ref 150.0–400.0)
RBC: 4.6 Mil/uL (ref 3.87–5.11)
RDW: 13 % (ref 11.5–15.5)
WBC: 8 10*3/uL (ref 4.0–10.5)
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
## 2018-06-18 ENCOUNTER — Ambulatory Visit: Payer: Medicare Other | Admitting: Family Medicine

## 2018-06-18 ENCOUNTER — Encounter: Payer: Self-pay | Admitting: Family Medicine

## 2018-06-18 VITALS — BP 110/74 | HR 92 | Ht 67.0 in | Wt 203.0 lb

## 2018-06-18 DIAGNOSIS — S134XXA Sprain of ligaments of cervical spine, initial encounter: Secondary | ICD-10-CM | POA: Diagnosis not present

## 2018-06-18 DIAGNOSIS — G8929 Other chronic pain: Secondary | ICD-10-CM | POA: Diagnosis not present

## 2018-06-18 DIAGNOSIS — M75102 Unspecified rotator cuff tear or rupture of left shoulder, not specified as traumatic: Secondary | ICD-10-CM | POA: Diagnosis not present

## 2018-06-18 DIAGNOSIS — M25512 Pain in left shoulder: Secondary | ICD-10-CM

## 2018-06-18 DIAGNOSIS — S134XXD Sprain of ligaments of cervical spine, subsequent encounter: Secondary | ICD-10-CM

## 2018-06-18 NOTE — Assessment & Plan Note (Signed)
Patient does have a rotator cuff tear that seems to be partial.  Should do very well with conservative therapy.  Sent to physical therapy and I think will be great.  Follow-up again in 6 weeks

## 2018-06-18 NOTE — Assessment & Plan Note (Signed)
Continues to have some stiffness.  Sent to physical therapy that I think will be beneficial.  Patient is nearly close to maximal medical improvement but not there yet.  Does have a history of cervical spinal stenosis that also could be contributing.  Will monitor closely.  Follow-up again in 6 weeks

## 2018-06-18 NOTE — Patient Instructions (Addendum)
Good to see you  Ice is your friend Keep hands within peripheral vision  PT will be calling you and I hope we make great stridesa  See me again in 6-7 weeks and we will hopefully be able to release you

## 2018-06-25 DIAGNOSIS — H40013 Open angle with borderline findings, low risk, bilateral: Secondary | ICD-10-CM | POA: Diagnosis not present

## 2018-06-25 DIAGNOSIS — H2513 Age-related nuclear cataract, bilateral: Secondary | ICD-10-CM | POA: Diagnosis not present

## 2018-06-25 DIAGNOSIS — H04123 Dry eye syndrome of bilateral lacrimal glands: Secondary | ICD-10-CM | POA: Diagnosis not present

## 2018-06-25 DIAGNOSIS — H25013 Cortical age-related cataract, bilateral: Secondary | ICD-10-CM | POA: Diagnosis not present

## 2018-06-26 ENCOUNTER — Ambulatory Visit: Payer: Medicare Other | Attending: Family Medicine | Admitting: Physical Therapy

## 2018-06-26 ENCOUNTER — Encounter: Payer: Self-pay | Admitting: Physical Therapy

## 2018-06-26 ENCOUNTER — Other Ambulatory Visit: Payer: Self-pay

## 2018-06-26 DIAGNOSIS — M25612 Stiffness of left shoulder, not elsewhere classified: Secondary | ICD-10-CM

## 2018-06-26 DIAGNOSIS — M25512 Pain in left shoulder: Secondary | ICD-10-CM | POA: Diagnosis not present

## 2018-06-26 DIAGNOSIS — R252 Cramp and spasm: Secondary | ICD-10-CM | POA: Diagnosis not present

## 2018-06-26 NOTE — Patient Instructions (Signed)
Access Code: [email or web address]  URL: [email or web address]  Date: 06/26/2018  Prepared by: Lum Babe   Exercises  Doorway Pec Stretch at 90 Degrees Abduction - 5 reps - 1 sets - 15 hold - 2x daily - 7x weekly  Median Nerve Tensioner - 5 reps - 1 sets - 15 hold - 2x daily - 7x weekly  Median Nerve Tensioner - 5 reps - 1 sets - 10 hold - 2x daily - 7x weekly  Seated Scapular Retraction - 5 reps - 1 sets - 10 hold - 2x daily - 7x weekly
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
## 2018-07-04 ENCOUNTER — Ambulatory Visit: Payer: Medicare Other | Admitting: Physical Therapy

## 2018-07-04 ENCOUNTER — Encounter: Payer: Self-pay | Admitting: Physical Therapy

## 2018-07-04 DIAGNOSIS — M25612 Stiffness of left shoulder, not elsewhere classified: Secondary | ICD-10-CM | POA: Diagnosis not present

## 2018-07-04 DIAGNOSIS — R252 Cramp and spasm: Secondary | ICD-10-CM | POA: Diagnosis not present

## 2018-07-04 DIAGNOSIS — M25512 Pain in left shoulder: Secondary | ICD-10-CM | POA: Diagnosis not present

## 2018-07-04 NOTE — Patient Instructions (Signed)
Access Code: [email or web address]  URL: [email or web address]  Date: 07/04/2018  Prepared by: Kerin Perna   Exercises  Median Nerve Tensioner - 5 reps - 1 sets - 10 hold - 2x daily - 7x weekly  Standing Scapular Retraction - 10 reps - 1 sets - 10 hold - 3x daily - 7x weekly  L's - 10 reps - 1 sets - 5 hold - 3x daily - 7x weekly  Supine Shoulder Flexion Extension AAROM with Dowel - 10 reps - 1 sets - 3-5 hold - 2x daily - 7x weekly  W's - this is a stick up position - 10 reps - 1 sets - 5 seconds hold - 3x daily - 7x weekly  Standing Bilateral Shoulder Internal Rotation AAROM with Dowel - 10 reps - 1 sets - 3-5 hold - 2x daily - 7x weekly  Standing Shoulder Extension with Dowel - 10 reps - 1 sets - 3-5 hold - 2x daily - 7x weekly
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
## 2018-07-11 ENCOUNTER — Ambulatory Visit: Payer: Medicare Other | Admitting: Physical Therapy

## 2018-07-11 ENCOUNTER — Encounter: Payer: Self-pay | Admitting: Physical Therapy

## 2018-07-11 DIAGNOSIS — M25612 Stiffness of left shoulder, not elsewhere classified: Secondary | ICD-10-CM | POA: Diagnosis not present

## 2018-07-11 DIAGNOSIS — R252 Cramp and spasm: Secondary | ICD-10-CM | POA: Diagnosis not present

## 2018-07-11 DIAGNOSIS — M25512 Pain in left shoulder: Secondary | ICD-10-CM | POA: Diagnosis not present
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
## 2018-07-16 ENCOUNTER — Encounter: Payer: Self-pay | Admitting: Physical Therapy

## 2018-07-16 ENCOUNTER — Ambulatory Visit: Payer: Medicare Other | Attending: Family Medicine | Admitting: Physical Therapy

## 2018-07-16 DIAGNOSIS — R252 Cramp and spasm: Secondary | ICD-10-CM

## 2018-07-16 DIAGNOSIS — M25512 Pain in left shoulder: Secondary | ICD-10-CM

## 2018-07-16 DIAGNOSIS — M25612 Stiffness of left shoulder, not elsewhere classified: Secondary | ICD-10-CM | POA: Diagnosis not present

## 2018-07-16 NOTE — Therapy (Signed)
[place] [address] [place], [place], [address] Phone: [phone]   Fax:  [phone]  Physical Therapy Treatment  Patient Details  Name: Kelly [name] MRN: [id number] Date of Birth: [birth year] Referring Provider (PT): Creig Hines   Encounter Date: 07/16/2018  PT End of Session - 07/16/18 0834    Visit Number  4    Date for PT Re-Evaluation  08/25/18    PT Start Time  0751    PT Stop Time  0847    PT Time Calculation (min)  56 min    Activity Tolerance  Patient tolerated treatment well    Behavior During Therapy  [facility] for tasks assessed/performed       Past Medical History:  Diagnosis Date  . Allergy   . Blood transfusion without reported diagnosis   . Breast mass    fibocystic breast dx  . Hepatitis    CMV 1992  . Hyperlipidemia   . Postmenopausal HRT (hormone replacement therapy)   . Scarlet fever as a teen    Past Surgical History:  Procedure Laterality Date  . APPENDECTOMY    . BUNIONECTOMY    . caesarean section    . COLONOSCOPY    . CORRECTION HAMMER TOE    . FOOT TENDON SURGERY     left   . POLYPECTOMY    . TONSILLECTOMY      There were no vitals filed for this visit.  Subjective Assessment - 07/16/18 0753    Subjective  I think my motions are getting better, but it hurts if I push it.    Currently in Pain?  Yes    Pain Score  2     Pain Location  Shoulder    Pain Orientation  Left;Right    Pain Relieving Factors  the treatment helps         [facility] PT Assessment - 07/16/18 0001      AROM   Left Shoulder Internal Rotation  45 Degrees    Left Shoulder External Rotation  50 Degrees                   [facility] Adult PT Treatment/Exercise - 07/16/18 0001      Shoulder Exercises: Standing   External Rotation  20 reps;Both;Theraband    Theraband Level (Shoulder External Rotation)  Level 1 (Yellow)    Extension  Both;20 reps;Theraband    Theraband Level (Shoulder  Extension)  Level 2 (Red)    Other Standing Exercises  weighted ball overhead lift    Other Standing Exercises  AAROM with PT overpressure with wand exercises all motions      Shoulder Exercises: ROM/Strengthening   Nustep  level 4 x 6 minutes    Lat Pull  1.5 plate;20 reps    Cybex Row  20 reps;1.5 plate    "W" Arms  10 times      Shoulder Exercises: Stretch   Other Shoulder Stretches  pec stretch in door, unilateral mid level with elbow straight, 15 sec x 2 reps each side (improved tolerance);  bicep stretch holding door frame, unilateral x 15 sec x 2 reps each arm .      Modalities   Modalities  Electrical Stimulation;Moist Heat      Moist Heat Therapy   Number Minutes Moist Heat  15 Minutes    Moist Heat Location  Shoulder;Lumbar Spine      Electrical Stimulation   Electrical Stimulation Location  left  shoulder    Electrical Stimulation Action  IFC    Electrical Stimulation Parameters  sitting    Electrical Stimulation Goals  Pain      Manual Therapy   Manual Therapy  Passive ROM    Passive ROM  to both shoulders all GH joint motions to end range               PT Short Term Goals - 07/16/18 0836      PT SHORT TERM GOAL #1   Title  independent with initial HEP    Status  Achieved        PT Long Term Goals - 07/16/18 0836      PT LONG TERM GOAL #1   Title  decreaes pain 50%    Status  On-going      PT LONG TERM GOAL #2   Title  increase shoulder IR to 70 degrees    Status  Partially Met      PT LONG TERM GOAL #3   Title  safe return to gym    Status  Partially Met      PT LONG TERM GOAL #4   Title  no difficulty dressing    Status  On-going      PT LONG TERM GOAL #5   Title  drive without pain    Status  On-going            Plan - 07/16/18 0834    Clinical Impression Statement  Patient very tight in the biceps and the pectoral area, she has pain with any horizontal abduction and reaching out.  She is gaining ROM and reports easier to  dress.  Still some spasms in the upper traps and the cervical area, needs cues for posture during exercises    PT Next Visit Plan  continue to push ROM and function    Consulted and Agree with Plan of Care  Patient       Patient will benefit from skilled therapeutic intervention in order to improve the following deficits and impairments:  Pain, Improper body mechanics, Increased muscle spasms, Postural dysfunction, Impaired UE functional use, Decreased strength, Decreased range of motion, Impaired flexibility  Visit Diagnosis: Acute pain of left shoulder  Cramp and spasm  Stiffness of left shoulder, not elsewhere classified     Problem List Patient Active Problem List   Diagnosis Date Noted  . 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 Neck pain with neck stiffness after whiplash injury to neck 04/11/2017  . Lumbar radiculopathy 04/11/2017  . Whiplash 02/22/2017  . 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 OSA on CPAP 12/23/2015  . Urinary incontinence 03/31/2014  . Fibrocystic breast disease 06/06/2011  . Routine health maintenance 11/12/2010  . Obesity 09/11/2007  . Hyperlipidemia 06/10/2007    Sumner Boast., PT 07/16/2018, 8:37 AM  [place] [address] [name], [place], [address] Phone: [phone]   Fax:  [phone]  Name: Kelly [name] MRN: [id number] Date of Birth: 1940-10-24

## 2018-07-23 ENCOUNTER — Ambulatory Visit: Payer: Medicare Other | Admitting: Physical Therapy

## 2018-07-23 DIAGNOSIS — M25612 Stiffness of left shoulder, not elsewhere classified: Secondary | ICD-10-CM

## 2018-07-23 DIAGNOSIS — M25512 Pain in left shoulder: Secondary | ICD-10-CM

## 2018-07-23 DIAGNOSIS — R252 Cramp and spasm: Secondary | ICD-10-CM

## 2018-07-23 NOTE — Therapy (Signed)
[place], [place], [address] Phone: [phone]   Fax:  [phone]  Physical Therapy Treatment  Patient Details  Name: Kelly [name] MRN: [id number] Date of Birth: 1941-01-09 Referring Provider (PT): Creig Hines   Encounter Date: 07/23/2018  PT End of Session - 07/23/18 0848    Visit Number  5    Date for PT Re-Evaluation  08/25/18    PT Start Time  0845    PT Stop Time  0941    PT Time Calculation (min)  56 min    Activity Tolerance  Patient tolerated treatment well    Behavior During Therapy  [facility] for tasks assessed/performed       Past Medical History:  Diagnosis Date  . Allergy   . Blood transfusion without reported diagnosis   . Breast mass    fibocystic breast dx  . Hepatitis    CMV 1992  . Hyperlipidemia   . Postmenopausal HRT (hormone replacement therapy)   . Scarlet fever as a teen    Past Surgical History:  Procedure Laterality Date  . APPENDECTOMY    . BUNIONECTOMY    . caesarean section    . COLONOSCOPY    . CORRECTION HAMMER TOE    . FOOT TENDON SURGERY     left   . POLYPECTOMY    . TONSILLECTOMY      There were no vitals filed for this visit.  Subjective Assessment - 07/23/18 0849    Subjective  Pt reports she is sore from her exercise class at [facility].  She had a massage over the weekend, "I can tell a big difference". She is able to complete supine flexion dowel exercise with greater ease. She can still feel pain in front of Rt shoulder with turning wheel to the Rt.     Patient Stated Goals  have less pain, less difficulty with ADL's    Currently in Pain?  No/denies    Pain Score  0-No pain         [facility] PT Assessment - 07/23/18 0001      Assessment   Medical Diagnosis  left shoulder RC tear, bilateral biceps pain and neck pain    Referring Provider (PT)  Z. Smith    Onset Date/Surgical Date  04/25/18    Hand Dominance  Right    Next MD Visit  not  scheduled yet    Prior Therapy  none      AROM   Right/Left Shoulder  Right;Left    Right Shoulder Extension  42 Degrees    Right Shoulder Flexion  152 Degrees    Right Shoulder ABduction  150 Degrees   pain in deltoid at end range   Right Shoulder Internal Rotation  --   thumb to L4   Right Shoulder External Rotation  90 Degrees    Left Shoulder Extension  35 Degrees    Left Shoulder Flexion  150 Degrees    Left Shoulder ABduction  145 Degrees    Left Shoulder Internal Rotation  --   thumb to L3   Left Shoulder External Rotation  70 Degrees        [facility] Adult PT Treatment/Exercise - 07/23/18 0001      Shoulder Exercises: Standing   External Rotation  Both;Theraband;10 reps   2 sets   Theraband Level (Shoulder External Rotation)  Level 1 (Yellow)    Internal Rotation  AAROM;20 reps   cane behind  back, up over buttocks, then side to side.   Flexion  AROM;Both;15 reps   cane, overhead      Shoulder Exercises: ROM/Strengthening   UBE (Upper Arm Bike)  L1-2: forward x 3 min, backward 2 min     Lat Pull  2 plate;10 reps   10#   Cybex Row  2 plate;15 reps   10#     Shoulder Exercises: Stretch   Other Shoulder Stretches  midlevel doorway stretch x 20 sec x 2 reps; high level doorway stretch x 20 sec x 1 reps each side. improved tolerance    Other Shoulder Stretches  unilateral shoulder ext stretch holding door       Modalities   Modalities  Moist Heat      Moist Heat Therapy   Number Minutes Moist Heat  15 Minutes    Moist Heat Location  Shoulder;Lumbar Spine      Electrical Stimulation   Electrical Stimulation Location  Lt/Rt posterior shoulder    Electrical Stimulation Action  premod to each area    Electrical Stimulation Parameters  pt in supported supine    Electrical Stimulation Goals  Pain               PT Short Term Goals - 07/16/18 0836      PT SHORT TERM GOAL #1   Title  independent with initial HEP    Status  Achieved        PT Long Term  Goals - 07/23/18 0901      PT LONG TERM GOAL #1   Title  decrease pain 50%    Time  8    Period  Weeks    Status  Achieved      PT LONG TERM GOAL #2   Title  increase shoulder IR to 70 degrees    Time  8    Period  Weeks    Status  Partially Met      PT LONG TERM GOAL #3   Title  safe return to gym    Time  8    Period  Weeks    Status  Partially Met      PT LONG TERM GOAL #4   Title  no difficulty dressing    Time  8    Period  Weeks    Status  Partially Met      PT LONG TERM GOAL #5   Title  drive without pain    Time  8    Period  Weeks    Status  On-going            Plan - 07/23/18 3818    Clinical Impression Statement  Continued improvement in bilat shoulders; shoulder ROM improved bilat. Continued limitation with IR bilat, ER on Lt.  Pt able to tolerate midlevel doorway stretch with 90 deg abdct, 90 deg ER. Pt has met LTG #1 and is progressing well towards remaining goals.     Rehab Potential  Good    PT Frequency  2x / week    PT Duration  8 weeks    PT Treatment/Interventions  ADLs/Self Care Home Management;Cryotherapy;Electrical Stimulation;Iontophoresis 45m/ml Dexamethasone;Moist Heat;Ultrasound;Therapeutic exercise;Therapeutic activities;Neuromuscular re-education;Patient/family education;Manual techniques;Dry needling    PT Next Visit Plan  continue progressive ROM and functional strengthening to bilat shoulders.     Consulted and Agree with Plan of Care  Patient       Patient will benefit from skilled therapeutic intervention in order to improve the  following deficits and impairments:  Pain, Improper body mechanics, Increased muscle spasms, Postural dysfunction, Impaired UE functional use, Decreased strength, Decreased range of motion, Impaired flexibility  Visit Diagnosis: Acute pain of left shoulder  Cramp and spasm  Stiffness of left shoulder, not elsewhere classified     Problem List Patient Active Problem List   Diagnosis Date Noted   . 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 Neck pain with neck stiffness after whiplash injury to neck 04/11/2017  . Lumbar radiculopathy 04/11/2017  . Whiplash 02/22/2017  . 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 OSA on CPAP 12/23/2015  . Urinary incontinence 03/31/2014  . Fibrocystic breast disease 06/06/2011  . Routine health maintenance 11/12/2010  . Obesity 09/11/2007  . Hyperlipidemia 06/10/2007   Kerin Perna, PTA 07/23/18 12:03 PM  [place] [address] [place], [place], [address] Phone: [phone]   Fax:  [phone]  Name: Kelly [name] MRN: [id number] Date of Birth: 02-01-1941

## 2018-07-30 ENCOUNTER — Ambulatory Visit: Payer: Medicare Other | Admitting: Physical Therapy

## 2018-07-30 ENCOUNTER — Other Ambulatory Visit: Payer: Self-pay

## 2018-07-30 DIAGNOSIS — M25512 Pain in left shoulder: Secondary | ICD-10-CM | POA: Diagnosis not present

## 2018-07-30 DIAGNOSIS — R252 Cramp and spasm: Secondary | ICD-10-CM | POA: Diagnosis not present

## 2018-07-30 DIAGNOSIS — M25612 Stiffness of left shoulder, not elsewhere classified: Secondary | ICD-10-CM

## 2018-07-30 NOTE — Therapy (Signed)
[place], [place], [address] Phone: [phone]   Fax:  [phone]  Physical Therapy Treatment  Patient Details  Name: Kelly [name] MRN: [id number] Date of Birth: 12-Dec-1940 Referring Provider (PT): Creig Hines   Encounter Date: 07/30/2018  PT End of Session - 07/30/18 0854    Visit Number  6    Date for PT Re-Evaluation  08/25/18    PT Start Time  0848    PT Stop Time  0943    PT Time Calculation (min)  55 min    Activity Tolerance  Patient tolerated treatment well    Behavior During Therapy  [facility] for tasks assessed/performed       Past Medical History:  Diagnosis Date  . Allergy   . Blood transfusion without reported diagnosis   . Breast mass    fibocystic breast dx  . Hepatitis    CMV 1992  . Hyperlipidemia   . Postmenopausal HRT (hormone replacement therapy)   . Scarlet fever as a teen    Past Surgical History:  Procedure Laterality Date  . APPENDECTOMY    . BUNIONECTOMY    . caesarean section    . COLONOSCOPY    . CORRECTION HAMMER TOE    . FOOT TENDON SURGERY     left   . POLYPECTOMY    . TONSILLECTOMY      There were no vitals filed for this visit.  Subjective Assessment - 07/30/18 0856    Subjective  Pt reports she feels like she is improving. She is having less difficulty with dressing, but still has soreness (in lateral shoulders) with driving.     Patient Stated Goals  have less pain, less difficulty with ADL's    Currently in Pain?  No/denies    Pain Score  0-No pain   up to 3-4/10 with driving   Pain Location  Shoulder         [facility] PT Assessment - 07/30/18 0001      Assessment   Medical Diagnosis  left shoulder RC tear, bilateral biceps pain and neck pain    Referring Provider (PT)  Z. Smith    Onset Date/Surgical Date  04/25/18    Hand Dominance  Right    Next MD Visit  not scheduled yet    Prior Therapy  none      AROM   AROM Assessment Site   Cervical    Cervical - Right Side Bend  40    Cervical - Left Side Bend  30    Cervical - Right Rotation  50    Cervical - Left Rotation  48        [facility] Adult PT Treatment/Exercise - 07/30/18 0001      Shoulder Exercises: Standing   External Rotation  Strengthening;Both;10 reps;Theraband   2 sets; cues for form.    Theraband Level (Shoulder External Rotation)  Level 1 (Yellow)    Flexion  Both;5 reps    Theraband Level (Shoulder Flexion)  Level 1 (Yellow)   bilat, overhead pull    Row  Both;10 reps;Theraband; 2 sets    Theraband Level (Shoulder Row)  Level 3 (Green)      Shoulder Exercises: ROM/Strengthening   UBE (Upper Arm Bike)  L2: forward x 3 min, backward 2 min       Shoulder Exercises: Stretch   Cross Chest Stretch  2 reps;20 seconds    Internal Rotation Stretch  --  10 reps, with cane.    Wall Stretch - Flexion  5 reps   Lt shoulder   Other Shoulder Stretches  midlevel doorway stretch x 20 sec x 2 reps, high position x 20 sec each arm (some pain in Lt posterior shoulder)    improved tolerance  Shoulder ext with cane behind back x 10 reps      Moist Heat Therapy   Number Minutes Moist Heat  15 Minutes    Moist Heat Location  Shoulder      Electrical Stimulation   Electrical Stimulation Location  Lt/Rt posterior shoulder    Electrical Stimulation Action  premod to each area    Electrical Stimulation Parameters  pt in supported supine    Electrical Stimulation Goals  Pain      Manual Therapy   Manual Therapy  Soft tissue mobilization    Manual therapy comments  pt seated    Soft tissue mobilization  IASTM to posterior Lt shoulder to decrease fascial tightness and improve ROM.               PT Short Term Goals - 07/16/18 0836      PT SHORT TERM GOAL #1   Title  independent with initial HEP    Status  Achieved        PT Long Term Goals - 07/30/18 0859      PT LONG TERM GOAL #1   Title  decrease pain 50%    Time  8    Period  Weeks     Status  Achieved      PT LONG TERM GOAL #2   Title  increase shoulder IR to 70 degrees    Time  8    Period  Weeks    Status  Partially Met      PT LONG TERM GOAL #3   Title  safe return to gym    Time  8    Period  Weeks    Status  Partially Met      PT LONG TERM GOAL #4   Title  no difficulty dressing    Time  8    Period  Weeks    Status  Partially Met      PT LONG TERM GOAL #5   Title  drive without pain    Time  8    Period  Weeks    Status  On-going            Plan - 07/30/18 0936    Clinical Impression Statement  Pt's cervical ROM improving.  Pt continues to have persistant pain with abduction in bilat lateral/posterior shoulders. Point tender with IASTM to posterior Lt shoulder (teres major/infraspinatus).  Pt reported reduction of discomfort with estim/MHP at end of session.  Pt making good gains towards remaining goals each visit.     Rehab Potential  Good    PT Frequency  2x / week    PT Duration  8 weeks    PT Treatment/Interventions  ADLs/Self Care Home Management;Cryotherapy;Electrical Stimulation;Iontophoresis 61m/ml Dexamethasone;Moist Heat;Ultrasound;Therapeutic exercise;Therapeutic activities;Neuromuscular re-education;Patient/family education;Manual techniques;Dry needling    PT Next Visit Plan  continue progressive ROM and functional strengthening to bilat shoulders.     Consulted and Agree with Plan of Care  Patient       Patient will benefit from skilled therapeutic intervention in order to improve the following deficits and impairments:  Pain, Improper body mechanics, Increased muscle spasms, Postural dysfunction, Impaired UE functional use, Decreased  strength, Impaired flexibility, Decreased range of motion  Visit Diagnosis: Acute pain of left shoulder  Cramp and spasm  Stiffness of left shoulder, not elsewhere classified     Problem List Patient Active Problem List   Diagnosis Date Noted  . 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  Neck pain with neck stiffness after whiplash injury to neck 04/11/2017  . Lumbar radiculopathy 04/11/2017  . Whiplash 02/22/2017  . 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 OSA on CPAP 12/23/2015  . Urinary incontinence 03/31/2014  . Fibrocystic breast disease 06/06/2011  . Routine health maintenance 11/12/2010  . Obesity 09/11/2007  . Hyperlipidemia 06/10/2007   Kerin Perna, PTA 07/30/18 12:08 PM  [place] [address] [place], [place], [address] Phone: [phone]   Fax:  [phone]  Name: Kelly [name] MRN: [id number] Date of Birth: 10-06-1940

## 2018-07-30 NOTE — Patient Instructions (Addendum)
Resisted External Rotation: in Neutral - Bilateral    Sit or stand, tubing in both hands, elbows at sides, bent to 90, forearms forward. Pinch shoulder blades together and rotate forearms out. Keep elbows at sides. Repeat __10__ times per set. Do _2___ sets per session. Do __1__ sessions per day.

## 2018-07-31 ENCOUNTER — Encounter: Payer: Self-pay | Admitting: Family Medicine

## 2018-08-09 DIAGNOSIS — G4733 Obstructive sleep apnea (adult) (pediatric): Secondary | ICD-10-CM | POA: Diagnosis not present

## 2018-08-14 ENCOUNTER — Telehealth: Payer: Self-pay

## 2018-08-14 NOTE — Telephone Encounter (Signed)
Spoke with patient who will keep her appointment.

## 2018-08-14 NOTE — Telephone Encounter (Signed)
Copied from [place] [phone]. Topic: General - Call Back - No Documentation >> Aug 13, 2018  3:38 PM Yvette Rack wrote: Reason for CRM: Pt returned call to [facility]. Pt requests call back. Cb# [phone]

## 2018-08-14 NOTE — Telephone Encounter (Signed)
Copied from [place] [phone]. Topic: General - Other >> Aug 14, 2018 10:46 AM Carolyn Stare wrote:  Pt return [facility] call an would like a call back

## 2018-08-14 NOTE — Telephone Encounter (Signed)
Left message for patient to call back  

## 2018-08-14 NOTE — Telephone Encounter (Signed)
Spoke with patient who wants to keep her appointment.

## 2018-08-21 ENCOUNTER — Encounter: Payer: Self-pay | Admitting: Physical Therapy

## 2018-08-28 ENCOUNTER — Telehealth: Payer: Self-pay

## 2018-08-28 ENCOUNTER — Encounter: Payer: Self-pay | Admitting: Family Medicine

## 2018-08-28 ENCOUNTER — Ambulatory Visit (INDEPENDENT_AMBULATORY_CARE_PROVIDER_SITE_OTHER): Payer: Medicare Other | Admitting: Family Medicine

## 2018-08-28 DIAGNOSIS — M5416 Radiculopathy, lumbar region: Secondary | ICD-10-CM | POA: Diagnosis not present

## 2018-08-28 DIAGNOSIS — S134XXA Sprain of ligaments of cervical spine, initial encounter: Secondary | ICD-10-CM | POA: Diagnosis not present

## 2018-08-28 DIAGNOSIS — M5412 Radiculopathy, cervical region: Secondary | ICD-10-CM

## 2018-08-28 NOTE — Assessment & Plan Note (Signed)
Patient does describe some increasing low back pain.  Has had lumbar radiculopathy previously.  Patient has an MRI showing some degenerative disc disease as well as some facet arthropathy causing some nerve impingement.  Discussed with patient in great length about icing regimen, home exercise, which activities to do which wants to avoid.  Patient is to increase activity slowly over the course of next several days.  Follow-up with me again 4 to 8 weeks

## 2018-08-28 NOTE — Telephone Encounter (Signed)
Left message for patient to call back to schedule 3 week f/u.

## 2018-08-28 NOTE — Progress Notes (Signed)
[name] [place],  [address] Phone: [phone] Subjective:    Virtual Visit via Video Note  I connected with Kelly [name] on 08/28/18 at  9:15 AM EDT by a video enabled telemedicine application and verified that I am speaking with the correct person using two identifiers.   I discussed the limitations of evaluation and management by telemedicine and the availability of in person appointments. The patient expressed understanding and agreed to proceed.  Patient was in her home residence and I was in the office setting.  We were the only individuals on the virtual call.    I discussed the assessment and treatment plan with the patient. The patient was provided an opportunity to ask questions and all were answered. The patient agreed with the plan and demonstrated an understanding of the instructions.   The patient was advised to call back or seek an in-person evaluation if the symptoms worsen or if the condition fails to improve as anticipated.  I provided 31 minutes of face-to-face time during this encounter.   Lyndal Pulley, DO    CC: Neck pain and low back pain follow-up  BDZ:[id number]  [name] is a 78 y.o. female coming in with complaint of neck and left shoulder pain patient states that the left shoulder seems to be doing relatively well.  Continues to have some neck pain.  Was doing better though with certain activities initially and was doing well with physical therapy.  Now having some discomfort again.  Patient states that it is not debilitating.  Has noticed some decreased range of motion of the neck.  Patient denies significant radiation down either the shoulders but may be mild on the left arm.  Is also complaining of some dull, throbbing aching pain of the lower back.    Past Medical History:  Diagnosis Date  . Allergy   . Blood transfusion without reported diagnosis   . Breast mass    fibocystic  breast dx  . Hepatitis    CMV 1992  . Hyperlipidemia   . Postmenopausal HRT (hormone replacement therapy)   . Scarlet fever as a teen   Past Surgical History:  Procedure Laterality Date  . APPENDECTOMY    . BUNIONECTOMY    . caesarean section    . COLONOSCOPY    . CORRECTION HAMMER TOE    . FOOT TENDON SURGERY     left   . POLYPECTOMY    . TONSILLECTOMY     Social History   Socioeconomic History  . Marital status: Single    Spouse name: Not on file  . Number of children: 2  . Years of education: 10  . Highest education level: Not on file  Occupational History  . Occupation: MORTGAGE Surveyor, mining: [employer]    Comment: webinars on Hotel manager: [place]  . Financial resource strain: Not hard at all  . Food insecurity:    Worry: Never true    Inability: Never true  . Transportation needs:    Medical: No    Non-medical: No  Tobacco Use  . Smoking status: Never Smoker  . Smokeless tobacco: Never Used  Substance and Sexual Activity  . Alcohol use: Yes    Comment: rarely will have a glass of wine  . Drug use: No  . Sexual activity: Yes    Partners: Male  Lifestyle  . Physical activity:  Days per week: 3 days    Minutes per session: 50 min  . Stress: Not at all  Relationships  . Social connections:    Talks on phone: More than three times a week    Gets together: More than three times a week    Attends religious service: More than 4 times per year    Active member of club or organization: Yes    Attends meetings of clubs or organizations: More than 4 times per year    Relationship status: Not on file  Other Topics Concern  . Not on file  Social History Narrative   [name]; Master's [employer]. Lives alone and independently. Married '67--24 yrs. - widowed '92.  2 dtrs - '69, '73; 6 grandchildren. ACP/EoL - yes CPR, yes for short term ventilation, no prolonged heroic care  in an irreversible state.    Allergies  Allergen Reactions  . Amoxicillin Rash  . Codeine Nausea Only   Family History  Problem Relation Age of Onset  . Pancreatic cancer Mother   . Hyperlipidemia Mother   . Hypertension Mother   . Polymyalgia rheumatica Mother   . Dementia Father     Current Outpatient Medications (Endocrine & Metabolic):  .  estradiol (CLIMARA - DOSED IN MG/24 HR) 0.05 mg/24hr patch, 1/2 patch placed on the skin weekly .  predniSONE (DELTASONE) 50 MG tablet, Take 1 tablet (50 mg total) by mouth daily. .  progesterone (PROMETRIUM) 200 MG capsule, Take 1 capsule (200 mg total) by mouth as directed. 12 days out of every 6 months. (Patient taking differently: Take 200 mg by mouth as directed. 2 months 14 days then skip a month)  Current Outpatient Medications (Cardiovascular):  .  atorvastatin (LIPITOR) 40 MG tablet, TAKE 1 TABLET BY MOUTH  DAILY     Current Outpatient Medications (Other):  .  estradiol (ESTRACE) 0.1 MG/GM vaginal cream, Place 6.81 Applicatorfuls vaginally every Wednesday and Saturday. .  multivitamin (THERAGRAN) per tablet, Take 1 tablet by mouth daily.   Marland Kitchen  PREMARIN vaginal cream, Place 1 Applicatorful vaginally 2 (two) times a week.     Past medical history, social, surgical and family history all reviewed in electronic medical record.  No pertanent information unless stated regarding to the chief complaint.   Review of Systems:  No headache, visual changes, nausea, vomiting, diarrhea, constipation, dizziness, abdominal pain, skin rash, fevers, chills, night sweats, weight loss, swollen lymph nodes, body aches, joint swelling,  chest pain, shortness of breath, mood changes.  Positive muscle aches  Objective     General: No apparent distress alert and oriented x3 mood and affect normal, dressed appropriately.     Impression and Recommendations:      The above documentation has been reviewed and is accurate and complete Lyndal Pulley, DO       Note: This dictation was prepared with Dragon dictation along with smaller phrase technology. Any transcriptional errors that result from this process are unintentional.

## 2018-08-28 NOTE — Assessment & Plan Note (Signed)
Patient has had an MRI showing cervical spinal stenosis and facet arthropathy.  Responded well to an epidural 15 months ago.  Reordered.  Do not want to do any medications secondary to multiple side effects.  Discussed icing regimen and home exercises.  Hold on physical therapy until coronavirus outbreak is more well controlled.  Follow-up again in 3 weeks which virtual

## 2018-08-29 ENCOUNTER — Encounter: Payer: Self-pay | Admitting: Family Medicine

## 2018-08-29 NOTE — Telephone Encounter (Signed)
Patient scheduled.

## 2018-08-30 ENCOUNTER — Ambulatory Visit: Payer: Medicare Other | Admitting: Family Medicine

## 2018-09-18 ENCOUNTER — Ambulatory Visit: Payer: Medicare Other | Admitting: Family Medicine

## 2018-09-24 ENCOUNTER — Ambulatory Visit: Payer: Medicare Other | Admitting: Physical Therapy

## 2018-09-25 ENCOUNTER — Encounter: Payer: Self-pay | Admitting: Physical Therapy

## 2018-09-25 ENCOUNTER — Ambulatory Visit: Payer: Medicare Other | Attending: Family Medicine | Admitting: Physical Therapy

## 2018-09-25 ENCOUNTER — Other Ambulatory Visit: Payer: Self-pay

## 2018-09-25 DIAGNOSIS — M25612 Stiffness of left shoulder, not elsewhere classified: Secondary | ICD-10-CM

## 2018-09-25 DIAGNOSIS — M5441 Lumbago with sciatica, right side: Secondary | ICD-10-CM

## 2018-09-25 DIAGNOSIS — M25512 Pain in left shoulder: Secondary | ICD-10-CM

## 2018-09-25 DIAGNOSIS — R252 Cramp and spasm: Secondary | ICD-10-CM | POA: Diagnosis not present

## 2018-09-25 NOTE — Therapy (Signed)
[facility] [address] [place], [place], [address] Phone: [phone]   Fax:  [phone]  Physical Therapy Evaluation  Patient Details  Name: Kelly [name] MRN: [id number] Date of Birth: [birth year] Referring Provider (PT): Creig Hines   Encounter Date: 09/25/2018  PT End of Session - 09/25/18 1527    Visit Number  7    Date for PT Re-Evaluation  11/25/18    PT Start Time  1435    PT Stop Time  1535    PT Time Calculation (min)  60 min    Activity Tolerance  Patient tolerated treatment well    Behavior During Therapy  [facility] for tasks assessed/performed       Past Medical History:  Diagnosis Date  . Allergy   . Blood transfusion without reported diagnosis   . Breast mass    fibocystic breast dx  . Hepatitis    CMV 1992  . Hyperlipidemia   . Postmenopausal HRT (hormone replacement therapy)   . Scarlet fever as a teen    Past Surgical History:  Procedure Laterality Date  . APPENDECTOMY    . BUNIONECTOMY    . caesarean section    . COLONOSCOPY    . CORRECTION HAMMER TOE    . FOOT TENDON SURGERY     left   . POLYPECTOMY    . TONSILLECTOMY      There were no vitals filed for this visit.   Subjective Assessment - 09/25/18 1438    Subjective  Patient has not been in for the past 2 months.  We stopped seeing her due to Covid-19.  She also reports that she had to move out of her house due to "black mold"  She reports that she is having right hip and buttock pain.  She reports that she really tried the exercises but has slacked off, she reports htat she lost her job and has a lot of stress    Currently in Pain?  Yes    Pain Score  2     Pain Location  Back    Pain Orientation  Right;Lower    Pain Descriptors / Indicators  Aching;Tightness    Aggravating Factors   walking, stairs pain up to 7/10    Pain Relieving Factors  the treatment was helping         [facility] PT Assessment - 09/25/18 0001      AROM    Overall AROM Comments  cervical ROM decreased 50% for rotation, decreased 25% for extension, WNL;s for flexion, lumbar ROM is decreased >50% with pain in the right low back and the right buttock    Left Shoulder Flexion  135 Degrees    Left Shoulder ABduction  130 Degrees    Left Shoulder Internal Rotation  50 Degrees    Left Shoulder External Rotation  60 Degrees      Flexibility   Soft Tissue Assessment /Muscle Length  yes    Hamstrings  very tight and painful    ITB  tight and painful    Piriformis  replicates her pain, tight and painful      Palpation   Palpation comment  she is tight and tender in the low back, into the right more and then into the right buttock the ischial tuberosity and the right ITB and GT area      Ambulation/Gait   Gait Comments  she is limping on the right, toe out gait.  She is having some difficulty straightening up when walking.                Objective measurements completed on examination: See above findings.      [facility] Adult PT Treatment/Exercise - 09/25/18 0001      Modalities   Modalities  Electrical Stimulation;Moist Heat      Moist Heat Therapy   Number Minutes Moist Heat  15 Minutes    Moist Heat Location  Lumbar Spine;Hip      Electrical Stimulation   Electrical Stimulation Location  right low back and into the buttock    Electrical Stimulation Action  IFC    Electrical Stimulation Parameters  supine    Electrical Stimulation Goals  Pain      Manual Therapy   Manual Therapy  Soft tissue mobilization;Passive ROM    Soft tissue mobilization  STM to the right buttock, right low back and into the right ITB    Passive ROM  passive stretch to the HS, ITB and piriformis on the right               PT Short Term Goals - 07/16/18 0836      PT SHORT TERM GOAL #1   Title  independent with initial HEP    Status  Achieved        PT Long Term Goals - 09/25/18 1540      PT LONG TERM GOAL #1   Title  decrease pain  50%    Status  On-going      PT LONG TERM GOAL #2   Title  increase shoulder IR to 70 degrees    Time  8    Period  Weeks    Status  On-going      PT LONG TERM GOAL #3   Title  safe return to gym    Period  Weeks    Status  On-going      PT LONG TERM GOAL #4   Title  no difficulty dressing    Time  8    Period  Weeks    Status  Partially Met      PT LONG TERM GOAL #5   Title  drive without pain    Status  On-going             Plan - 09/25/18 1538    Clinical Impression Statement  Patient has not been in to see PT in 2 months due to the Covid-19 issues.  She returns today with some loss of motion, she also is having a lot more right low back, buttock and leg pain, seems to have some sciatica issues, she is very tight in the HS, ITB and especially the piriformis mm.  She is limping on the right leg and having difficulty straightening up to stand.  Her neck and shoulder issues are better but still with issues.  We will resume treatment and add treatment for the LBP and hip pain    Rehab Potential  Good    PT Frequency  2x / week    PT Duration  8 weeks    PT Treatment/Interventions  ADLs/Self Care Home Management;Cryotherapy;Electrical Stimulation;Iontophoresis 81m/ml Dexamethasone;Moist Heat;Ultrasound;Therapeutic exercise;Therapeutic activities;Neuromuscular re-education;Patient/family education;Manual techniques;Dry needling    PT Next Visit Plan  continue to treat the neck and shoulders add treatment ot he back and hip    Consulted and Agree with Plan of Care  Patient       Patient will benefit from  skilled therapeutic intervention in order to improve the following deficits and impairments:  Pain, Improper body mechanics, Increased muscle spasms, Postural dysfunction, Impaired UE functional use, Decreased strength, Impaired flexibility, Decreased range of motion  Visit Diagnosis: Acute pain of left shoulder - Plan: PT plan of care cert/re-cert  Cramp and spasm - Plan:  PT plan of care cert/re-cert  Stiffness of left shoulder, not elsewhere classified - Plan: PT plan of care cert/re-cert  Acute right-sided low back pain with right-sided sciatica - Plan: PT plan of care cert/re-cert     Problem List Patient Active Problem List   Diagnosis Date Noted  . 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 Neck pain with neck stiffness after whiplash injury to neck 04/11/2017  . Lumbar radiculopathy 04/11/2017  . Whiplash 02/22/2017  . 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 OSA on CPAP 12/23/2015  . Urinary incontinence 03/31/2014  . Fibrocystic breast disease 06/06/2011  . Routine health maintenance 11/12/2010  . Obesity 09/11/2007  . Hyperlipidemia 06/10/2007    Sumner Boast., PT 09/25/2018, 3:43 PM  [address] [place], [place], [address] Phone: [phone]   Fax:  [phone]  Name: Kelly [name] MRN: [id number] Date of Birth: 04/24/1941

## 2018-09-25 NOTE — Patient Instructions (Signed)
Access Code: [email or web address]  URL: [email or web address]  Date: 09/25/2018  Prepared by: Lum Babe   Exercises  Supine Piriformis Stretch Pulling Heel to Hip - 5 reps - 1 sets - 15 hold - 2x daily - 7x weekly

## 2018-09-26 ENCOUNTER — Other Ambulatory Visit: Payer: Self-pay | Admitting: Internal Medicine

## 2018-09-30 ENCOUNTER — Encounter: Payer: Self-pay | Admitting: Physical Therapy

## 2018-09-30 ENCOUNTER — Ambulatory Visit: Payer: Medicare Other | Admitting: Physical Therapy

## 2018-09-30 ENCOUNTER — Other Ambulatory Visit: Payer: Self-pay

## 2018-09-30 DIAGNOSIS — M25612 Stiffness of left shoulder, not elsewhere classified: Secondary | ICD-10-CM

## 2018-09-30 DIAGNOSIS — M5441 Lumbago with sciatica, right side: Secondary | ICD-10-CM

## 2018-09-30 DIAGNOSIS — M25512 Pain in left shoulder: Secondary | ICD-10-CM | POA: Diagnosis not present

## 2018-09-30 DIAGNOSIS — R252 Cramp and spasm: Secondary | ICD-10-CM

## 2018-09-30 NOTE — Therapy (Signed)
[facility] [address] [place], [place], [address] Phone: [phone]   Fax:  [phone]  Physical Therapy Treatment  Patient Details  Name: Naviah Belfield MRN: [id number] Date of Birth: April 13, 1941 Referring Provider (PT): Creig Hines   Encounter Date: 09/30/2018  PT End of Session - 09/30/18 1520    Visit Number  8    Date for PT Re-Evaluation  11/25/18    PT Start Time  1431    PT Stop Time  1529    PT Time Calculation (min)  58 min    Activity Tolerance  Patient tolerated treatment well    Behavior During Therapy  [facility] for tasks assessed/performed       Past Medical History:  Diagnosis Date  . Allergy   . Blood transfusion without reported diagnosis   . Breast mass    fibocystic breast dx  . Hepatitis    CMV 1992  . Hyperlipidemia   . Postmenopausal HRT (hormone replacement therapy)   . Scarlet fever as a teen    Past Surgical History:  Procedure Laterality Date  . APPENDECTOMY    . BUNIONECTOMY    . caesarean section    . COLONOSCOPY    . CORRECTION HAMMER TOE    . FOOT TENDON SURGERY     left   . POLYPECTOMY    . TONSILLECTOMY      There were no vitals filed for this visit.  Subjective Assessment - 09/30/18 1439    Subjective  Patient reports that the last treatment helped for about a day, reports that today when she was getting out of the car she had to use her hands to get the right leg in and out, reported pain with this    Currently in Pain?  Yes    Pain Score  6     Pain Location  Hip    Pain Orientation  Right    Aggravating Factors   getting in and out of car pain is a 6-7/10                       [facility] Adult PT Treatment/Exercise - 09/30/18 0001      Shoulder Exercises: Supine   Other Supine Exercises  feet on ball K2C, small trunk rotation, small bridge activation and then abdominal activation      Shoulder Exercises: Standing   External Rotation   Strengthening;Both;10 reps;Theraband    Theraband Level (Shoulder External Rotation)  Level 2 (Red)    Extension  Both;20 reps;Theraband    Theraband Level (Shoulder Extension)  Level 2 (Red)    Row  Both;20 reps;Theraband    Theraband Level (Shoulder Row)  Level 2 (Red)    Other Standing Exercises  weighted ball overhead lift      Shoulder Exercises: ROM/Strengthening   Nustep  level 4 x 5 minutes      Modalities   Modalities  Electrical Stimulation;Moist Heat      Moist Heat Therapy   Number Minutes Moist Heat  15 Minutes    Moist Heat Location  Hip      Electrical Stimulation   Electrical Stimulation Location  right low back and into the buttock    Electrical Stimulation Action  IFC    Electrical Stimulation Parameters  supine    Electrical Stimulation Goals  Pain      Manual Therapy   Manual Therapy  Soft tissue mobilization;Passive ROM  Soft tissue mobilization  STM to the right buttock, right low back and into the right ITB    Passive ROM  passive stretch to the HS, ITB and piriformis on the right               PT Short Term Goals - 07/16/18 0836      PT SHORT TERM GOAL #1   Title  independent with initial HEP    Status  Achieved        PT Long Term Goals - 09/25/18 1540      PT LONG TERM GOAL #1   Title  decrease pain 50%    Status  On-going      PT LONG TERM GOAL #2   Title  increase shoulder IR to 70 degrees    Time  8    Period  Weeks    Status  On-going      PT LONG TERM GOAL #3   Title  safe return to gym    Period  Weeks    Status  On-going      PT LONG TERM GOAL #4   Title  no difficulty dressing    Time  8    Period  Weeks    Status  Partially Met      PT LONG TERM GOAL #5   Title  drive without pain    Status  On-going            Plan - 09/30/18 1521    Clinical Impression Statement  Patient is really struggling with the right hip pain, it is changing how she walks, she is very tender today in the upper buttock and  low back and into the right ITB down to the knee at times,  Difficulty in and out of car and the first few steps are very painful    PT Next Visit Plan  continue to treat the neck and shoulders add treatment ot he back and hip    Consulted and Agree with Plan of Care  Patient       Patient will benefit from skilled therapeutic intervention in order to improve the following deficits and impairments:  Pain, Improper body mechanics, Increased muscle spasms, Postural dysfunction, Impaired UE functional use, Decreased strength, Impaired flexibility, Decreased range of motion  Visit Diagnosis: Acute pain of left shoulder  Cramp and spasm  Stiffness of left shoulder, not elsewhere classified  Acute right-sided low back pain with right-sided sciatica     Problem List Patient Active Problem List   Diagnosis Date Noted  . 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 Neck pain with neck stiffness after whiplash injury to neck 04/11/2017  . Lumbar radiculopathy 04/11/2017  . Whiplash 02/22/2017  . 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 OSA on CPAP 12/23/2015  . Urinary incontinence 03/31/2014  . Fibrocystic breast disease 06/06/2011  . Routine health maintenance 11/12/2010  . Obesity 09/11/2007  . Hyperlipidemia 06/10/2007    Sumner Boast., PT 09/30/2018, 3:27 PM  [address] [place], [place], [address] Phone: [phone]   Fax:  [phone]  Name: Makyna Niehoff MRN: [id number] Date of Birth: 16-Apr-1941

## 2018-10-02 ENCOUNTER — Ambulatory Visit (INDEPENDENT_AMBULATORY_CARE_PROVIDER_SITE_OTHER): Payer: Medicare Other | Admitting: Internal Medicine

## 2018-10-02 ENCOUNTER — Encounter: Payer: Self-pay | Admitting: Internal Medicine

## 2018-10-02 DIAGNOSIS — Z77120 Contact with and (suspected) exposure to mold (toxic): Secondary | ICD-10-CM

## 2018-10-02 NOTE — Progress Notes (Signed)
Virtual Visit via Audio only Note  I connected with Kelly [name] on 10/02/18 at 10:40 AM EDT by an audio enabled telemedicine application after video failed to transmit and verified that I am speaking with the correct person using two identifiers.  The patient and the provider were at separate locations throughout the entire encounter.   I discussed the limitations of evaluation and management by telemedicine and the availability of in person appointments. The patient expressed understanding and agreed to proceed.  History of Present Illness: The patient is a 78 y.o. female with visit for exposure to black mold. Started January with some leaking. Noticed by March that the baseboard had expanded, when investigated there was black mold in the baseboards and dry wall in the master bath. She has noticed since that time more sore throat in the evenings and some headaches at night time. She has bathroom door open all the time so is exposed to it in her bedroom. Mold testing done and air levels 64000 for several mold varieties. They are fixing currently. Denies fevers or chills or SOB. Denies sinus pain or pressure. Overall it is stable. Has tried nothing  Observations/Objective: Voice strong, no SOB, A and O times 3  Assessment and Plan: See problem oriented charting  Follow Up Instructions: allergy mold testing, can use zyrtec if needed, remediate mold in the home  I discussed the assessment and treatment plan with the patient. The patient was provided an opportunity to ask questions and all were answered. The patient agreed with the plan and demonstrated an understanding of the instructions.   The patient was advised to call back or seek an in-person evaluation if the symptoms worsen or if the condition fails to improve as anticipated.  Hoyt Koch, MD

## 2018-10-02 NOTE — Assessment & Plan Note (Signed)
This is being removed from the home. She is having some allergic type symptoms so doing mold allergy testing. No indication for antibiotics at this time. Advised can use zyrtec if desired for symptoms. Should improve with removal of mold particles in the air.

## 2018-10-03 ENCOUNTER — Encounter: Payer: Self-pay | Admitting: Physical Therapy

## 2018-10-03 ENCOUNTER — Other Ambulatory Visit: Payer: Self-pay

## 2018-10-03 ENCOUNTER — Ambulatory Visit: Payer: Medicare Other | Admitting: Physical Therapy

## 2018-10-03 ENCOUNTER — Other Ambulatory Visit: Payer: Medicare Other

## 2018-10-03 ENCOUNTER — Ambulatory Visit (INDEPENDENT_AMBULATORY_CARE_PROVIDER_SITE_OTHER)
Admission: RE | Admit: 2018-10-03 | Discharge: 2018-10-03 | Disposition: A | Discharge status: Home / Self Care | Payer: Medicare Other | Source: Ambulatory Visit | Attending: Internal Medicine | Admitting: Internal Medicine

## 2018-10-03 DIAGNOSIS — M5441 Lumbago with sciatica, right side: Secondary | ICD-10-CM | POA: Diagnosis not present

## 2018-10-03 DIAGNOSIS — R252 Cramp and spasm: Secondary | ICD-10-CM

## 2018-10-03 DIAGNOSIS — M25612 Stiffness of left shoulder, not elsewhere classified: Secondary | ICD-10-CM

## 2018-10-03 DIAGNOSIS — M25512 Pain in left shoulder: Secondary | ICD-10-CM | POA: Diagnosis not present

## 2018-10-03 DIAGNOSIS — Z77120 Contact with and (suspected) exposure to mold (toxic): Secondary | ICD-10-CM | POA: Diagnosis not present

## 2018-10-03 DIAGNOSIS — Z780 Asymptomatic menopausal state: Secondary | ICD-10-CM | POA: Diagnosis not present

## 2018-10-03 NOTE — Therapy (Signed)
[place] [place], [place], [address] Phone: [phone]   Fax:  [phone]  Physical Therapy Treatment  Patient Details  Name: Kelly [name] MRN: [id number] Date of Birth: [birth year] Referring Provider (PT): Creig Hines   Encounter Date: 10/03/2018  PT End of Session - 10/03/18 1520    Visit Number  9    Date for PT Re-Evaluation  11/25/18    PT Start Time  2979    PT Stop Time  1540    PT Time Calculation (min)  57 min    Activity Tolerance  Patient tolerated treatment well    Behavior During Therapy  [facility] for tasks assessed/performed       Past Medical History:  Diagnosis Date  . Allergy   . Blood transfusion without reported diagnosis   . Breast mass    fibocystic breast dx  . Hepatitis    CMV 1992  . Hyperlipidemia   . Postmenopausal HRT (hormone replacement therapy)   . Scarlet fever as a teen    Past Surgical History:  Procedure Laterality Date  . APPENDECTOMY    . BUNIONECTOMY    . caesarean section    . COLONOSCOPY    . CORRECTION HAMMER TOE    . FOOT TENDON SURGERY     left   . POLYPECTOMY    . TONSILLECTOMY      There were no vitals filed for this visit.  Subjective Assessment - 10/03/18 1446    Subjective  Patient reports that she has really been feeling better, she does report taking an Aleve but also thought that what we did really helped.    Currently in Pain?  Yes    Pain Score  2     Pain Location  Hip    Pain Orientation  Right                       [facility] Adult PT Treatment/Exercise - 10/03/18 0001      Shoulder Exercises: Supine   Other Supine Exercises  Bridges with ball squeeze and bridges with tband for ahip abduction    Other Supine Exercises  feet on ball K2C, small trunk rotation, small bridge activation and then abdominal activation      Shoulder Exercises: Standing   External Rotation  Strengthening;Both;10 reps;Theraband    Theraband Level (Shoulder External Rotation)  Level 2 (Red)    Extension  Both;20 reps;Theraband    Theraband Level (Shoulder Extension)  Level 2 (Red)    Row  Both;20 reps;Theraband    Theraband Level (Shoulder Row)  Level 2 (Red)      Shoulder Exercises: ROM/Strengthening   Nustep  level 4 x 5 minutes      Modalities   Modalities  Electrical Stimulation;Moist Heat;Iontophoresis      Moist Heat Therapy   Number Minutes Moist Heat  15 Minutes    Moist Heat Location  Hip      Electrical Stimulation   Electrical Stimulation Location  right hip area    Electrical Stimulation Action  IFC    Electrical Stimulation Parameters  supine    Electrical Stimulation Goals  Pain      Iontophoresis   Type of Iontophoresis  Dexamethasone    Location  right hip    Dose  55m    Time  4 hour patch               PT  Short Term Goals - 07/16/18 0836      PT SHORT TERM GOAL #1   Title  independent with initial HEP    Status  Achieved        PT Long Term Goals - 10/03/18 1522      PT LONG TERM GOAL #1   Title  decrease pain 50%    Status  Partially Met      PT LONG TERM GOAL #4   Title  no difficulty dressing    Status  Partially Met      PT LONG TERM GOAL #5   Title  drive without pain    Status  Partially Met            Plan - 10/03/18 1521    Clinical Impression Statement  Patient is much improved today with movements and walking, feels like the treatment that we did or Aleve two days ago really helped.  She still has tenderness in the right buttock and the right GT area.  She has pain with trying to lift the right leg up into the car.    PT Next Visit Plan  add exercises as tolerated    Consulted and Agree with Plan of Care  Patient       Patient will benefit from skilled therapeutic intervention in order to improve the following deficits and impairments:  Pain, Improper body mechanics, Increased muscle spasms, Postural dysfunction, Impaired UE functional use,  Decreased strength, Impaired flexibility, Decreased range of motion  Visit Diagnosis: Acute pain of left shoulder  Cramp and spasm  Stiffness of left shoulder, not elsewhere classified  Acute right-sided low back pain with right-sided sciatica     Problem List Patient Active Problem List   Diagnosis Date Noted  . 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 Neck pain with neck stiffness after whiplash injury to neck 04/11/2017  . Lumbar radiculopathy 04/11/2017  . Whiplash 02/22/2017  . 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 OSA on CPAP 12/23/2015  . Urinary incontinence 03/31/2014  . Fibrocystic breast disease 06/06/2011  . Routine health maintenance 11/12/2010  . Obesity 09/11/2007  . Hyperlipidemia 06/10/2007    Sumner Boast., PT 10/03/2018, 3:23 PM  [address] [place], [place], [address] Phone: [phone]   Fax:  [phone]  Name: Kelly [name] MRN: [id number] Date of Birth: Aug 18, 1940

## 2018-10-04 LAB — ALLERGY PANEL 11, MOLD GROUP
Allergen, A. alternata, m6: <0.1 kU/L
Allergen, Mucor Racemosus, M4: <0.1 kU/L
Aspergillus fumigatus, m3: <0.1 kU/L
CLADOSPORIUM HERBARUM (M2) IGE: <0.1 kU/L
CLASS: 0
CLASS: 0
Candida Albicans: <0.1 kU/L
Class: 0
Class: 0
Class: 0

## 2018-10-04 LAB — INTERPRETATION:

## 2018-10-08 ENCOUNTER — Encounter: Payer: Self-pay | Admitting: Physical Therapy

## 2018-10-08 ENCOUNTER — Ambulatory Visit: Payer: Medicare Other | Admitting: Physical Therapy

## 2018-10-08 DIAGNOSIS — M5441 Lumbago with sciatica, right side: Secondary | ICD-10-CM | POA: Diagnosis not present

## 2018-10-08 DIAGNOSIS — R252 Cramp and spasm: Secondary | ICD-10-CM | POA: Diagnosis not present

## 2018-10-08 DIAGNOSIS — M25512 Pain in left shoulder: Secondary | ICD-10-CM | POA: Diagnosis not present

## 2018-10-08 DIAGNOSIS — M25612 Stiffness of left shoulder, not elsewhere classified: Secondary | ICD-10-CM | POA: Diagnosis not present

## 2018-10-08 NOTE — Therapy (Signed)
[address] [place], [place], [address] Phone: [phone]   Fax:  [phone] Progress Note Reporting Period 06/26/18 to 10/08/18 for the first 10 visits  See note below for Objective Data and Assessment of Progress/Goals.       Physical Therapy Treatment  Patient Details  Name: Kelly [name] MRN: [id number] Date of Birth: 1941/01/06 Referring Provider (PT): Creig Hines   Encounter Date: 10/08/2018  PT End of Session - 10/08/18 1613    Visit Number  10    Date for PT Re-Evaluation  11/25/18    PT Start Time  1445    PT Stop Time  1530    PT Time Calculation (min)  45 min    Activity Tolerance  Patient tolerated treatment well    Behavior During Therapy  [facility] for tasks assessed/performed       Past Medical History:  Diagnosis Date  . Allergy   . Blood transfusion without reported diagnosis   . Breast mass    fibocystic breast dx  . Hepatitis    CMV 1992  . Hyperlipidemia   . Postmenopausal HRT (hormone replacement therapy)   . Scarlet fever as a teen    Past Surgical History:  Procedure Laterality Date  . APPENDECTOMY    . BUNIONECTOMY    . caesarean section    . COLONOSCOPY    . CORRECTION HAMMER TOE    . FOOT TENDON SURGERY     left   . POLYPECTOMY    . TONSILLECTOMY      There were no vitals filed for this visit.  Subjective Assessment - 10/08/18 1518    Subjective  Patient reports that she has been doing a lot of stuff and has had more pain in the right buttock and leg    Currently in Pain?  Yes    Pain Score  4     Pain Location  Hip    Pain Orientation  Right    Pain Descriptors / Indicators  Aching    Aggravating Factors   stress and activity                       [facility] Adult PT Treatment/Exercise - 10/08/18 0001      Modalities   Modalities  Electrical Stimulation;Moist Heat;Iontophoresis      Moist Heat Therapy   Number Minutes Moist Heat  15  Minutes    Moist Heat Location  Hip      Electrical Stimulation   Electrical Stimulation Location  right hip area    Electrical Stimulation Action  IFC    Electrical Stimulation Parameters  supine    Electrical Stimulation Goals  Pain      Manual Therapy   Manual Therapy  Soft tissue mobilization;Passive ROM    Soft tissue mobilization  STM to the right buttock, right low back and into the right ITB    Passive ROM  passive stretch to the HS, ITB and piriformis on the right               PT Short Term Goals - 07/16/18 0836      PT SHORT TERM GOAL #1   Title  independent with initial HEP    Status  Achieved        PT Long Term Goals - 10/03/18 1522      PT LONG TERM GOAL #1   Title  decrease pain 50%  Status  Partially Met      PT LONG TERM GOAL #4   Title  no difficulty dressing    Status  Partially Met      PT LONG TERM GOAL #5   Title  drive without pain    Status  Partially Met            Plan - 10/08/18 1613    Clinical Impression Statement  Patient reports that she was doing well but has been doing "too much" she reports that she is having more hip pain and some leg pain today  She is very tender in the piriformis area and reports pain into the leg with pressure here    PT Next Visit Plan  add exercises as tolerated    Consulted and Agree with Plan of Care  Patient       Patient will benefit from skilled therapeutic intervention in order to improve the following deficits and impairments:  Pain, Improper body mechanics, Increased muscle spasms, Postural dysfunction, Impaired UE functional use, Decreased strength, Impaired flexibility, Decreased range of motion  Visit Diagnosis: Acute pain of left shoulder  Cramp and spasm  Stiffness of left shoulder, not elsewhere classified  Acute right-sided low back pain with right-sided sciatica     Problem List Patient Active Problem List   Diagnosis Date Noted  . 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 Neck pain with neck stiffness after whiplash injury to neck 04/11/2017  . Lumbar radiculopathy 04/11/2017  . Whiplash 02/22/2017  . 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 OSA on CPAP 12/23/2015  . Urinary incontinence 03/31/2014  . Fibrocystic breast disease 06/06/2011  . Routine health maintenance 11/12/2010  . Obesity 09/11/2007  . Hyperlipidemia 06/10/2007    Sumner Boast., PT 10/08/2018, 4:15 PM  [address] [place], [place], [address] Phone: [phone]   Fax:  [phone]  Name: Kelly [name] MRN: [id number] Date of Birth: 1940-08-24

## 2018-10-10 ENCOUNTER — Other Ambulatory Visit: Payer: Self-pay

## 2018-10-10 ENCOUNTER — Ambulatory Visit: Payer: Medicare Other | Admitting: Physical Therapy

## 2018-10-10 ENCOUNTER — Encounter: Payer: Self-pay | Admitting: Physical Therapy

## 2018-10-10 DIAGNOSIS — M25612 Stiffness of left shoulder, not elsewhere classified: Secondary | ICD-10-CM

## 2018-10-10 DIAGNOSIS — M5441 Lumbago with sciatica, right side: Secondary | ICD-10-CM | POA: Diagnosis not present

## 2018-10-10 DIAGNOSIS — M25512 Pain in left shoulder: Secondary | ICD-10-CM

## 2018-10-10 DIAGNOSIS — R252 Cramp and spasm: Secondary | ICD-10-CM

## 2018-10-10 NOTE — Therapy (Signed)
[place] [address] [place], [place], [address] Phone: [phone]   Fax:  [phone]  Physical Therapy Treatment  Patient Details  Name: Kelly [name] MRN: [id number] Date of Birth: 1940-05-29 Referring Provider (PT): Creig Hines   Encounter Date: 10/10/2018  PT End of Session - 10/10/18 1650    Visit Number  11    Date for PT Re-Evaluation  11/25/18    PT Start Time  1445    PT Stop Time  1533    PT Time Calculation (min)  48 min    Activity Tolerance  Patient tolerated treatment well    Behavior During Therapy  [facility] for tasks assessed/performed       Past Medical History:  Diagnosis Date  . Allergy   . Blood transfusion without reported diagnosis   . Breast mass    fibocystic breast dx  . Hepatitis    CMV 1992  . Hyperlipidemia   . Postmenopausal HRT (hormone replacement therapy)   . Scarlet fever as a teen    Past Surgical History:  Procedure Laterality Date  . APPENDECTOMY    . BUNIONECTOMY    . caesarean section    . COLONOSCOPY    . CORRECTION HAMMER TOE    . FOOT TENDON SURGERY     left   . POLYPECTOMY    . TONSILLECTOMY      There were no vitals filed for this visit.  Subjective Assessment - 10/10/18 1456    Subjective  Patient reports that she tried to go for a walk this AM and reallly started hurting in the right hip and the ischial are with weight bearing, she is non tender on the ischial bone but very tender on the GT on the right    Currently in Pain?  Yes    Pain Score  6     Pain Location  Hip    Pain Orientation  Right;Posterior;Lateral    Aggravating Factors   weight bearing                       [facility] Adult PT Treatment/Exercise - 10/10/18 0001      Shoulder Exercises: Supine   Other Supine Exercises  feet on ball K2C, small trunk rotation, small bridge activation and then abdominal activation      Shoulder Exercises: Sidelying   Other Sidelying  Exercises  clams 20      Shoulder Exercises: ROM/Strengthening   Nustep  level 4 x 5 minutes      Modalities   Modalities  Electrical Stimulation;Moist Heat;Iontophoresis      Moist Heat Therapy   Number Minutes Moist Heat  15 Minutes    Moist Heat Location  Hip      Electrical Stimulation   Electrical Stimulation Location  right hip area    Electrical Stimulation Action  IFC    Electrical Stimulation Parameters  supine    Electrical Stimulation Goals  Pain      Iontophoresis   Type of Iontophoresis  Dexamethasone    Location  right GT area    Dose  65m    Time  4 hour patch      Manual Therapy   Manual Therapy  Soft tissue mobilization;Passive ROM    Soft tissue mobilization  STM to the right buttock, right low back and into the right ITB    Passive ROM  passive stretch to the HS, ITB and  piriformis on the right               PT Short Term Goals - 07/16/18 0836      PT SHORT TERM GOAL #1   Title  independent with initial HEP    Status  Achieved        PT Long Term Goals - 10/10/18 1652      PT LONG TERM GOAL #1   Title  decrease pain 50%    Status  Partially Met      PT LONG TERM GOAL #5   Title  drive without pain    Status  Achieved            Plan - 10/10/18 1651    Clinical Impression Statement  Patient report sthat she tried to go for a walk for exercise this morning and was having shooting pain in the right LE with weight bearing.  She is very tender in the right GT area, the right piriformis.  pressure on the piriformis would replicate her leg pain.    PT Next Visit Plan  see if todays treatment helped more    Consulted and Agree with Plan of Care  Patient       Patient will benefit from skilled therapeutic intervention in order to improve the following deficits and impairments:  Pain, Improper body mechanics, Increased muscle spasms, Postural dysfunction, Impaired UE functional use, Decreased strength, Impaired flexibility, Decreased  range of motion  Visit Diagnosis: Acute pain of left shoulder  Cramp and spasm  Stiffness of left shoulder, not elsewhere classified  Acute right-sided low back pain with right-sided sciatica     Problem List Patient Active Problem List   Diagnosis Date Noted  . 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 Neck pain with neck stiffness after whiplash injury to neck 04/11/2017  . Lumbar radiculopathy 04/11/2017  . Whiplash 02/22/2017  . 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 OSA on CPAP 12/23/2015  . Urinary incontinence 03/31/2014  . Fibrocystic breast disease 06/06/2011  . Routine health maintenance 11/12/2010  . Obesity 09/11/2007  . Hyperlipidemia 06/10/2007    Sumner Boast., PT 10/10/2018, 4:53 PM  [address] [place], [place], [address] Phone: [phone]   Fax:  [phone]  Name: Britiny Defrain MRN: [id number] Date of Birth: [birth year]

## 2018-10-15 ENCOUNTER — Other Ambulatory Visit: Payer: Self-pay

## 2018-10-15 ENCOUNTER — Ambulatory Visit: Payer: Medicare Other | Attending: Family Medicine | Admitting: Physical Therapy

## 2018-10-15 ENCOUNTER — Encounter: Payer: Self-pay | Admitting: Physical Therapy

## 2018-10-15 DIAGNOSIS — M542 Cervicalgia: Secondary | ICD-10-CM | POA: Insufficient documentation

## 2018-10-15 DIAGNOSIS — M25612 Stiffness of left shoulder, not elsewhere classified: Secondary | ICD-10-CM | POA: Diagnosis not present

## 2018-10-15 DIAGNOSIS — M25512 Pain in left shoulder: Secondary | ICD-10-CM | POA: Diagnosis not present

## 2018-10-15 DIAGNOSIS — M5441 Lumbago with sciatica, right side: Secondary | ICD-10-CM

## 2018-10-15 DIAGNOSIS — R252 Cramp and spasm: Secondary | ICD-10-CM | POA: Diagnosis not present

## 2018-10-15 NOTE — Therapy (Signed)
[place] [address] [place], [place], [address] Phone: [phone]   Fax:  [phone]  Physical Therapy Treatment  Patient Details  Name: Trannie Bardales MRN: [id number] Date of Birth: [birth year] Referring Provider (PT): Creig Hines   Encounter Date: 10/15/2018  PT End of Session - 10/15/18 1518    Visit Number  12    Date for PT Re-Evaluation  11/25/18    PT Start Time  1443    PT Stop Time  1531    PT Time Calculation (min)  48 min    Activity Tolerance  Patient tolerated treatment well    Behavior During Therapy  [facility] for tasks assessed/performed       Past Medical History:  Diagnosis Date  . Allergy   . Blood transfusion without reported diagnosis   . Breast mass    fibocystic breast dx  . Hepatitis    CMV 1992  . Hyperlipidemia   . Postmenopausal HRT (hormone replacement therapy)   . Scarlet fever as a teen    Past Surgical History:  Procedure Laterality Date  . APPENDECTOMY    . BUNIONECTOMY    . caesarean section    . COLONOSCOPY    . CORRECTION HAMMER TOE    . FOOT TENDON SURGERY     left   . POLYPECTOMY    . TONSILLECTOMY      There were no vitals filed for this visit.  Subjective Assessment - 10/15/18 1516    Subjective  Patient reports that she felt much better, less pain and less pain in the leg after the last treatment    Currently in Pain?  Yes    Pain Score  3     Pain Location  Hip    Pain Orientation  Right;Posterior    Pain Relieving Factors  treatment seems to help                       [facility] Adult PT Treatment/Exercise - 10/15/18 0001      Moist Heat Therapy   Number Minutes Moist Heat  15 Minutes    Moist Heat Location  Hip      Electrical Stimulation   Electrical Stimulation Location  right hip area    Electrical Stimulation Action  IFC    Electrical Stimulation Parameters  supine    Electrical Stimulation Goals  Pain      Manual Therapy   Manual Therapy  Soft tissue mobilization;Passive ROM    Manual therapy comments  did some gentle contract relax stretching    Soft tissue mobilization  STM to the right buttock, right low back and into the right ITB    Passive ROM  passive stretch to the HS, ITB and piriformis on the right               PT Short Term Goals - 07/16/18 0836      PT SHORT TERM GOAL #1   Title  independent with initial HEP    Status  Achieved        PT Long Term Goals - 10/10/18 1652      PT LONG TERM GOAL #1   Title  decrease pain 50%    Status  Partially Met      PT LONG TERM GOAL #5   Title  drive without pain    Status  Achieved  Plan - 10/15/18 1518    Clinical Impression Statement  Patient had great results from the last treatment with less pain and less issues with the leg and walking, she is very tight in the right LE, has knots and trigger points in the origin of the gluteal mms, very tender in the right ITB    PT Next Visit Plan  we will resume exercises next visit    Consulted and Agree with Plan of Care  Patient       Patient will benefit from skilled therapeutic intervention in order to improve the following deficits and impairments:  Pain, Improper body mechanics, Increased muscle spasms, Postural dysfunction, Impaired UE functional use, Decreased strength, Impaired flexibility, Decreased range of motion  Visit Diagnosis: Acute pain of left shoulder  Acute right-sided low back pain with right-sided sciatica  Cramp and spasm  Stiffness of left shoulder, not elsewhere classified     Problem List Patient Active Problem List   Diagnosis Date Noted  . 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 Neck pain with neck stiffness after whiplash injury to neck 04/11/2017  . Lumbar radiculopathy 04/11/2017  . Whiplash 02/22/2017  . 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 OSA on CPAP 12/23/2015  . Urinary incontinence  03/31/2014  . Fibrocystic breast disease 06/06/2011  . Routine health maintenance 11/12/2010  . Obesity 09/11/2007  . Hyperlipidemia 06/10/2007    Sumner Boast., PT 10/15/2018, 3:20 PM  [place] [address] [place], [place], [address] Phone: [phone]   Fax:  [phone]  Name: Nanea Jared MRN: [id number] Date of Birth: [birth year]

## 2018-10-17 ENCOUNTER — Ambulatory Visit: Payer: Medicare Other | Admitting: Physical Therapy

## 2018-10-17 ENCOUNTER — Encounter: Payer: Self-pay | Admitting: Physical Therapy

## 2018-10-17 ENCOUNTER — Other Ambulatory Visit: Payer: Self-pay

## 2018-10-17 DIAGNOSIS — R252 Cramp and spasm: Secondary | ICD-10-CM

## 2018-10-17 DIAGNOSIS — M5441 Lumbago with sciatica, right side: Secondary | ICD-10-CM

## 2018-10-17 DIAGNOSIS — M25512 Pain in left shoulder: Secondary | ICD-10-CM

## 2018-10-17 DIAGNOSIS — M542 Cervicalgia: Secondary | ICD-10-CM | POA: Diagnosis not present

## 2018-10-17 DIAGNOSIS — M25612 Stiffness of left shoulder, not elsewhere classified: Secondary | ICD-10-CM | POA: Diagnosis not present

## 2018-10-17 NOTE — Therapy (Signed)
[place] [address] [place], [place], [address] Phone: [phone]   Fax:  [phone]  Physical Therapy Treatment  Patient Details  Name: Kelly [name] MRN: [id number] Date of Birth: [birth year] Referring Provider (PT): Creig Hines   Encounter Date: 10/17/2018  PT End of Session - 10/17/18 1521    Visit Number  13    Date for PT Re-Evaluation  11/25/18    PT Start Time  1444    PT Stop Time  1534    PT Time Calculation (min)  50 min    Activity Tolerance  Patient tolerated treatment well    Behavior During Therapy  [facility] for tasks assessed/performed       Past Medical History:  Diagnosis Date  . Allergy   . Blood transfusion without reported diagnosis   . Breast mass    fibocystic breast dx  . Hepatitis    CMV 1992  . Hyperlipidemia   . Postmenopausal HRT (hormone replacement therapy)   . Scarlet fever as a teen    Past Surgical History:  Procedure Laterality Date  . APPENDECTOMY    . BUNIONECTOMY    . caesarean section    . COLONOSCOPY    . CORRECTION HAMMER TOE    . FOOT TENDON SURGERY     left   . POLYPECTOMY    . TONSILLECTOMY      There were no vitals filed for this visit.  Subjective Assessment - 10/17/18 1447    Subjective  Patient comes in today and is walking better with better posture and faster, reports that she is feeling better    Currently in Pain?  Yes    Pain Score  3     Pain Location  Hip    Pain Orientation  Right;Posterior                       [facility] Adult PT Treatment/Exercise - 10/17/18 0001      Shoulder Exercises: Supine   Other Supine Exercises  Bridges with ball squeeze and bridges with tband for ahip abduction, passive stretches of the HS and piriformis mms    Other Supine Exercises  feet on ball K2C, small trunk rotation, small bridge activation and then abdominal activation      Shoulder Exercises: ROM/Strengthening   Nustep  level 4 x 5 minutes       Moist Heat Therapy   Number Minutes Moist Heat  15 Minutes    Moist Heat Location  Hip      Electrical Stimulation   Electrical Stimulation Location  right hip area    Electrical Stimulation Action  IFC    Electrical Stimulation Parameters  supine    Electrical Stimulation Goals  Pain      Manual Therapy   Manual Therapy  Soft tissue mobilization;Passive ROM    Soft tissue mobilization  STM to the right buttock, right low back and into the right ITB    Passive ROM  passive stretch to the HS, ITB and piriformis on the right               PT Short Term Goals - 07/16/18 0836      PT SHORT TERM GOAL #1   Title  independent with initial HEP    Status  Achieved        PT Long Term Goals - 10/17/18 1522      PT LONG TERM GOAL #  1   Title  decrease pain 50%    Status  Partially Met            Plan - 10/17/18 1521    Clinical Impression Statement  Patient moving better, faster and with less limp and better posture today.  We tried to resume some exercise without much diffiuclty, she has pain when lifting the right leg up for stairs or to get in a car.  She is very tender in the right piriformis and ITB areas    PT Next Visit Plan  see if we can get back to strength and function with less pain    Consulted and Agree with Plan of Care  Patient       Patient will benefit from skilled therapeutic intervention in order to improve the following deficits and impairments:  Pain, Improper body mechanics, Increased muscle spasms, Postural dysfunction, Impaired UE functional use, Decreased strength, Impaired flexibility, Decreased range of motion  Visit Diagnosis: Acute pain of left shoulder  Acute right-sided low back pain with right-sided sciatica  Cramp and spasm  Stiffness of left shoulder, not elsewhere classified     Problem List Patient Active Problem List   Diagnosis Date Noted  . 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  Neck pain with neck stiffness after whiplash injury to neck 04/11/2017  . Lumbar radiculopathy 04/11/2017  . Whiplash 02/22/2017  . 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 OSA on CPAP 12/23/2015  . Urinary incontinence 03/31/2014  . Fibrocystic breast disease 06/06/2011  . Routine health maintenance 11/12/2010  . Obesity 09/11/2007  . Hyperlipidemia 06/10/2007    Sumner Boast., PT 10/17/2018, 3:23 PM  [place] [address] [place], [place], [address] Phone: [phone]   Fax:  [phone]  Name: Ebonee Stober MRN: [id number] Date of Birth: 06-01-1940

## 2018-10-21 ENCOUNTER — Ambulatory Visit: Payer: Medicare Other | Admitting: Family Medicine

## 2018-10-22 ENCOUNTER — Other Ambulatory Visit: Payer: Self-pay

## 2018-10-22 ENCOUNTER — Ambulatory Visit: Payer: Medicare Other | Admitting: Physical Therapy

## 2018-10-22 ENCOUNTER — Encounter: Payer: Self-pay | Admitting: Physical Therapy

## 2018-10-22 DIAGNOSIS — M25612 Stiffness of left shoulder, not elsewhere classified: Secondary | ICD-10-CM

## 2018-10-22 DIAGNOSIS — R252 Cramp and spasm: Secondary | ICD-10-CM

## 2018-10-22 DIAGNOSIS — M25512 Pain in left shoulder: Secondary | ICD-10-CM | POA: Diagnosis not present

## 2018-10-22 DIAGNOSIS — M5441 Lumbago with sciatica, right side: Secondary | ICD-10-CM

## 2018-10-22 DIAGNOSIS — M542 Cervicalgia: Secondary | ICD-10-CM | POA: Diagnosis not present
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
## 2018-10-23 ENCOUNTER — Other Ambulatory Visit: Payer: Self-pay

## 2018-10-23 ENCOUNTER — Ambulatory Visit
Admission: RE | Admit: 2018-10-23 | Discharge: 2018-10-23 | Disposition: A | Discharge status: Home / Self Care | Payer: Medicare Other | Source: Ambulatory Visit | Attending: Family Medicine | Admitting: Family Medicine

## 2018-10-23 DIAGNOSIS — M50222 Other cervical disc displacement at C5-C6 level: Secondary | ICD-10-CM | POA: Diagnosis not present

## 2018-10-23 DIAGNOSIS — M5412 Radiculopathy, cervical region: Secondary | ICD-10-CM

## 2018-10-23 MED ORDER — IOPAMIDOL (ISOVUE-M 200) INJECTION 41%: 1.0000 mL | Freq: Once | INTRAMUSCULAR | Status: DC

## 2018-10-23 MED ORDER — IOPAMIDOL (ISOVUE-M 300) INJECTION 61%
1.0000 mL | Freq: Once | INTRAMUSCULAR | Status: AC
  Administered 2018-10-23: 13:00:00 1 mL via EPIDURAL

## 2018-10-23 MED ORDER — TRIAMCINOLONE ACETONIDE 40 MG/ML IJ SUSP (RADIOLOGY)
60.0000 mg | Freq: Once | INTRAMUSCULAR | Status: AC
  Administered 2018-10-23: 13:00:00 60 mg via EPIDURAL

## 2018-10-23 MED ORDER — METHYLPREDNISOLONE ACETATE 40 MG/ML INJ SUSP (RADIOLOG: 120.0000 mg | Freq: Once | INTRAMUSCULAR | Status: DC

## 2018-10-23 NOTE — Discharge Instructions (Signed)

## 2018-10-28 ENCOUNTER — Encounter: Payer: Self-pay | Admitting: Physical Therapy

## 2018-10-28 ENCOUNTER — Ambulatory Visit: Payer: Medicare Other | Admitting: Physical Therapy

## 2018-10-28 ENCOUNTER — Other Ambulatory Visit: Payer: Self-pay

## 2018-10-28 DIAGNOSIS — M25612 Stiffness of left shoulder, not elsewhere classified: Secondary | ICD-10-CM

## 2018-10-28 DIAGNOSIS — R252 Cramp and spasm: Secondary | ICD-10-CM | POA: Diagnosis not present

## 2018-10-28 DIAGNOSIS — M5441 Lumbago with sciatica, right side: Secondary | ICD-10-CM | POA: Diagnosis not present

## 2018-10-28 DIAGNOSIS — M542 Cervicalgia: Secondary | ICD-10-CM | POA: Diagnosis not present

## 2018-10-28 DIAGNOSIS — M25512 Pain in left shoulder: Secondary | ICD-10-CM
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
## 2018-10-31 ENCOUNTER — Other Ambulatory Visit: Payer: Self-pay

## 2018-10-31 ENCOUNTER — Ambulatory Visit: Payer: Medicare Other | Admitting: Physical Therapy

## 2018-10-31 ENCOUNTER — Encounter: Payer: Self-pay | Admitting: Physical Therapy

## 2018-10-31 DIAGNOSIS — M5441 Lumbago with sciatica, right side: Secondary | ICD-10-CM

## 2018-10-31 DIAGNOSIS — R252 Cramp and spasm: Secondary | ICD-10-CM

## 2018-10-31 DIAGNOSIS — M25612 Stiffness of left shoulder, not elsewhere classified: Secondary | ICD-10-CM | POA: Diagnosis not present

## 2018-10-31 DIAGNOSIS — M542 Cervicalgia: Secondary | ICD-10-CM | POA: Diagnosis not present

## 2018-10-31 DIAGNOSIS — M25512 Pain in left shoulder: Secondary | ICD-10-CM | POA: Diagnosis not present
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
## 2018-11-06 ENCOUNTER — Other Ambulatory Visit: Payer: Self-pay

## 2018-11-06 ENCOUNTER — Encounter: Payer: Self-pay | Admitting: Physical Therapy

## 2018-11-06 ENCOUNTER — Ambulatory Visit: Payer: Medicare Other | Admitting: Physical Therapy

## 2018-11-06 DIAGNOSIS — M5441 Lumbago with sciatica, right side: Secondary | ICD-10-CM

## 2018-11-06 DIAGNOSIS — M542 Cervicalgia: Secondary | ICD-10-CM

## 2018-11-06 DIAGNOSIS — R252 Cramp and spasm: Secondary | ICD-10-CM | POA: Diagnosis not present

## 2018-11-06 DIAGNOSIS — M25612 Stiffness of left shoulder, not elsewhere classified: Secondary | ICD-10-CM

## 2018-11-06 DIAGNOSIS — M25512 Pain in left shoulder: Secondary | ICD-10-CM | POA: Diagnosis not present
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
## 2018-11-13 ENCOUNTER — Encounter: Payer: Self-pay | Admitting: Family Medicine

## 2018-11-13 ENCOUNTER — Ambulatory Visit (INDEPENDENT_AMBULATORY_CARE_PROVIDER_SITE_OTHER): Payer: Medicare Other | Admitting: Family Medicine

## 2018-11-13 DIAGNOSIS — G8929 Other chronic pain: Secondary | ICD-10-CM | POA: Diagnosis not present

## 2018-11-13 DIAGNOSIS — S134XXA Sprain of ligaments of cervical spine, initial encounter: Secondary | ICD-10-CM | POA: Diagnosis not present

## 2018-11-13 DIAGNOSIS — M25512 Pain in left shoulder: Secondary | ICD-10-CM

## 2018-11-13 DIAGNOSIS — S134XXD Sprain of ligaments of cervical spine, subsequent encounter: Secondary | ICD-10-CM
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
## 2018-11-14 DIAGNOSIS — G4733 Obstructive sleep apnea (adult) (pediatric): Secondary | ICD-10-CM | POA: Diagnosis not present

## 2018-12-04 ENCOUNTER — Ambulatory Visit (INDEPENDENT_AMBULATORY_CARE_PROVIDER_SITE_OTHER): Payer: Medicare Other | Admitting: Family Medicine

## 2018-12-04 ENCOUNTER — Ambulatory Visit: Payer: Self-pay

## 2018-12-04 ENCOUNTER — Encounter: Payer: Self-pay | Admitting: Family Medicine

## 2018-12-04 ENCOUNTER — Other Ambulatory Visit: Payer: Self-pay

## 2018-12-04 VITALS — BP 110/60 | HR 80 | Ht 67.0 in | Wt 205.0 lb

## 2018-12-04 DIAGNOSIS — G8929 Other chronic pain: Secondary | ICD-10-CM

## 2018-12-04 DIAGNOSIS — S134XXD Sprain of ligaments of cervical spine, subsequent encounter: Secondary | ICD-10-CM | POA: Diagnosis not present

## 2018-12-04 DIAGNOSIS — M25512 Pain in left shoulder: Secondary | ICD-10-CM | POA: Diagnosis not present

## 2018-12-04 MED ORDER — GABAPENTIN 100 MG PO CAPS: 200.0000 mg | ORAL_CAPSULE | Freq: Every day | ORAL | 3 refills | Status: DC

## 2018-12-04 NOTE — Assessment & Plan Note (Addendum)
Patient has made significant strides at this time.  Patient did have more of a bursitis of the shoulder but do not think any injection is necessary.  Patient can finish with physical therapy at this time.  I do believe the patient is near maximal medical improvement but with the underlying arthritic changes of the neck there is a chance for intermittent exacerbations that may need medical care.  Patient is in agreement with the plan at the moment and can follow-up as needed spent  25 minutes with patient face-to-face and had greater than 50% of counseling including as described above in assessment and plan.
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
## 2018-12-04 NOTE — Patient Instructions (Addendum)
Gabapentin 200 mg at night  See [place] again in 2 months

## 2018-12-09 ENCOUNTER — Encounter: Payer: Self-pay | Admitting: Family Medicine

## 2018-12-19 ENCOUNTER — Encounter: Payer: Self-pay | Admitting: Family Medicine

## 2019-01-29 DIAGNOSIS — G4733 Obstructive sleep apnea (adult) (pediatric): Secondary | ICD-10-CM | POA: Diagnosis not present

## 2019-02-04 ENCOUNTER — Ambulatory Visit: Payer: Self-pay

## 2019-02-04 ENCOUNTER — Other Ambulatory Visit: Payer: Self-pay

## 2019-02-04 ENCOUNTER — Ambulatory Visit (INDEPENDENT_AMBULATORY_CARE_PROVIDER_SITE_OTHER): Payer: Medicare Other | Admitting: Family Medicine

## 2019-02-04 VITALS — BP 122/82 | HR 83 | Ht 67.0 in | Wt 205.0 lb

## 2019-02-04 DIAGNOSIS — M7061 Trochanteric bursitis, right hip: Secondary | ICD-10-CM | POA: Diagnosis not present

## 2019-02-04 DIAGNOSIS — G8929 Other chronic pain: Secondary | ICD-10-CM | POA: Diagnosis not present

## 2019-02-04 DIAGNOSIS — M25512 Pain in left shoulder: Secondary | ICD-10-CM

## 2019-02-04 DIAGNOSIS — M7552 Bursitis of left shoulder: Secondary | ICD-10-CM

## 2019-02-04 DIAGNOSIS — M7551 Bursitis of right shoulder: Secondary | ICD-10-CM | POA: Diagnosis not present

## 2019-02-04 NOTE — Patient Instructions (Signed)
Injected both shoulder and right hip If hip is not better, we might need to consider back injection Otherwise see me in 6 weeks
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
## 2019-02-05 ENCOUNTER — Encounter: Payer: Self-pay | Admitting: Family Medicine

## 2019-02-05 DIAGNOSIS — M7061 Trochanteric bursitis, right hip: Secondary | ICD-10-CM | POA: Insufficient documentation
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
## 2019-02-14 DIAGNOSIS — Z1231 Encounter for screening mammogram for malignant neoplasm of breast: Secondary | ICD-10-CM | POA: Diagnosis not present

## 2019-02-14 LAB — HM MAMMOGRAPHY

## 2019-02-18 ENCOUNTER — Encounter: Payer: Self-pay | Admitting: Internal Medicine

## 2019-02-18 NOTE — Progress Notes (Signed)
Abstracted and sent to scan  

## 2019-02-21 DIAGNOSIS — Z23 Encounter for immunization: Secondary | ICD-10-CM | POA: Diagnosis not present

## 2019-03-18 ENCOUNTER — Encounter: Payer: Self-pay | Admitting: Family Medicine

## 2019-03-18 ENCOUNTER — Ambulatory Visit (INDEPENDENT_AMBULATORY_CARE_PROVIDER_SITE_OTHER): Payer: Medicare Other | Admitting: Family Medicine

## 2019-03-18 ENCOUNTER — Other Ambulatory Visit: Payer: Self-pay

## 2019-03-18 DIAGNOSIS — G8929 Other chronic pain: Secondary | ICD-10-CM | POA: Diagnosis not present

## 2019-03-18 DIAGNOSIS — M25512 Pain in left shoulder: Secondary | ICD-10-CM | POA: Diagnosis not present

## 2019-03-18 NOTE — Progress Notes (Signed)
[name] [address] [place], [place] [address] Phone: [phone] Subjective:   [name], am serving as a scribe for Dr. Hulan Saas.  I'm seeing this patient by the request  of:    CC: Left shoulder pain  [id number]   02/04/2019 Continues to have pain at this time.  I believe that this is more of a reactive bursitis.  Feels that the rotator cuff is fully healed at this time.  I do think patient needed injections.  There is still likely an exacerbation and this was a flare potentially from the accident but at this point I do believe patient should have maximal medical improvement with 0% disability.  Patient will follow-up again with me though to further evaluate in 6 to 8 weeks to make sure patient is doing well  Injection given today.  Tolerated the procedure well.  Discussed posture and ergonomics.  Patient is to increase activity as tolerated.  This could be more secondary to how patient has been favoring her back that could have been an injury from the accident.  Once again though I believe that this will have no long-term effects encourage patient to continue to increase activity  Update 03/18/2019 Cortne Kowalke is a 78 y.o. female coming in with complaint of left shoulder pain. Patient feels like her posture is improving. Is seeing a personal trainer throughout the week. Finally felt relief from injections 3 weeks later.   Is still having tightness in her hip with stairs but feels like in general the hip is not as tight as it once was.   Discuss keeping hands in peripheral vision throughout he workouts with personal trainer. Patient would like to try to navigate 3 flights of stairs at her apartment. Recommended for patient to try navigating 3-5 steps near her apartment floor and see how her hip feels and how she feels from a cardiovascular standpoint before navigating an entire flight and working into walking up all 3 flights of stairs.    Past Medical History:  Diagnosis Date  . Allergy   . Blood transfusion without reported diagnosis   . Breast mass    fibocystic breast dx  . Hepatitis    CMV 1992  . Hyperlipidemia   . Postmenopausal HRT (hormone replacement therapy)   . Scarlet fever as a teen   Past Surgical History:  Procedure Laterality Date  . APPENDECTOMY    . BUNIONECTOMY    . caesarean section    . COLONOSCOPY    . CORRECTION HAMMER TOE    . FOOT TENDON SURGERY     left   . POLYPECTOMY    . TONSILLECTOMY     Social History   Socioeconomic History  . Marital status: Single    Spouse name: Not on file  . Number of children: 2  . Years of education: 39  . Highest education level: Not on file  Occupational History  . Occupation: MORTGAGE Surveyor, mining: [employer]    Comment: webinars on Hotel manager: [employer]  . Financial resource strain: Not hard at all  . Food insecurity    Worry: Never true    Inability: Never true  . Transportation needs    Medical: No    Non-medical: No  Tobacco Use  . Smoking status: Never Smoker  . Smokeless tobacco: Never Used  Substance and Sexual Activity  . Alcohol use: Yes  Comment: rarely will have a glass of wine  . Drug use: No  . Sexual activity: Yes    Partners: Male  Lifestyle  . Physical activity    Days per week: 3 days    Minutes per session: 50 min  . Stress: Not at all  Relationships  . Social connections    Talks on phone: More than three times a week    Gets together: More than three times a week    Attends religious service: More than 4 times per year    Active member of club or organization: Yes    Attends meetings of clubs or organizations: More than 4 times per year    Relationship status: Not on file  Other Topics Concern  . 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 Lives alone and independently. Married '67--24  yrs. - widowed '92.  2 dtrs - '69, '73; 6 grandchildren. ACP/EoL - yes CPR, yes for short term ventilation, no prolonged heroic care in an irreversible state.    Allergies  Allergen Reactions  . Amoxicillin Rash  . Codeine Nausea Only   Family History  Problem Relation Age of Onset  . Pancreatic cancer Mother   . Hyperlipidemia Mother   . Hypertension Mother   . Polymyalgia rheumatica Mother   . Dementia Father     Current Outpatient Medications (Endocrine & Metabolic):  .  estradiol (CLIMARA - DOSED IN MG/24 HR) 0.05 mg/24hr patch, 1/2 patch placed on the skin weekly .  progesterone (PROMETRIUM) 200 MG capsule, Take 1 capsule (200 mg total) by mouth as directed. 12 days out of every 6 months. (Patient taking differently: Take 200 mg by mouth as directed. 2 months 14 days then skip a month)  Current Outpatient Medications (Cardiovascular):  .  atorvastatin (LIPITOR) 40 MG tablet, TAKE 1 TABLET BY MOUTH  DAILY     Current Outpatient Medications (Other):  .  estradiol (ESTRACE) 0.1 MG/GM vaginal cream, Place [id number] Applicatorfuls vaginally every Wednesday and Saturday. .  gabapentin (NEURONTIN) 100 MG capsule, Take 2 capsules (200 mg total) by mouth at bedtime. .  multivitamin (THERAGRAN) per tablet, Take 1 tablet by mouth daily.   Marland Kitchen  PREMARIN vaginal cream, Place 1 Applicatorful vaginally 2 (two) times a week.     Past medical history, social, surgical and family history all reviewed in electronic medical record.  No pertanent information unless stated regarding to the chief complaint.   Review of Systems:  No headache, visual changes, nausea, vomiting, diarrhea, constipation, dizziness, abdominal pain, skin rash, fevers, chills, night sweats, weight loss, swollen lymph nodes, body aches, joint swelling, chest pain, shortness of breath, mood changes.  Positive muscle aches  Objective  Blood pressure 122/86, pulse 74, height 5\' 7"  (1.702 m), weight 206 lb (93.4 kg), SpO2 95 %.     General: No apparent distress alert and oriented x3 mood and affect normal, dressed appropriately.  HEENT: Pupils equal, extraocular movements intact  Respiratory: Patient's speak in full sentences and does not appear short of breath  Cardiovascular: No lower extremity edema, non tender, no erythema  Skin: Warm dry intact with no signs of infection or rash on extremities or on axial skeleton.  Abdomen: Soft nontender  Neuro: Cranial nerves II through XII are intact, neurovascularly intact in all extremities with 2+ DTRs and 2+ pulses.  Lymph: No lymphadenopathy of posterior or anterior cervical chain or axillae bilaterally.  Gait normal with good balance and  coordination.  MSK:  tender with full range of motion and good stability and symmetric strength and tone of  elbows, wrist, hip, knee and ankles bilaterally.  Mild arthritic changes of multiple joints Left shoulder exam shows the patient does have positive impingement.  Rotator cuff strength 4+ out of 5 on the left compared to the right.  Negative Spurling's noted today.  Patient does have some crepitus with range of motion of the neck.  5-5 strength of the upper extremities in the hands with grip strength   Impression and Recommendations:      The above documentation has been reviewed and is accurate and complete Lyndal Pulley, DO       Note: This dictation was prepared with Dragon dictation along with smaller phrase technology. Any transcriptional errors that result from this process are unintentional.

## 2019-03-18 NOTE — Assessment & Plan Note (Signed)
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  We discussed with patient icing regimen, home exercises, patient would not want any surgical intervention.  Encourage her to continue to work on the exercises.  Follow-up with me again as needed

## 2019-03-18 NOTE — Patient Instructions (Signed)
No lifting no more than 15 lbs Keep hands within prephreal vision Keep working on hip range of motions See me again in 2 months

## 2019-04-01 DIAGNOSIS — G4733 Obstructive sleep apnea (adult) (pediatric): Secondary | ICD-10-CM | POA: Diagnosis not present

## 2019-04-17 DIAGNOSIS — Z780 Asymptomatic menopausal state: Secondary | ICD-10-CM | POA: Diagnosis not present

## 2019-08-07 ENCOUNTER — Other Ambulatory Visit: Payer: Self-pay

## 2019-08-07 ENCOUNTER — Encounter: Payer: Self-pay | Admitting: Family Medicine

## 2019-08-07 ENCOUNTER — Ambulatory Visit (INDEPENDENT_AMBULATORY_CARE_PROVIDER_SITE_OTHER): Payer: Medicare HMO | Admitting: Family Medicine

## 2019-08-07 VITALS — BP 110/82 | HR 87 | Ht 67.0 in | Wt 211.0 lb

## 2019-08-07 DIAGNOSIS — M2550 Pain in unspecified joint: Secondary | ICD-10-CM

## 2019-08-07 DIAGNOSIS — M5030 Other cervical disc degeneration, unspecified cervical region: Secondary | ICD-10-CM

## 2019-08-07 DIAGNOSIS — M5416 Radiculopathy, lumbar region: Secondary | ICD-10-CM

## 2019-08-07 HISTORY — DX: Other cervical disc degeneration, unspecified cervical region: M50.30

## 2019-08-07 LAB — COMPREHENSIVE METABOLIC PANEL
ALT: 15 U/L (ref 0–35)
AST: 17 U/L (ref 0–37)
Albumin: 4.3 g/dL (ref 3.5–5.2)
Alkaline Phosphatase: 56 U/L (ref 39–117)
BUN: 23 mg/dL (ref 6–23)
CO2: 27 mEq/L (ref 19–32)
Calcium: 9.9 mg/dL (ref 8.4–10.5)
Chloride: 104 mEq/L (ref 96–112)
Creatinine, Ser: 0.89 mg/dL (ref 0.40–1.20)
GFR: 61.52 mL/min (ref >60.00)
Glucose, Bld: 92 mg/dL (ref 70–99)
Potassium: 4.3 mEq/L (ref 3.5–5.1)
Sodium: 139 mEq/L (ref 135–145)
Total Bilirubin: 0.6 mg/dL (ref 0.2–1.2)
Total Protein: 7.4 g/dL (ref 6.0–8.3)

## 2019-08-07 LAB — CBC WITH DIFFERENTIAL/PLATELET
Basophils Absolute: 0 10*3/uL (ref 0.0–0.1)
Basophils Relative: 0.6 % (ref 0.0–3.0)
Eosinophils Absolute: 0.1 10*3/uL (ref 0.0–0.7)
Eosinophils Relative: 2 % (ref 0.0–5.0)
HCT: 39.9 % (ref 36.0–46.0)
Hemoglobin: 13.6 g/dL (ref 12.0–15.0)
Lymphocytes Relative: 31.2 % (ref 12.0–46.0)
Lymphs Abs: 2.1 10*3/uL (ref 0.7–4.0)
MCHC: 34.1 g/dL (ref 30.0–36.0)
MCV: 88.2 fl (ref 78.0–100.0)
Monocytes Absolute: 0.5 10*3/uL (ref 0.1–1.0)
Monocytes Relative: 7.5 % (ref 3.0–12.0)
Neutro Abs: 3.9 10*3/uL (ref 1.4–7.7)
Neutrophils Relative %: 58.7 % (ref 43.0–77.0)
Platelets: 381 10*3/uL (ref 150.0–400.0)
RBC: 4.53 Mil/uL (ref 3.87–5.11)
RDW: 12.8 % (ref 11.5–15.5)
WBC: 6.6 10*3/uL (ref 4.0–10.5)

## 2019-08-07 LAB — SEDIMENTATION RATE: Sed Rate: 30 mm/hr (ref 0–30)

## 2019-08-07 LAB — URIC ACID: Uric Acid, Serum: 6.6 mg/dL (ref 2.4–7.0)

## 2019-08-07 LAB — TSH: TSH: 0.9 u[IU]/mL (ref 0.35–4.50)

## 2019-08-07 LAB — C-REACTIVE PROTEIN: CRP: <1 mg/dL (ref 0.5–20.0)

## 2019-08-07 LAB — FERRITIN: Ferritin: 62 ng/mL (ref 10.0–291.0)

## 2019-08-07 LAB — IBC PANEL
Iron: 114 ug/dL (ref 42–145)
Saturation Ratios: 39.9 % (ref 20.0–50.0)
Transferrin: 204 mg/dL — ABNORMAL LOW (ref 212.0–360.0)

## 2019-08-07 LAB — VITAMIN D 25 HYDROXY (VIT D DEFICIENCY, FRACTURES): VITD: 29.23 ng/mL — ABNORMAL LOW (ref 30.00–100.00)

## 2019-08-07 NOTE — Patient Instructions (Addendum)
Good to see you Labs today Epidurals ordered Have a good vacation See me again 4 weeks after epidurals

## 2019-08-07 NOTE — Assessment & Plan Note (Signed)
Worsening problem of chronic problem.  Patient is having significant discomfort and pain.  Patient is having pain patient has imaging showing day 3 L4 does have significant amount of facet arthropathy as well as some mild narrowing of it could be causing some nerve impingement in the lumbar radiculopathy.  I would like to order an epidural to see how patient responds this would be diagnostic as well as therapeutic.  Discussed the different medications including gabapentin.

## 2019-08-07 NOTE — Progress Notes (Signed)
[place] Phone: [phone] Subjective:   Kelly [name], am serving as a scribe for Dr. Hulan Saas. This visit occurred during the SARS-CoV-2 public health emergency.  Safety protocols were in place, including screening questions prior to the visit, additional usage of staff PPE, and extensive cleaning of exam room while observing appropriate contact time as indicated for disinfecting solutions.    I'm seeing this patient by the request  of:  Hoyt Koch, MD  CC: Left shoulder follow-up, neck and back pain  [id number]   02/04/2019 Continues to have pain at this time.  I believe that this is more of a reactive bursitis.  Feels that the rotator cuff is fully healed at this time.  I do think patient needed injections.  There is still likely an exacerbation and this was a flare potentially from the accident but at this point I do believe patient should have maximal medical improvement with 0% disability.  Patient will follow-up again with me though to further evaluate in 6 to 8 weeks to make sure patient is doing well  Injection given today.  Tolerated the procedure well.  Discussed posture and ergonomics.  Patient is to increase activity as tolerated.  This could be more secondary to how patient has been favoring her back that could have been an injury from the accident.  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  We discussed with patient icing regimen, home exercises, patient would not want any surgical intervention.  Encourage her to continue to work on the exercises.  Follow-up with me again as needed  Update 08/07/2019 Kelly [name] is a 79 y.o. female coming in with complaint of right hip bursitis which was injected 9/22/2020and bilateral shoulder bursitis.  Patient states that she is unable to rotate head to right. Did have massage and this helped alleviate some pain.  Neck pain seems to be more severe.  Has responded well to an epidural previously last one was in June 2020.  Feels like the same pain again.  Also has left hip pain. Does exercise classes. soreness when she arises in morning. Massage therapist also did psoas massage which helped. Pain is in left SI joint and glute. Also having right sided piriformis tightness. Having a hard time performing sit to stand without pian. Injection last visit did help to reduce her pain.  Patient states that it seems to be radiating a little more than usual.  Starting to affect daily activities.  Patient is going on a vacation with her family that she has not seen in a year and would like to be active.  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    Past Medical History:  Diagnosis Date  . Allergy   . Blood transfusion without reported diagnosis   . Breast mass    fibocystic breast dx  . Hepatitis    CMV 1992  . Hyperlipidemia   . Postmenopausal HRT (hormone replacement therapy)   . Scarlet fever as a teen   Past Surgical History:  Procedure Laterality Date  . APPENDECTOMY    . BUNIONECTOMY    . caesarean section    . COLONOSCOPY    . CORRECTION HAMMER TOE    . FOOT TENDON SURGERY  left   . POLYPECTOMY    . TONSILLECTOMY     Social History   Socioeconomic History  . Marital status: Single    Spouse name: Not on file  . Number of children: 2  . Years of education: 65  . Highest education level: Not on file  Occupational History  . Occupation: MORTGAGE Surveyor, mining: [employer]    Comment: webinars on Doctor, hospital    Employer: [employer]  Tobacco Use  . Smoking status: Never Smoker  . Smokeless tobacco: Never Used  Substance and Sexual Activity  . Alcohol use: Yes      Comment: rarely will have a glass of wine  . Drug use: [name]  . Sexual activity: Yes    Partners: Male  Other Topics Concern  . 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 Lives alone and independently. Married '67--24 yrs. - widowed '92.  2 dtrs - '69, '73; 6 grandchildren. ACP/EoL - yes CPR, yes for short term ventilation, [name] prolonged heroic care in an irreversible state.    Social Determinants of Health   Financial Resource Strain:   . Difficulty of Paying Living Expenses:   Food Insecurity:   . Worried About Charity fundraiser in the Last Year:   . Arboriculturist in the Last Year:   Transportation Needs:   . Film/video editor (Medical):   Marland Kitchen Lack of Transportation (Non-Medical):   Physical Activity:   . Days of Exercise per Week:   . Minutes of Exercise per Session:   Stress:   . Feeling of Stress :   Social Connections:   . Frequency of Communication with Friends and Family:   . Frequency of Social Gatherings with Friends and Family:   . Attends Religious Services:   . Active Member of Clubs or Organizations:   . Attends Archivist Meetings:   Marland Kitchen Marital Status:    Allergies  Allergen Reactions  . Amoxicillin Rash  . Codeine Nausea Only   Family History  Problem Relation Age of Onset  . Pancreatic cancer Mother   . Hyperlipidemia Mother   . Hypertension Mother   . Polymyalgia rheumatica Mother   . Dementia Father     Current Outpatient Medications (Endocrine & Metabolic):  .  estradiol (CLIMARA - DOSED IN MG/24 HR) 0.05 mg/24hr patch, 1/2 patch placed on the skin weekly .  progesterone (PROMETRIUM) 200 MG capsule, Take 1 capsule (200 mg total) by mouth as directed. 12 days out of every 6 months. (Patient taking differently: Take 200 mg by mouth as directed. 2 months 14 days then skip a month)  Current Outpatient Medications (Cardiovascular):  .  atorvastatin (LIPITOR) 40 MG tablet, TAKE 1 TABLET BY MOUTH   DAILY     Current Outpatient Medications (Other):  .  estradiol (ESTRACE) 0.1 MG/GM vaginal cream, Place [id number] Applicatorfuls vaginally every Wednesday and Saturday. .  gabapentin (NEURONTIN) 100 MG capsule, Take 2 capsules (200 mg total) by mouth at bedtime. .  multivitamin (THERAGRAN) per tablet, Take 1 tablet by mouth daily.   Marland Kitchen  PREMARIN vaginal cream, Place 1 Applicatorful vaginally 2 (two) times a week.    Reviewed prior external information including notes and imaging from  primary care provider As well as notes that were available from care everywhere and other healthcare systems.  Past medical history, social, surgical and family history all reviewed in electronic medical  record.  [name] pertanent information unless stated regarding to the chief complaint.   Review of Systems:  [name] headache, visual changes, nausea, vomiting, diarrhea, constipation, dizziness, abdominal pain, skin rash, fevers, chills, night sweats, weight loss, swollen lymph nodes, , joint swelling, chest pain, shortness of breath, mood changes. POSITIVE muscle aches, body aches  Objective  Blood pressure 110/82, pulse 87, height 5\' 7"  (1.702 m), weight 211 lb (95.7 kg), SpO2 96 %.   General: [name] apparent distress alert and oriented x3 mood and affect normal, dressed appropriately.  HEENT: Pupils equal, extraocular movements intact  Respiratory: Patient's speak in full sentences and does not appear short of breath  Cardiovascular: [name] lower extremity edema, non tender, [name] erythema  Neuro: Cranial nerves II through XII are intact, neurovascularly intact in all extremities with 2+ DTRs and 2+ pulses.  Gait normal with good balance and coordination.  MSK: Left shoulder exam shows the patient does have good range of motion.  4+ out of 5 strength of the rotator cuff.  Tender to palpation minorly over the deltoid.  Very mild impingement with Neer and Hawkins still remaining. Neck exam loss of lordosis, significant loss of  extension with mild radicular symptoms.  Patient does have crepitus noted.  Low back exam patient ambulates antalgic Lee favoring the left side.  Tenderness to over the left sacroiliac joint.  Positive straight leg test on the left side.     Impression and Recommendations:     This case required medical decision making of moderate complexity. The above documentation has been reviewed and is accurate and complete Lyndal Pulley, DO       Note: This dictation was prepared with Dragon dictation along with smaller phrase technology. Any transcriptional errors that result from this process are unintentional.

## 2019-08-07 NOTE — Assessment & Plan Note (Signed)
Has responded well to epidural previously.  Patient feels like it is more of an exacerbation at the moment.  Discussed home exercises and icing regimen.  Increase activity as tolerated.  We will order another epidural and see patient in 3 weeks afterwards.

## 2019-08-09 LAB — ANA: Anti Nuclear Antibody (ANA): POSITIVE — AB

## 2019-08-09 LAB — RHEUMATOID FACTOR: Rheumatoid fact SerPl-aCnc: <14 IU/mL (ref <14)

## 2019-08-09 LAB — CYCLIC CITRUL PEPTIDE ANTIBODY, IGG: Cyclic Citrullin Peptide Ab: 51 UNITS — ABNORMAL HIGH

## 2019-08-09 LAB — ANTI-NUCLEAR AB-TITER (ANA TITER): ANA Titer 1: 1:40 {titer} — ABNORMAL HIGH

## 2019-08-09 LAB — ANGIOTENSIN CONVERTING ENZYME: Angiotensin-Converting Enzyme: 30 U/L (ref 9–67)

## 2019-08-09 LAB — CALCIUM, IONIZED: Calcium, Ion: 5.32 mg/dL (ref 4.8–5.6)

## 2019-08-09 LAB — PTH, INTACT AND CALCIUM
Calcium: 10.1 mg/dL (ref 8.6–10.4)
PTH: 19 pg/mL (ref 14–64)

## 2019-08-09 LAB — SAR COV2 SEROLOGY (COVID19)AB(IGG),IA: SARS CoV2 AB IGG: POSITIVE — AB

## 2019-08-18 ENCOUNTER — Other Ambulatory Visit: Payer: Medicare HMO

## 2019-08-19 ENCOUNTER — Ambulatory Visit
Admission: RE | Admit: 2019-08-19 | Discharge: 2019-08-19 | Disposition: A | Discharge status: Home / Self Care | Payer: Medicare HMO | Source: Ambulatory Visit | Attending: Family Medicine | Admitting: Family Medicine

## 2019-08-19 ENCOUNTER — Other Ambulatory Visit: Payer: Self-pay

## 2019-08-19 DIAGNOSIS — M545 Low back pain: Secondary | ICD-10-CM | POA: Diagnosis not present

## 2019-08-19 DIAGNOSIS — M2550 Pain in unspecified joint: Secondary | ICD-10-CM

## 2019-08-19 DIAGNOSIS — M47816 Spondylosis without myelopathy or radiculopathy, lumbar region: Secondary | ICD-10-CM | POA: Diagnosis not present

## 2019-08-19 MED ORDER — IOPAMIDOL (ISOVUE-M 200) INJECTION 41%
1.0000 mL | Freq: Once | INTRAMUSCULAR | Status: AC
  Administered 2019-08-19: 1 mL via EPIDURAL

## 2019-08-19 MED ORDER — METHYLPREDNISOLONE ACETATE 40 MG/ML INJ SUSP (RADIOLOG
120.0000 mg | Freq: Once | INTRAMUSCULAR | Status: AC
  Administered 2019-08-19: 12:00:00 120 mg via EPIDURAL

## 2019-08-19 NOTE — Discharge Instructions (Signed)

## 2019-08-28 DIAGNOSIS — H25013 Cortical age-related cataract, bilateral: Secondary | ICD-10-CM | POA: Diagnosis not present

## 2019-08-28 DIAGNOSIS — H40023 Open angle with borderline findings, high risk, bilateral: Secondary | ICD-10-CM | POA: Diagnosis not present

## 2019-08-28 DIAGNOSIS — H2513 Age-related nuclear cataract, bilateral: Secondary | ICD-10-CM | POA: Diagnosis not present

## 2019-08-28 DIAGNOSIS — H04123 Dry eye syndrome of bilateral lacrimal glands: Secondary | ICD-10-CM | POA: Diagnosis not present

## 2019-09-02 ENCOUNTER — Other Ambulatory Visit: Payer: Medicare HMO

## 2019-09-02 ENCOUNTER — Ambulatory Visit
Admission: RE | Admit: 2019-09-02 | Discharge: 2019-09-02 | Disposition: A | Discharge status: Home / Self Care | Payer: Medicare HMO | Source: Ambulatory Visit | Attending: Family Medicine | Admitting: Family Medicine

## 2019-09-02 ENCOUNTER — Other Ambulatory Visit: Payer: Self-pay

## 2019-09-02 DIAGNOSIS — M542 Cervicalgia: Secondary | ICD-10-CM | POA: Diagnosis not present

## 2019-09-02 DIAGNOSIS — M2550 Pain in unspecified joint: Secondary | ICD-10-CM

## 2019-09-02 MED ORDER — TRIAMCINOLONE ACETONIDE 40 MG/ML IJ SUSP (RADIOLOGY)
60.0000 mg | Freq: Once | INTRAMUSCULAR | Status: AC
  Administered 2019-09-02: 60 mg via EPIDURAL

## 2019-09-02 MED ORDER — IOPAMIDOL (ISOVUE-M 300) INJECTION 61%
1.0000 mL | Freq: Once | INTRAMUSCULAR | Status: AC | PRN
  Administered 2019-09-02: 13:00:00 1 mL via EPIDURAL

## 2019-09-02 NOTE — Discharge Instructions (Signed)

## 2019-09-09 ENCOUNTER — Ambulatory Visit: Payer: Medicare HMO | Admitting: Family Medicine

## 2019-09-10 ENCOUNTER — Encounter: Payer: Self-pay | Admitting: Internal Medicine

## 2019-09-10 ENCOUNTER — Ambulatory Visit (INDEPENDENT_AMBULATORY_CARE_PROVIDER_SITE_OTHER): Payer: Medicare HMO | Admitting: Internal Medicine

## 2019-09-10 ENCOUNTER — Other Ambulatory Visit: Payer: Self-pay

## 2019-09-10 VITALS — BP 118/70 | HR 74 | Temp 99.2°F | Ht 67.0 in | Wt 208.4 lb

## 2019-09-10 DIAGNOSIS — E785 Hyperlipidemia, unspecified: Secondary | ICD-10-CM

## 2019-09-10 DIAGNOSIS — Z9989 Dependence on other enabling machines and devices: Secondary | ICD-10-CM | POA: Diagnosis not present

## 2019-09-10 DIAGNOSIS — Z0000 Encounter for general adult medical examination without abnormal findings: Secondary | ICD-10-CM | POA: Diagnosis not present

## 2019-09-10 DIAGNOSIS — G4733 Obstructive sleep apnea (adult) (pediatric): Secondary | ICD-10-CM | POA: Diagnosis not present

## 2019-09-10 DIAGNOSIS — M5416 Radiculopathy, lumbar region: Secondary | ICD-10-CM

## 2019-09-10 NOTE — Patient Instructions (Signed)
Health Maintenance, Female Adopting a healthy lifestyle and getting preventive care are important in promoting health and wellness. Ask your health care provider about:  The right schedule for you to have regular tests and exams.  Things you can do on your own to prevent diseases and keep yourself healthy. What should I know about diet, weight, and exercise? Eat a healthy diet   Eat a diet that includes plenty of vegetables, fruits, low-fat dairy products, and lean protein.  Do not eat a lot of foods that are high in solid fats, added sugars, or sodium. Maintain a healthy weight Body mass index (BMI) is used to identify weight problems. It estimates body fat based on height and weight. Your health care provider can help determine your BMI and help you achieve or maintain a healthy weight. Get regular exercise Get regular exercise. This is one of the most important things you can do for your health. Most adults should:  Exercise for at least 150 minutes each week. The exercise should increase your heart rate and make you sweat (moderate-intensity exercise).  Do strengthening exercises at least twice a week. This is in addition to the moderate-intensity exercise.  Spend less time sitting. Even light physical activity can be beneficial. Watch cholesterol and blood lipids Have your blood tested for lipids and cholesterol at 77 years of age, then have this test every 5 years. Have your cholesterol levels checked more often if:  Your lipid or cholesterol levels are high.  You are older than 77 years of age.  You are at high risk for heart disease. What should I know about cancer screening? Depending on your health history and family history, you may need to have cancer screening at various ages. This may include screening for:  Breast cancer.  Cervical cancer.  Colorectal cancer.  Skin cancer.  Lung cancer. What should I know about heart disease, diabetes, and high blood  pressure? Blood pressure and heart disease  High blood pressure causes heart disease and increases the risk of stroke. This is more likely to develop in people who have high blood pressure readings, are of African descent, or are overweight.  Have your blood pressure checked: ? Every 3-5 years if you are 18-39 years of age. ? Every year if you are 40 years old or older. Diabetes Have regular diabetes screenings. This checks your fasting blood sugar level. Have the screening done:  Once every three years after age 40 if you are at a normal weight and have a low risk for diabetes.  More often and at a younger age if you are overweight or have a high risk for diabetes. What should I know about preventing infection? Hepatitis B If you have a higher risk for hepatitis B, you should be screened for this virus. Talk with your health care provider to find out if you are at risk for hepatitis B infection. Hepatitis C Testing is recommended for:  Everyone born from 1945 through 1965.  Anyone with known risk factors for hepatitis C. Sexually transmitted infections (STIs)  Get screened for STIs, including gonorrhea and chlamydia, if: ? You are sexually active and are younger than 77 years of age. ? You are older than 77 years of age and your health care provider tells you that you are at risk for this type of infection. ? Your sexual activity has changed since you were last screened, and you are at increased risk for chlamydia or gonorrhea. Ask your health care provider if   you are at risk.  Ask your health care provider about whether you are at high risk for HIV. Your health care provider may recommend a prescription medicine to help prevent HIV infection. If you choose to take medicine to prevent HIV, you should first get tested for HIV. You should then be tested every 3 months for as long as you are taking the medicine. Pregnancy  If you are about to stop having your period (premenopausal) and  you may become pregnant, seek counseling before you get pregnant.  Take 400 to 800 micrograms (mcg) of folic acid every day if you become pregnant.  Ask for birth control (contraception) if you want to prevent pregnancy. Osteoporosis and menopause Osteoporosis is a disease in which the bones lose minerals and strength with aging. This can result in bone fractures. If you are 65 years old or older, or if you are at risk for osteoporosis and fractures, ask your health care provider if you should:  Be screened for bone loss.  Take a calcium or vitamin D supplement to lower your risk of fractures.  Be given hormone replacement therapy (HRT) to treat symptoms of menopause. Follow these instructions at home: Lifestyle  Do not use any products that contain nicotine or tobacco, such as cigarettes, e-cigarettes, and chewing tobacco. If you need help quitting, ask your health care provider.  Do not use street drugs.  Do not share needles.  Ask your health care provider for help if you need support or information about quitting drugs. Alcohol use  Do not drink alcohol if: ? Your health care provider tells you not to drink. ? You are pregnant, may be pregnant, or are planning to become pregnant.  If you drink alcohol: ? Limit how much you use to 0-1 drink a day. ? Limit intake if you are breastfeeding.  Be aware of how much alcohol is in your drink. In the [place] one drink equals one 12 oz bottle of beer (355 mL), one 5 oz glass of wine (148 mL), or one 1 oz glass of hard liquor (44 mL). General instructions  Schedule regular health, dental, and eye exams.  Stay current with your vaccines.  Tell your health care provider if: ? You often feel depressed. ? You have ever been abused or do not feel safe at home. Summary  Adopting a healthy lifestyle and getting preventive care are important in promoting health and wellness.  Follow your health care provider's instructions about healthy  diet, exercising, and getting tested or screened for diseases.  Follow your health care provider's instructions on monitoring your cholesterol and blood pressure. This information is not intended to replace advice given to you by your health care provider. Make sure you discuss any questions you have with your health care provider. Document Revised: 04/24/2018 Document Reviewed: 04/24/2018 Elsevier Patient Education  2020 Elsevier Inc.  

## 2019-09-10 NOTE — Assessment & Plan Note (Signed)
Flu shot due next season. Covid-19 complete. Pneumonia complete. Shingrix counseled. Tetanus due 2023. Colonoscopy aged out prior to next screening. Mammogram due 2022, pap smear aged out and dexa declines further. Counseled about sun safety and mole surveillance. Counseled about the dangers of distracted driving. Given 10 year screening recommendations.

## 2019-09-10 NOTE — Addendum Note (Signed)
Addended by: Otilio Miu on: 09/10/2019 11:58 AM   Modules accepted: Orders

## 2019-09-10 NOTE — Assessment & Plan Note (Signed)
Much better after epidural.

## 2019-09-10 NOTE — Progress Notes (Signed)
Subjective:   Patient ID: Kelly [name], female    DOB: Feb 16, 1941, 79 y.o.   MRN: [id number]  HPI Here for medicare wellness and physical, no new complaints. Please see A/P for status and treatment of chronic medical problems.   Diet: heart healthy Physical activity: active Depression/mood screen: negative Hearing: hearing aids bilateral Visual acuity: grossly normal with contact 1 eye, getting cataracts June, performs annual eye exam  ADLs: capable Fall risk: none Home safety: good Cognitive evaluation: intact to orientation, naming, recall and repetition EOL planning: adv directives discussed    Office Visit from 09/10/2019 in [place] at [facility] Total Score  0       I have personally reviewed and have noted 1. The patient's medical and social history - reviewed today no changes 2. Their use of alcohol, tobacco or illicit drugs 3. Their current medications and supplements 4. The patient's functional ability including ADL's, fall risks, home safety risks and hearing or visual impairment. 5. Diet and physical activities 6. Evidence for depression or mood disorders 7. Care team reviewed and updated  Patient Care Team: Hoyt Koch, MD as PCP - General (Internal Medicine) Monna Fam, MD (Ophthalmology) Petrinitz, Dellis Filbert, DPM (Inactive) (Podiatry) Past Medical History:  Diagnosis Date  . Allergy   . Blood transfusion without reported diagnosis   . Breast mass    fibocystic breast dx  . Hepatitis    CMV 1992  . Hyperlipidemia   . Postmenopausal HRT (hormone replacement therapy)   . Scarlet fever as a teen   Past Surgical History:  Procedure Laterality Date  . APPENDECTOMY    . BUNIONECTOMY    . caesarean section    . COLONOSCOPY    . CORRECTION HAMMER TOE    . FOOT TENDON SURGERY     left   . POLYPECTOMY    . TONSILLECTOMY     Family History  Problem Relation Age of Onset  . Pancreatic cancer Mother   . Hyperlipidemia  Mother   . Hypertension Mother   . Polymyalgia rheumatica Mother   . Dementia Father    Review of Systems  Constitutional: Negative.   HENT: Negative.   Eyes: Negative.   Respiratory: Negative for cough, chest tightness and shortness of breath.   Cardiovascular: Positive for chest pain. Negative for palpitations and leg swelling.  Gastrointestinal: Negative for abdominal distention, abdominal pain, constipation, diarrhea, nausea and vomiting.  Musculoskeletal: Negative.   Skin: Negative.   Neurological: Negative.   Psychiatric/Behavioral: Negative.     Objective:  Physical Exam Constitutional:      Appearance: She is well-developed.  HENT:     Head: Normocephalic and atraumatic.  Cardiovascular:     Rate and Rhythm: Normal rate and regular rhythm.  Pulmonary:     Effort: Pulmonary effort is normal. No respiratory distress.     Breath sounds: Normal breath sounds. No wheezing or rales.  Abdominal:     General: Bowel sounds are normal. There is no distension.     Palpations: Abdomen is soft.     Tenderness: There is no abdominal tenderness. There is no rebound.  Musculoskeletal:     Cervical back: Normal range of motion.  Skin:    General: Skin is warm and dry.  Neurological:     Mental Status: She is alert and oriented to person, place, and time.     Coordination: Coordination normal.     Vitals:   09/10/19 0913  BP: 118/70  Pulse:  74  Temp: 99.2 F (37.3 C)  SpO2: 99%  Weight: 208 lb 6.4 oz (94.5 kg)  Height: 5\' 7"  (1.702 m)   EKG: Rate 70, axis normal, interval normal, sinus, no st or t wave changes, no significant change compared to 2012  This visit occurred during the SARS-CoV-2 public health emergency.  Safety protocols were in place, including screening questions prior to the visit, additional usage of staff PPE, and extensive cleaning of exam room while observing appropriate contact time as indicated for disinfecting solutions.   Assessment & Plan:

## 2019-09-10 NOTE — Assessment & Plan Note (Signed)
Using CPAP with good compliance  

## 2019-09-10 NOTE — Assessment & Plan Note (Signed)
EKG done due to chest pains which are atypical. This is unchanged. Checking lipid panel and adjust lipitor 40 mg daily as needed.

## 2019-09-29 ENCOUNTER — Ambulatory Visit: Payer: Medicare HMO | Admitting: Family Medicine

## 2019-09-29 ENCOUNTER — Other Ambulatory Visit: Payer: Self-pay

## 2019-09-29 ENCOUNTER — Encounter: Payer: Self-pay | Admitting: Family Medicine

## 2019-09-29 DIAGNOSIS — M5416 Radiculopathy, lumbar region: Secondary | ICD-10-CM | POA: Diagnosis not present

## 2019-09-29 DIAGNOSIS — M5030 Other cervical disc degeneration, unspecified cervical region: Secondary | ICD-10-CM | POA: Diagnosis not present

## 2019-09-29 MED ORDER — DULOXETINE HCL 20 MG PO CPEP: 20.0000 mg | ORAL_CAPSULE | Freq: Every day | ORAL | 0 refills | Status: DC

## 2019-09-29 NOTE — Assessment & Plan Note (Signed)
Known degenerative disc disease.  Continues to have some difficulty.  Did have some improvement with the epidural initially and now worsening pain again.  Patient never did take the gabapentin completely.  Patient encouraged to try Cymbalta for less side effect panel.  Hopefully patient will follow through with this.  Discussed icing regimen and home exercises, we discussed which activities to do which wants to avoid.  Increase activity slowly over the course the next several weeks.  If necessary we will repeat the epidural.  Follow-up again in 4 to 8 weeks
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
## 2019-09-29 NOTE — Progress Notes (Signed)
Kelly Kelly [name] Phone: [phone] Subjective:   Kelly Kelly [name], am serving as a scribe for Dr. Hulan [name] This visit occurred during the SARS-CoV-2 public health emergency.  Safety protocols were in place, including screening questions prior to the visit, additional usage of staff PPE, and extensive cleaning of exam room while observing appropriate contact time as indicated for disinfecting solutions.  I'm seeing this patient by the request  of:  Kelly Koch, MD  CC: Neck pain low back pain  [id number]   08/07/2019 Has responded well to epidural previously.  Patient feels like it is more of an exacerbation at the moment.  Discussed home exercises and icing regimen.  Increase activity as tolerated.  We will order another epidural and see patient in 3 weeks afterwards.  Worsening problem of chronic problem.  Patient is having significant discomfort and pain.  Patient is having pain patient has imaging showing day 3 L4 does have significant amount of facet arthropathy as well as some mild narrowing of it could be causing some nerve impingement in the lumbar radiculopathy.  I would like to order an epidural to see how patient responds this would be diagnostic as well as therapeutic.  Discussed the different medications including gabapentin.  Update 09/29/2019 Kelly Kelly [name] is a 79 y.o. female coming in with complaint of neck pain. Epidural on 09/02/2019. Patient states that she is having pain in right side with rotation. Pain is less since epidural. Patient is surprised that the cervical epidural did not last as long.   Pain is also coming back in lumbar spine.   Patient also feels like her energy levels are decreasing again. Also feels like she is getting out of breath easily.       Past Medical History:  Diagnosis Date  . Allergy   . Blood transfusion without reported diagnosis   . Breast mass    fibocystic breast dx  . Hepatitis    CMV 1992  . Hyperlipidemia   . Postmenopausal HRT (hormone replacement therapy)   . Scarlet fever as a teen   Past Surgical History:  Procedure Laterality Date  . APPENDECTOMY    . BUNIONECTOMY    . caesarean section    . COLONOSCOPY    . CORRECTION HAMMER TOE    . FOOT TENDON SURGERY     left   . POLYPECTOMY    . TONSILLECTOMY     Social History   Socioeconomic History  . Marital status: Single    Spouse name: Not on file  . Number of children: 2  . Years of education: 71  . Highest education level: Not on file  Occupational History  . Occupation: MORTGAGE Surveyor, mining: [employer]    Comment: webinars on Doctor, hospital    Employer: [employer]  Tobacco Use  . Smoking status: Never Smoker  . Smokeless tobacco: Never Used  Substance and Sexual Activity  . Alcohol use: Yes    Comment: rarely will have a glass of wine  . Drug use: Kelly [name]  . Sexual activity: Yes    Partners: Male  Other Topics Concern  . Not on file  Social History Narrative   Kelly Kelly [name] [employer]; Master's [employer]. Lives alone and independently. Married '67--24 yrs. - widowed '92.  2 dtrs - '69, '73; 6 grandchildren. ACP/EoL - yes CPR, yes for short term ventilation, Kelly [name] prolonged heroic care in an irreversible  state.    Social Determinants of Health   Financial Resource Strain:   . Difficulty of Paying Living Expenses:   Food Insecurity:   . Worried About Charity fundraiser in the Last Year:   . Arboriculturist in the Last Year:   Transportation Needs:   . Film/video editor (Medical):   Marland Kitchen Lack of Transportation (Non-Medical):   Physical Activity:   . Days of Exercise per Week:   . Minutes of Exercise per Session:   Stress:   . Feeling of Stress :   Social Connections:   . Frequency of Communication with Friends and Family:   . Frequency of Social Gatherings with Friends and Family:   . Attends Religious  Services:   . Active Member of Clubs or Organizations:   . Attends Archivist Meetings:   Marland Kitchen Marital Status:    Allergies  Allergen Reactions  . Amoxicillin Rash  . Codeine Nausea Only   Family History  Problem Relation Age of Onset  . Pancreatic cancer Mother   . Hyperlipidemia Mother   . Hypertension Mother   . Polymyalgia rheumatica Mother   . Dementia Father     Current Outpatient Medications (Endocrine & Metabolic):  .  estradiol (CLIMARA - DOSED IN MG/24 HR) 0.05 mg/24hr patch, 1/2 patch placed on the skin weekly .  progesterone (PROMETRIUM) 200 MG capsule, Take 1 capsule (200 mg total) by mouth as directed. 12 days out of every 6 months. (Patient taking differently: Take 200 mg by mouth as directed. 2 months 14 days then skip a month)  Current Outpatient Medications (Cardiovascular):  .  atorvastatin (LIPITOR) 40 MG tablet, TAKE 1 TABLET BY MOUTH  DAILY     Current Outpatient Medications (Other):  .  estradiol (ESTRACE) 0.1 MG/GM vaginal cream, Place [id number] Applicatorfuls vaginally every Wednesday and Saturday. .  gabapentin (NEURONTIN) 100 MG capsule, Take 2 capsules (200 mg total) by mouth at bedtime. .  multivitamin (THERAGRAN) per tablet, Take 1 tablet by mouth daily.   Marland Kitchen  PREMARIN vaginal cream, Place 1 Applicatorful vaginally 2 (two) times a week.  .  DULoxetine (CYMBALTA) 20 MG capsule, Take 1 capsule (20 mg total) by mouth daily.   Reviewed prior external information including notes and imaging from  primary care provider As well as notes that were available from care everywhere and other healthcare systems.  Past medical history, social, surgical and family history all reviewed in electronic medical record.  Kelly [name] pertanent information unless stated regarding to the chief complaint.   Review of Systems:  Kelly [name] headache, visual changes, nausea, vomiting, diarrhea, constipation, dizziness, abdominal pain, skin rash, fevers, chills, night sweats, weight loss,  swollen lymph nodes, body aches, joint swelling, chest pain, shortness of breath, mood changes. POSITIVE muscle aches  Objective  Blood pressure 120/70, pulse 86, height 5\' 7"  (1.702 m), weight 210 lb (95.3 kg), SpO2 94 %.   General: Kelly [name] apparent distress alert and oriented x3 mood and affect normal, dressed appropriately.  HEENT: Pupils equal, extraocular movements intact  Respiratory: Patient's speak in full sentences and does not appear short of breath  Gait normal with good balance and coordination.  MSK:  tender with full range of motion and good stability and symmetric strength and tone of shoulders, elbows, wrist, hip, knee and ankles bilaterally.  Neck exam does have some loss of lordosis.  Patient does have some limited sidebending rotation to the right.  Mild positive radicular symptoms in the C5  distribution on the right side.  Patient still has some mild weakness of the left rotator cuff noted.  Patient does have mild positive impingement on the left.  Crepitus of the neck noted.  Low back exam does have some loss of lordosis.  Tender to palpation in the paraspinal musculature.  Seems to be a negative straight leg test at this moment.  Still highly uncomfortable on physical exam   Impression and Recommendations:     This case required medical decision making of moderate complexity. The above documentation has been reviewed and is accurate and complete Lyndal Pulley, DO       Note: This dictation was prepared with Dragon dictation along with smaller phrase technology. Any transcriptional errors that result from this process are unintentional.

## 2019-09-29 NOTE — Patient Instructions (Signed)
Cymbalta 20mg  Repeat lumber epidural-[phone] Send a message 2-3 weeks after if you want cervical See me in 6 weeks

## 2019-10-02 ENCOUNTER — Other Ambulatory Visit: Payer: Self-pay

## 2019-10-02 ENCOUNTER — Other Ambulatory Visit (INDEPENDENT_AMBULATORY_CARE_PROVIDER_SITE_OTHER): Payer: Medicare HMO

## 2019-10-02 DIAGNOSIS — Z0000 Encounter for general adult medical examination without abnormal findings: Secondary | ICD-10-CM

## 2019-10-02 DIAGNOSIS — E785 Hyperlipidemia, unspecified: Secondary | ICD-10-CM | POA: Diagnosis not present

## 2019-10-02 LAB — LIPID PANEL
Cholesterol: 173 mg/dL (ref 0–200)
HDL: 41.8 mg/dL (ref >39.00)
NonHDL: 131.36
Total CHOL/HDL Ratio: 4
Triglycerides: 209 mg/dL — ABNORMAL HIGH (ref 0.0–149.0)
VLDL: 41.8 mg/dL — ABNORMAL HIGH (ref 0.0–40.0)

## 2019-10-02 LAB — COMPREHENSIVE METABOLIC PANEL
ALT: 18 U/L (ref 0–35)
AST: 18 U/L (ref 0–37)
Albumin: 4 g/dL (ref 3.5–5.2)
Alkaline Phosphatase: 44 U/L (ref 39–117)
BUN: 20 mg/dL (ref 6–23)
CO2: 29 mEq/L (ref 19–32)
Calcium: 9.2 mg/dL (ref 8.4–10.5)
Chloride: 103 mEq/L (ref 96–112)
Creatinine, Ser: 0.88 mg/dL (ref 0.40–1.20)
GFR: 62.3 mL/min (ref >60.00)
Glucose, Bld: 108 mg/dL — ABNORMAL HIGH (ref 70–99)
Potassium: 3.8 mEq/L (ref 3.5–5.1)
Sodium: 139 mEq/L (ref 135–145)
Total Bilirubin: 0.5 mg/dL (ref 0.2–1.2)
Total Protein: 6.8 g/dL (ref 6.0–8.3)

## 2019-10-02 LAB — CBC WITH DIFFERENTIAL/PLATELET
Basophils Absolute: 0 10*3/uL (ref 0.0–0.1)
Basophils Relative: 0.6 % (ref 0.0–3.0)
Eosinophils Absolute: 0.1 10*3/uL (ref 0.0–0.7)
Eosinophils Relative: 1.3 % (ref 0.0–5.0)
HCT: 38.5 % (ref 36.0–46.0)
Hemoglobin: 13.2 g/dL (ref 12.0–15.0)
Lymphocytes Relative: 37.8 % (ref 12.0–46.0)
Lymphs Abs: 2.2 10*3/uL (ref 0.7–4.0)
MCHC: 34.3 g/dL (ref 30.0–36.0)
MCV: 88.3 fl (ref 78.0–100.0)
Monocytes Absolute: 0.6 10*3/uL (ref 0.1–1.0)
Monocytes Relative: 10.5 % (ref 3.0–12.0)
Neutro Abs: 2.9 10*3/uL (ref 1.4–7.7)
Neutrophils Relative %: 49.8 % (ref 43.0–77.0)
Platelets: 343 10*3/uL (ref 150.0–400.0)
RBC: 4.36 Mil/uL (ref 3.87–5.11)
RDW: 13.9 % (ref 11.5–15.5)
WBC: 5.8 10*3/uL (ref 4.0–10.5)

## 2019-10-02 LAB — LDL CHOLESTEROL, DIRECT: Direct LDL: 93 mg/dL

## 2019-10-02 NOTE — Addendum Note (Signed)
Addended by: Cresenciano Lick on: 10/02/2019 08:53 AM   Modules accepted: Orders

## 2019-10-03 ENCOUNTER — Encounter: Payer: Self-pay | Admitting: Internal Medicine

## 2019-10-09 ENCOUNTER — Other Ambulatory Visit: Payer: Self-pay

## 2019-10-09 DIAGNOSIS — G4733 Obstructive sleep apnea (adult) (pediatric): Secondary | ICD-10-CM | POA: Diagnosis not present

## 2019-10-09 MED ORDER — ATORVASTATIN CALCIUM 40 MG PO TABS: 40.0000 mg | ORAL_TABLET | Freq: Every day | ORAL | 2 refills | Status: DC

## 2019-10-14 DIAGNOSIS — H2513 Age-related nuclear cataract, bilateral: Secondary | ICD-10-CM | POA: Diagnosis not present

## 2019-10-14 DIAGNOSIS — H25013 Cortical age-related cataract, bilateral: Secondary | ICD-10-CM | POA: Diagnosis not present

## 2019-10-14 DIAGNOSIS — H524 Presbyopia: Secondary | ICD-10-CM | POA: Diagnosis not present

## 2019-10-16 ENCOUNTER — Ambulatory Visit
Admission: RE | Admit: 2019-10-16 | Discharge: 2019-10-16 | Disposition: A | Discharge status: Home / Self Care | Payer: Medicare HMO | Source: Ambulatory Visit | Attending: Family Medicine | Admitting: Family Medicine

## 2019-10-16 ENCOUNTER — Other Ambulatory Visit: Payer: Self-pay

## 2019-10-16 DIAGNOSIS — M5416 Radiculopathy, lumbar region: Secondary | ICD-10-CM

## 2019-10-16 DIAGNOSIS — M47816 Spondylosis without myelopathy or radiculopathy, lumbar region: Secondary | ICD-10-CM | POA: Diagnosis not present

## 2019-10-16 MED ORDER — IOPAMIDOL (ISOVUE-M 200) INJECTION 41%
1.0000 mL | Freq: Once | INTRAMUSCULAR | Status: AC
  Administered 2019-10-16: 1 mL via EPIDURAL

## 2019-10-16 MED ORDER — METHYLPREDNISOLONE ACETATE 40 MG/ML INJ SUSP (RADIOLOG
120.0000 mg | Freq: Once | INTRAMUSCULAR | Status: AC
  Administered 2019-10-16: 120 mg via EPIDURAL

## 2019-10-16 NOTE — Discharge Instructions (Signed)

## 2019-10-23 ENCOUNTER — Other Ambulatory Visit: Payer: Self-pay | Admitting: Family Medicine

## 2019-12-04 ENCOUNTER — Encounter: Payer: Self-pay | Admitting: Family Medicine

## 2020-02-06 DIAGNOSIS — G4733 Obstructive sleep apnea (adult) (pediatric): Secondary | ICD-10-CM | POA: Diagnosis not present

## 2020-02-10 ENCOUNTER — Encounter: Payer: Self-pay | Admitting: Family Medicine

## 2020-02-11 ENCOUNTER — Encounter: Payer: Self-pay | Admitting: Family Medicine
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
## 2020-02-13 ENCOUNTER — Ambulatory Visit (INDEPENDENT_AMBULATORY_CARE_PROVIDER_SITE_OTHER): Payer: Medicare HMO | Admitting: Family Medicine

## 2020-02-13 ENCOUNTER — Encounter: Payer: Self-pay | Admitting: Family Medicine

## 2020-02-13 ENCOUNTER — Other Ambulatory Visit: Payer: Self-pay

## 2020-02-13 DIAGNOSIS — M5030 Other cervical disc degeneration, unspecified cervical region: Secondary | ICD-10-CM | POA: Diagnosis not present

## 2020-02-13 MED ORDER — GABAPENTIN 100 MG PO CAPS: 200.0000 mg | ORAL_CAPSULE | Freq: Every day | ORAL | 3 refills | Status: DC

## 2020-02-13 NOTE — Patient Instructions (Addendum)
Good to see you Gabapentin 100 mg at night I will try to have a note done very soon I am proud of what you have accomplished Epidural for the neck ordered See me again in 2 months if you continue to have trouble we will have labs

## 2020-02-19 ENCOUNTER — Encounter: Payer: Self-pay | Admitting: Family Medicine

## 2020-02-23 DIAGNOSIS — R69 Illness, unspecified: Secondary | ICD-10-CM | POA: Diagnosis not present

## 2020-02-26 DIAGNOSIS — Z1231 Encounter for screening mammogram for malignant neoplasm of breast: Secondary | ICD-10-CM | POA: Diagnosis not present

## 2020-02-26 LAB — HM MAMMOGRAPHY

## 2020-03-02 ENCOUNTER — Telehealth: Payer: Self-pay | Admitting: Family Medicine

## 2020-03-02 NOTE — Telephone Encounter (Signed)
I had said it does on the phone but she said that we could schedule a peer to peer to discuss that. So I scheduled

## 2020-03-02 NOTE — Telephone Encounter (Signed)
Peer to Peer is scheduled for tomorrow at 4:30  With doctor [facility].

## 2020-03-02 NOTE — Telephone Encounter (Signed)
It does follow a dermatome from time to time, does that change? Otherwise I can talk to them tomorrow

## 2020-03-02 NOTE — Telephone Encounter (Signed)
Anderson Malta from [facility] called stating that the patient is scheduled for a cervical epidural on Thursday but it is pending out reach (meaning they are going to deny it) because it does not follow the dermatone pattern.  She said that Dr Tamala Julian would possibly need to do a peer to peer or for [place] to talk to [place] directly to try to get it approved.  Anderson Malta can be reached at [phone] if you have any questions.

## 2020-03-04 ENCOUNTER — Ambulatory Visit
Admission: RE | Admit: 2020-03-04 | Discharge: 2020-03-04 | Disposition: A | Discharge status: Home / Self Care | Payer: Medicare HMO | Source: Ambulatory Visit | Attending: Family Medicine | Admitting: Family Medicine

## 2020-03-04 DIAGNOSIS — M5030 Other cervical disc degeneration, unspecified cervical region: Secondary | ICD-10-CM

## 2020-03-04 DIAGNOSIS — M47812 Spondylosis without myelopathy or radiculopathy, cervical region: Secondary | ICD-10-CM | POA: Diagnosis not present

## 2020-03-04 MED ORDER — TRIAMCINOLONE ACETONIDE 40 MG/ML IJ SUSP (RADIOLOGY)
60.0000 mg | Freq: Once | INTRAMUSCULAR | Status: AC
  Administered 2020-03-04: 60 mg via EPIDURAL

## 2020-03-04 MED ORDER — IOPAMIDOL (ISOVUE-M 300) INJECTION 61%
1.0000 mL | Freq: Once | INTRAMUSCULAR | Status: AC | PRN
  Administered 2020-03-04: 1 mL via INTRATHECAL

## 2020-03-04 NOTE — Discharge Instructions (Signed)

## 2020-03-12 DIAGNOSIS — R928 Other abnormal and inconclusive findings on diagnostic imaging of breast: Secondary | ICD-10-CM | POA: Diagnosis not present

## 2020-03-15 ENCOUNTER — Other Ambulatory Visit: Payer: Self-pay

## 2020-03-15 ENCOUNTER — Encounter: Payer: Self-pay | Admitting: Family Medicine

## 2020-03-15 ENCOUNTER — Ambulatory Visit: Payer: Medicare HMO | Admitting: Family Medicine

## 2020-03-15 DIAGNOSIS — M5030 Other cervical disc degeneration, unspecified cervical region: Secondary | ICD-10-CM | POA: Diagnosis not present
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
## 2020-03-15 NOTE — Assessment & Plan Note (Signed)
Patient is doing much better after the epidural.  Nearly 100% better at this time.  Patient can continue the gabapentin if it is helpful and follow-up with me as needed

## 2020-03-15 NOTE — Patient Instructions (Signed)
Great to see you Tell trainer no lifting overhead no more than 5lbs ever and keep hands within peripenial vision See me again when you need me

## 2020-03-31 ENCOUNTER — Other Ambulatory Visit: Payer: Self-pay | Admitting: Radiology

## 2020-03-31 DIAGNOSIS — N6311 Unspecified lump in the right breast, upper outer quadrant: Secondary | ICD-10-CM | POA: Diagnosis not present

## 2020-03-31 DIAGNOSIS — C50411 Malignant neoplasm of upper-outer quadrant of right female breast: Secondary | ICD-10-CM | POA: Diagnosis not present

## 2020-04-05 ENCOUNTER — Encounter: Payer: Self-pay | Admitting: Internal Medicine

## 2020-04-05 ENCOUNTER — Encounter: Payer: Self-pay | Admitting: *Deleted

## 2020-04-05 DIAGNOSIS — C50411 Malignant neoplasm of upper-outer quadrant of right female breast: Secondary | ICD-10-CM | POA: Insufficient documentation

## 2020-04-05 DIAGNOSIS — Z170 Estrogen receptor positive status [ER+]: Secondary | ICD-10-CM

## 2020-04-05 HISTORY — DX: Estrogen receptor positive status (ER+): Z17.0
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
## 2020-04-14 ENCOUNTER — Inpatient Hospital Stay: Payer: Medicare HMO | Attending: Oncology | Admitting: Oncology

## 2020-04-14 ENCOUNTER — Inpatient Hospital Stay: Payer: Medicare HMO

## 2020-04-14 ENCOUNTER — Encounter: Payer: Self-pay | Admitting: Oncology

## 2020-04-14 ENCOUNTER — Other Ambulatory Visit: Payer: Self-pay | Admitting: Surgery

## 2020-04-14 ENCOUNTER — Other Ambulatory Visit: Payer: Self-pay

## 2020-04-14 ENCOUNTER — Ambulatory Visit: Payer: Medicare HMO | Attending: Surgery | Admitting: Physical Therapy

## 2020-04-14 ENCOUNTER — Encounter: Payer: Self-pay | Admitting: General Practice

## 2020-04-14 ENCOUNTER — Ambulatory Visit
Admission: RE | Admit: 2020-04-14 | Discharge: 2020-04-14 | Disposition: A | Discharge status: Home / Self Care | Payer: Medicare HMO | Source: Ambulatory Visit | Attending: Radiation Oncology | Admitting: Radiation Oncology

## 2020-04-14 ENCOUNTER — Inpatient Hospital Stay (HOSPITAL_BASED_OUTPATIENT_CLINIC_OR_DEPARTMENT_OTHER): Payer: Medicare HMO | Admitting: Genetic Counselor

## 2020-04-14 ENCOUNTER — Encounter: Payer: Self-pay | Admitting: Radiation Oncology

## 2020-04-14 ENCOUNTER — Encounter: Payer: Self-pay | Admitting: Physical Therapy

## 2020-04-14 VITALS — BP 109/67 | HR 73 | Temp 97.3°F | Resp 18 | Ht 67.0 in | Wt 208.6 lb

## 2020-04-14 DIAGNOSIS — Z853 Personal history of malignant neoplasm of breast: Secondary | ICD-10-CM

## 2020-04-14 DIAGNOSIS — Z170 Estrogen receptor positive status [ER+]: Secondary | ICD-10-CM | POA: Diagnosis not present

## 2020-04-14 DIAGNOSIS — C50411 Malignant neoplasm of upper-outer quadrant of right female breast: Secondary | ICD-10-CM | POA: Diagnosis not present

## 2020-04-14 DIAGNOSIS — Z8041 Family history of malignant neoplasm of ovary: Secondary | ICD-10-CM | POA: Diagnosis not present

## 2020-04-14 DIAGNOSIS — Z800 Family history of malignant neoplasm of digestive organs: Secondary | ICD-10-CM

## 2020-04-14 DIAGNOSIS — C50919 Malignant neoplasm of unspecified site of unspecified female breast: Secondary | ICD-10-CM | POA: Diagnosis not present

## 2020-04-14 DIAGNOSIS — Z8042 Family history of malignant neoplasm of prostate: Secondary | ICD-10-CM

## 2020-04-14 DIAGNOSIS — C50911 Malignant neoplasm of unspecified site of right female breast: Secondary | ICD-10-CM | POA: Diagnosis not present

## 2020-04-14 LAB — CBC WITH DIFFERENTIAL (CANCER CENTER ONLY)
Abs Immature Granulocytes: 0.01 10*3/uL (ref 0.00–0.07)
Basophils Absolute: 0.1 10*3/uL (ref 0.0–0.1)
Basophils Relative: 1 %
Eosinophils Absolute: 0.1 10*3/uL (ref 0.0–0.5)
Eosinophils Relative: 1 %
HCT: 40.8 % (ref 36.0–46.0)
Hemoglobin: 13.5 g/dL (ref 12.0–15.0)
Immature Granulocytes: 0 %
Lymphocytes Relative: 39 %
Lymphs Abs: 2.9 10*3/uL (ref 0.7–4.0)
MCH: 29.1 pg (ref 26.0–34.0)
MCHC: 33.1 g/dL (ref 30.0–36.0)
MCV: 87.9 fL (ref 80.0–100.0)
Monocytes Absolute: 0.5 10*3/uL (ref 0.1–1.0)
Monocytes Relative: 7 %
Neutro Abs: 4 10*3/uL (ref 1.7–7.7)
Neutrophils Relative %: 52 %
Platelet Count: 360 10*3/uL (ref 150–400)
RBC: 4.64 MIL/uL (ref 3.87–5.11)
RDW: 13.1 % (ref 11.5–15.5)
WBC Count: 7.6 10*3/uL (ref 4.0–10.5)
nRBC: 0 % (ref 0.0–0.2)

## 2020-04-14 LAB — CMP (CANCER CENTER ONLY)
ALT: 18 U/L (ref 0–44)
AST: 17 U/L (ref 15–41)
Albumin: 4 g/dL (ref 3.5–5.0)
Alkaline Phosphatase: 55 U/L (ref 38–126)
Anion gap: 9 (ref 5–15)
BUN: 21 mg/dL (ref 8–23)
CO2: 25 mmol/L (ref 22–32)
Calcium: 10.2 mg/dL (ref 8.9–10.3)
Chloride: 106 mmol/L (ref 98–111)
Creatinine: 0.89 mg/dL (ref 0.44–1.00)
GFR, Estimated: >60 mL/min (ref >60)
Glucose, Bld: 118 mg/dL — ABNORMAL HIGH (ref 70–99)
Potassium: 3.8 mmol/L (ref 3.5–5.1)
Sodium: 140 mmol/L (ref 135–145)
Total Bilirubin: 0.4 mg/dL (ref 0.3–1.2)
Total Protein: 7.5 g/dL (ref 6.5–8.1)

## 2020-04-14 LAB — GENETIC SCREENING ORDER

## 2020-04-14 NOTE — Progress Notes (Signed)
Radiation Oncology         ([phone]) [phone] ________________________________  Name: Kelly [name]        MRN: [id number]  Date of Service: 04/14/2020 DOB: 28-Jun-1940  [id number], Real Cons, MD  Kelly Keens, MD     REFERRING PHYSICIAN: [name], MD   DIAGNOSIS: The encounter diagnosis was Malignant neoplasm of upper-outer quadrant of right breast in female, estrogen receptor positive ([place]).   HISTORY OF PRESENT ILLNESS: Kelly [name] is a 79 y.o. female seen in the multidisciplinary breast clinic for a new diagnosis of right breast cancer. The patient was noted to have screening detected asymmetry in the right breast.  Of note she has a family history of ovarian and faint family history of breast cancer.  On diagnostic imaging a mass was seen in the right upper quadrant, and subsequent ultrasound measured this at 10 o'clock position spanning 9 mm.  Her axilla was negative for adenopathy.  A biopsy on 03/31/2020 revealed a grade 2 invasive ductal carcinoma that was ER/PR positive, HER-2 was negative and Ki-67 was 5%.  She is seen today to discuss treatment recommendations for her cancer.    PREVIOUS RADIATION THERAPY: No   PAST MEDICAL HISTORY:  Past Medical History:  Diagnosis Date  . Allergy   . Blood transfusion without reported diagnosis   . Breast mass    fibocystic breast dx  . Hepatitis    CMV 1992  . Hyperlipidemia   . Postmenopausal HRT (hormone replacement therapy)   . Scarlet fever as a teen       PAST SURGICAL HISTORY: Past Surgical History:  Procedure Laterality Date  . APPENDECTOMY    . BUNIONECTOMY    . caesarean section    . COLONOSCOPY    . CORRECTION HAMMER TOE    . FOOT TENDON SURGERY     left   . POLYPECTOMY    . TONSILLECTOMY       FAMILY HISTORY:  Family History  Problem Relation Age of Onset  . Pancreatic cancer Mother   . Hyperlipidemia Mother   . Hypertension Mother   . Polymyalgia rheumatica Mother   . Dementia Father       SOCIAL HISTORY:  reports that she has never smoked. She has never used smokeless tobacco. She reports current alcohol use. She reports that she does not use drugs.  The patient is single and lives in [place]. She is accompanied by her brother, Dr. Mariel [name] who is a retired Teacher, music.   ALLERGIES: Amoxicillin and Codeine   MEDICATIONS:  Current Outpatient Medications  Medication Sig Dispense Refill  . atorvastatin (LIPITOR) 40 MG tablet Take 1 tablet (40 mg total) by mouth daily. 90 tablet 2  . DULoxetine (CYMBALTA) 20 MG capsule TAKE 1 CAPSULE BY MOUTH EVERY DAY 90 capsule 1  . estradiol (CLIMARA - DOSED IN MG/24 HR) 0.05 mg/24hr patch 1/2 patch placed on the skin weekly  7  . estradiol (ESTRACE) 0.1 MG/GM vaginal cream Place 1.09 Applicatorfuls vaginally every Wednesday and Saturday. 42.5 g 1  . gabapentin (NEURONTIN) 100 MG capsule Take 2 capsules (200 mg total) by mouth at bedtime. 180 capsule 3  . gabapentin (NEURONTIN) 100 MG capsule Take 2 capsules (200 mg total) by mouth at bedtime. 180 capsule 3  . multivitamin (THERAGRAN) per tablet Take 1 tablet by mouth daily.      Marland Kitchen PREMARIN vaginal cream Place 1 Applicatorful vaginally 2 (two) times a week.     . progesterone (PROMETRIUM) 200 MG capsule  Take 1 capsule (200 mg total) by mouth as directed. 12 days out of every 6 months. (Patient taking differently: Take 200 mg by mouth as directed. 2 months 14 days then skip a month)     No current facility-administered medications for this encounter.     REVIEW OF SYSTEMS: On review of systems, the patient reports that she is doing well overall. She denies concerns since her biopsy. No other complaints are verbalized.     PHYSICAL EXAM:  Wt Readings from Last 3 Encounters:  03/15/20 209 lb (94.8 kg)  02/13/20 211 lb (95.7 kg)  09/29/19 210 lb (95.3 kg)   Temp Readings from Last 3 Encounters:  09/10/19 99.2 F (37.3 C)  05/13/18 98.6 F (37 C)  05/13/18 98.6 F (37 C)    BP Readings from Last 3 Encounters:  03/15/20 110/72  03/04/20 122/75  02/13/20 110/80   Pulse Readings from Last 3 Encounters:  03/15/20 82  03/04/20 64  02/13/20 70    In general this is a well appearing caucasian female in no acute distress. She's alert and oriented x4 and appropriate throughout the examination. Cardiopulmonary assessment is negative for acute distress and she exhibits normal effort. Bilateral breast exam is deferred.    ECOG = 0  0 - Asymptomatic (Fully active, able to carry on all predisease activities without restriction)  1 - Symptomatic but completely ambulatory (Restricted in physically strenuous activity but ambulatory and able to carry out work of a light or sedentary nature. For example, light housework, office work)  2 - Symptomatic, <50% in bed during the day (Ambulatory and capable of all self care but unable to carry out any work activities. Up and about more than 50% of waking hours)  3 - Symptomatic, >50% in bed, but not bedbound (Capable of only limited self-care, confined to bed or chair 50% or more of waking hours)  4 - Bedbound (Completely disabled. Cannot carry on any self-care. Totally confined to bed or chair)  5 - Death   Eustace Pen MM, Creech RH, Tormey DC, et al. [phone]). "Toxicity and response criteria of the [facility]". [place] Oncol. 5 (6): 649-55    LABORATORY DATA:  Lab Results  Component Value Date   WBC 5.8 10/02/2019   HGB 13.2 10/02/2019   HCT 38.5 10/02/2019   MCV 88.3 10/02/2019   PLT 343.0 10/02/2019   Lab Results  Component Value Date   NA 139 10/02/2019   K 3.8 10/02/2019   CL 103 10/02/2019   CO2 29 10/02/2019   Lab Results  Component Value Date   ALT 18 10/02/2019   AST 18 10/02/2019   ALKPHOS 44 10/02/2019   BILITOT 0.5 10/02/2019      RADIOGRAPHY: No results found.     IMPRESSION/PLAN: 1. 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 Dr.  Lisbeth Renshaw discusses the pathology findings and reviews the nature of right breast disease. The consensus from the breast conference includes breast conservation with lumpectomy with likely sentinel node biopsy. Dr. Lisbeth Renshaw reviewed the rationale for external radiotherapy to the breast  to reduce risks of local recurrence followed by antiestrogen therapy. He also reviews scenarios for which some patients may avoid radiotherapy. We discussed the risks, benefits, short, and long term effects of radiotherapy, as well as the curative intent, and the patient is likely  interested in proceeding. Dr. Lisbeth Renshaw discusses the delivery and logistics of radiotherapy and anticipates a course of 4  weeks of radiotherapy based on her current work up. We will see her back a few weeks after surgery to discuss the simulation process and anticipate we starting radiotherapy about 4-6 weeks after surgery.  2. Possible genetic predisposition to malignancy. The patient is a candidate for genetic testing given her personal and family history. She was offered referral and is interested in meeting today with genetics.   In a visit lasting 60 minutes, greater than 50% of the time was spent face to face reviewing her case, as well as in preparation of, discussing, and coordinating the patient's care.  The above documentation reflects my direct findings during this shared patient visit. Please see the separate note by Dr. Lisbeth Renshaw on this date for the remainder of the patient's plan of care.    Carola Rhine, PAC

## 2020-04-14 NOTE — Therapy (Signed)
[place], [place], [address] Phone: [phone]   Fax:  [phone]  Physical Therapy Evaluation    Patient Details  Name: Kelly [name] MRN: [id number] Date of Birth: [birth year] Referring Provider (PT): Dr. [name]  NOTE: AFTER EVAL WAS INITIATED, IT WAS DETERMINED SHE DID NOT NEED A SENTINEL LYMPH NODE BIOPSY. EVAL WAS STOPPED, NO OBJECTIVE MEASUREMENTS WERE TAKEN AND PATIENT WAS NOT BILLED FOR EVAL.  Encounter Date: 04/14/2020   PT End of Session - 04/14/20 1441    Visit Number 1    Number of Visits 1    Date for PT Re-Evaluation --    Activity Tolerance Patient tolerated treatment well    Behavior During Therapy [facility] for tasks assessed/performed           Past Medical History:  Diagnosis Date  . Allergy   . Blood transfusion without reported diagnosis   . Breast cancer ([place])   . Breast mass    fibocystic breast dx  . Cataract   . Hepatitis    CMV 1992  . Hyperlipidemia   . Postmenopausal HRT (hormone replacement therapy)   . Scarlet fever as a teen  . Sleep apnea     Past Surgical History:  Procedure Laterality Date  . APPENDECTOMY    . BUNIONECTOMY    . caesarean section    . COLONOSCOPY    . CORRECTION HAMMER TOE    . FOOT TENDON SURGERY     left   . POLYPECTOMY    . TONSILLECTOMY      There were no vitals filed for this visit.    Subjective Assessment - 04/14/20 1435    Subjective Patient reports she is here today to be seen by her medical team for her newly diagnosed right breast cancer.    Patient is accompained by: Family member    Pertinent History Patient was diagnosed on 02/26/2020 with right grade II invasive ductal carcinoma breast cancer. It measures 9 mm and is located in the upper outer quadrant. It is ER/PR positive and HER2 negative with a Ki67 of 5%.    Patient Stated Goals Reduce lymphedema risk and learn post op shoulder ROM HEP    Currently  in Pain? 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 [name]    Onset Date/Surgical Date 02/26/20    Hand Dominance Right    Prior Therapy None      Precautions   Precautions Other (comment)    Precaution Comments active cancer      Restrictions   Weight Bearing Restrictions No      Balance Screen   Has the patient fallen in the past 6 months No    Has the patient had a decrease in activity level because of a fear of falling?  No    Is the patient reluctant to leave their home because of a fear of falling?  No      Home Environment   Living Environment Private residence    Living Arrangements Alone    Available Help at Discharge Family      Prior Function   Level of [place] Retired    Leisure She exercises in a program 3x/week at  [facility] for aout an hour      Cognition   Overall Cognitive Status Within Functional Limits for tasks assessed      Posture/Postural Control   Posture/Postural Control Postural limitations    Postural Limitations Rounded Shoulders;Forward head      ROM / Strength   AROM / PROM / Strength AROM;Strength      AROM   AROM Assessment Site Shoulder             LYMPHEDEMA/ONCOLOGY QUESTIONNAIRE - 04/14/20 0001      Type   Cancer Type Right breast cancer      Lymphedema Assessments   Lymphedema Assessments Upper extremities           L-DEX FLOWSHEETS - 04/14/20 1400      L-DEX LYMPHEDEMA SCREENING   Measurement Type --    L-DEX MEASUREMENT EXTREMITY --    POSITION  --    DOMINANT SIDE --    At Risk Side --                 Plan - 04/14/20 1441    Clinical Impression Statement Patient was diagnosed on 02/26/2020 with right grade II invasive ductal carcinoma breast cancer. It measures 9 mm and is located in the upper outer quadrant.  It is ER/PR positive and HER2 negative with a Ki67 of 5%. Her multidisciplinary medical team met prior to her assessments to determine a recommended treatment plan. She is planning to have a right lumpectomy but it has been determined she does not need a sentinel node biopsy.    Stability/Clinical Decision Making Stable/Uncomplicated    Clinical Decision Making Low    Rehab Potential Excellent    PT Frequency N/A   PT Treatment/Interventions Therapeutic exercise;Patient/family education;ADLs/Self Care Home Management    PT Next Visit Plan Will reassess 3-4 weeks post op to determine needs    PT Home Exercise Plan Post op shoulder ROM HEP    Consulted and Agree with Plan of Care Patient;Family member/caregiver    Family Member Consulted brother           Patient will benefit from skilled therapeutic intervention in order to improve the following deficits and impairments:  Postural dysfunction, Decreased range of motion, Decreased knowledge of precautions, Impaired UE functional use, Pain  Visit Diagnosis: Malignant neoplasm of upper-outer quadrant of right breast in female, estrogen receptor positive [facility])     Problem List Patient Active Problem List   Diagnosis Date Noted  . Malignant neoplasm of upper-outer quadrant of right breast in female, estrogen receptor positive ([place]) 04/05/2020  . Degenerative disc disease, cervical 08/07/2019  . Lumbar radiculopathy 04/11/2017  . 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 OSA on CPAP 12/23/2015  . Urinary incontinence 03/31/2014  . Fibrocystic breast disease 06/06/2011  . Routine health maintenance 11/12/2010  . Obesity 09/11/2007  . Hyperlipidemia 06/10/2007   NOTE: AFTER EVAL WAS INITIATED, IT WAS DETERMINED SHE DID NOT NEED A SENTINEL LYMPH NODE BIOPSY. EVAL WAS STOPPED, NO OBJECTIVE MEASUREMENTS WERE TAKEN AND PATIENT WAS NOT BILLED FOR EVAL.   Annia Friendly, [place] 04/14/20 3:00 PM  [place], [place], [address] Phone: [phone]   Fax:  [phone]  Name: Kelly [name] MRN: [id number] Date of Birth: 05-Feb-1941

## 2020-04-14 NOTE — Addendum Note (Signed)
Addended by: Jesse Fall on: 04/14/2020 04:17 PM   Modules accepted: Orders

## 2020-04-14 NOTE — Progress Notes (Signed)
[place] Psychosocial Distress Screening Spiritual Care  Met with Kelly [name]") and her brother in Breast Multidisciplinary Clinic to introduce [facility] team/resources, reviewing distress screen per protocol.  The patient scored a 1 on the Psychosocial Distress Thermometer which indicates mild distress. Also assessed for distress and other psychosocial needs.   ONCBCN DISTRESS SCREENING 04/14/2020  Screening Type Initial Screening  Distress experienced in past week (1-10) 1  Information Concerns Type Lack of info about diagnosis;Lack of info about treatment  Referral to support programs Yes   [name] believes that "knowledge is power" and feels very positive about her treatment plan and anticipated outcomes. She reports good support from friends and was interested to learn about [employer] as well.  Follow up needed: No. Per [name], no other needs or concerns at this time, but she is aware of ongoing Team availability should needs arise or circumstances change.   [place], [place], [facility] Pager [phone] Voicemail [phone]

## 2020-04-14 NOTE — Patient Instructions (Signed)

## 2020-04-15 ENCOUNTER — Encounter: Payer: Self-pay | Admitting: *Deleted

## 2020-04-15 ENCOUNTER — Encounter: Payer: Self-pay | Admitting: Genetic Counselor

## 2020-04-15 DIAGNOSIS — Z800 Family history of malignant neoplasm of digestive organs: Secondary | ICD-10-CM | POA: Insufficient documentation

## 2020-04-15 DIAGNOSIS — Z8042 Family history of malignant neoplasm of prostate: Secondary | ICD-10-CM

## 2020-04-15 DIAGNOSIS — Z8041 Family history of malignant neoplasm of ovary: Secondary | ICD-10-CM | POA: Insufficient documentation

## 2020-04-15 HISTORY — DX: Family history of malignant neoplasm of digestive organs: Z80.0

## 2020-04-15 HISTORY — DX: Family history of malignant neoplasm of prostate: Z80.42

## 2020-04-15 NOTE — Progress Notes (Signed)
REFERRING PROVIDER: Chauncey Cruel, MD [address] [place],  [place] [address]  PRIMARY PROVIDER:  Hoyt Koch, MD  PRIMARY REASON FOR VISIT:  1. Malignant neoplasm of upper-outer quadrant of right breast in female, estrogen receptor positive ([place])   2. Family history of pancreatic cancer   3. Family history of ovarian cancer   4. Family history of prostate cancer   5. Family history of colon cancer    HISTORY OF PRESENT ILLNESS:   Kelly [name], a 79 y.o. female, was seen for a [place] cancer genetics consultation during the breast multidisciplinary clinic at the request of Dr. Jana Hakim due to a personal and family history of cancer.  Kelly [name] presents to clinic today to discuss the possibility of a hereditary predisposition to cancer, to discuss genetic testing, and to further clarify her future cancer risks, as well as potential cancer risks for family members.   In November 2021, at the age of 12, Kelly [name] was diagnosed with invasive ductal carcinoma of the right breast (ER+/PR+/HER2-).  The preliminary treatment plan includes lumpectomy, consideration for adjuvant radiation, and anti-estrogens.  RISK FACTORS:  Menarche was at age 18.  First live birth at age 45.  OCP use for approximately 3 years.  Ovaries intact: yes.  Hysterectomy: no.  Menopausal status: postmenopausal.  HRT use: more than 10 years  Colonoscopy: yes Mammogram within the last year: yes.  Past Medical History:  Diagnosis Date  . Allergy   . Blood transfusion without reported diagnosis   . Breast cancer ([place])   . Breast mass    fibocystic breast dx  . Cataract   . Family history of colon cancer 04/15/2020  . Family history of pancreatic cancer 04/15/2020  . Family history of prostate cancer 04/15/2020  . Hepatitis    CMV 1992  . Hyperlipidemia   . Postmenopausal HRT (hormone replacement therapy)   . Scarlet fever as a teen  . Sleep apnea     Past Surgical History:    Procedure Laterality Date  . APPENDECTOMY    . BUNIONECTOMY    . caesarean section    . COLONOSCOPY    . CORRECTION HAMMER TOE    . FOOT TENDON SURGERY     left   . POLYPECTOMY    . TONSILLECTOMY      Social History   Socioeconomic History  . Marital status: Single    Spouse name: Not on file  . Number of children: 2  . Years of education: 33  . Highest education level: Not on file  Occupational History  . Occupation: MORTGAGE Surveyor, mining: [employer]    Comment: webinars on Doctor, hospital    Employer: [employer]  Tobacco Use  . Smoking status: Never Smoker  . Smokeless tobacco: Never Used  Vaping Use  . Vaping Use: Never used  Substance and Sexual Activity  . Alcohol use: Yes    Comment: rarely will have a glass of wine  . Drug use: No  . Sexual activity: Yes    Partners: Male  Other Topics Concern  . Not on file  Social History Narrative   Kelly [name] [employer]; Master's [employer]. Lives alone and independently. Married '67--24 yrs. - widowed '92.  2 dtrs - '69, '73; 6 grandchildren. ACP/EoL - yes CPR, yes for short term ventilation, no prolonged heroic care in an irreversible state.    Social Determinants of Health   Financial Resource Strain:   .  Difficulty of Paying Living Expenses: Not on file  Food Insecurity:   . Worried About Charity fundraiser in the Last Year: Not on file  . Ran Out of Food in the Last Year: Not on file  Transportation Needs:   . Lack of Transportation (Medical): Not on file  . Lack of Transportation (Non-Medical): Not on file  Physical Activity:   . Days of Exercise per Week: Not on file  . Minutes of Exercise per Session: Not on file  Stress:   . Feeling of Stress : Not on file  Social Connections:   . Frequency of Communication with Friends and Family: Not on file  . Frequency of Social Gatherings with Friends and Family: Not on file  . Attends Religious Services: Not on file  .  Active Member of Clubs or Organizations: Not on file  . Attends Archivist Meetings: Not on file  . Marital Status: Not on file     FAMILY HISTORY:  We obtained a detailed, 4-generation family history.  Significant diagnoses are listed below: Family History  Problem Relation Age of Onset  . Pancreatic cancer Mother 8  . Basal cell carcinoma Father        dx after 50, sun exposure  . Ovarian cancer Paternal Aunt        dx 67s  . Ovarian cancer Paternal Grandmother        d. early 29s  . Colon cancer Paternal Grandfather        dx early 24s  . Liver cancer Maternal Uncle        dx 50s  . Prostate cancer Maternal Uncle        dx >50  . Leukemia Cousin 68       maternal cousin      Kelly [name] has two living daughters, ages 57 an 70. Kelly [name] has three brothers and one sister, none of whom have had cancer.    Kelly [name] mother passed away at age 68 and had a history of diabetes in her 62s.  Kelly [name] had a maternal uncle with liver cancer in his 63s and had a maternal uncle with prostate cancer after the age of 76.  Kelly [name] maternal cousin had leukemia diagnosed at age 61.  Kelly [name] maternal grandfather had colon cancer diagnosed in his early 48s.  No other maternal family history of cancer was reported.   Kelly [name] father passed away at age 3 and had a history of basal cell carcinoma.  Kelly [name] paternal grandmother (died early 78s) and paternal aunt (diagnosed 60s) both had a history of ovarian cancer.  No other paternal family history of cancer was reported.   Kelly [name] is unaware of previous family history of genetic testing for hereditary cancer risks. Patient's ancestors are of White/Caucasian descent. There is no reported Ashkenazi Jewish ancestry. There is no known consanguinity.  GENETIC COUNSELING ASSESSMENT: Kelly [name] is a 80 y.o. female with a personal and family history of cancer which is somewhat suggestive of a hereditary cancer  syndrome, such as Hereditary Breast and Ovarian Cancer Syndrome, and predisposition to cancer given related diagnoses in her maternal and paternal family. We, therefore, discussed and recommended the following at today's visit.   DISCUSSION: We discussed that 5 - 10% of cancer is hereditary, with most cases of hereditary breast cancer associated with mutations in BRCA1/2.  There are other genes that can be associated with hereditary breast cancer syndromes.  Type of  cancer risk and level of risk are gene-specific.  We discussed that testing is beneficial for several reasons including knowing how to follow individuals after completing their treatment, identifying whether potential treatment would be beneficial, and understanding if other family members could be at risk for cancer and allowing them to undergo genetic testing.   We reviewed the characteristics, features and inheritance patterns of hereditary cancer syndromes. We also discussed genetic testing, including the appropriate family members to test, the process of testing, insurance coverage and turn-around-time for results. We discussed the implications of a negative, positive and/or variant of uncertain significant result. In order to get genetic test results in a timely manner so that Kelly [name] can use these genetic test results for surgical decisions, we recommended Kelly [name] pursue genetic testing for the Breast Cancer STAT Panel.  The STAT Breast cancer panel offered by [facility] includes sequencing and rearrangement analysis for the following 9 genes:  ATM, BRCA1, BRCA2, CDH1, CHEK2, PALB2, PTEN, STK11 and TP53.   Once complete, we recommend Kelly [name] pursue reflex genetic testing to a more comprehensive gene panel.   Kelly [name]  was offered a common hereditary cancer panel (48 genes) and an expanded pan-cancer panel (85 genes). Kelly [name] was informed of the benefits and limitations of each panel, including that expanded pan-cancer panels  contain several preliminary evidence genes that do not have clear management guidelines at this point in time.  We also discussed that as the number of genes included on a panel increases, the chances of variants of uncertain significance increases.  After considering the benefits and limitations of each gene panel, Ms. Ticer  elected to have an expanded Radio broadcast assistant through [facility].  The Multi-Cancer Panel offered by [facility] includes sequencing and/or deletion duplication testing of the following 85 genes: AIP, ALK, APC, ATM, AXIN2,BAP1,  BARD1, BLM, BMPR1A, BRCA1, BRCA2, BRIP1, CASR, CDC73, CDH1, CDK4, CDKN1B, CDKN1C, CDKN2A (p14ARF), CDKN2A (p16INK4a), CEBPA, CHEK2, CTNNA1, DICER1, DIS3L2, EGFR (c.2369C>T, p.Thr790Met variant only), EPCAM (Deletion/duplication testing only), FH, FLCN, GATA2, GPC3, GREM1 (Promoter region deletion/duplication testing only), HOXB13 (c.251G>A, p.Gly84Glu), HRAS, KIT, MAX, MEN1, MET, MITF (c.952G>A, p.Glu318Lys variant only), MLH1, MSH2, MSH3, MSH6, MUTYH, NBN, NF1, NF2, NTHL1, PALB2, PDGFRA, PHOX2B, PMS2, POLD1, POLE, POT1, PRKAR1A, PTCH1, PTEN, RAD50, RAD51C, RAD51D, RB1, RECQL4, RET, RNF43, RUNX1, SDHAF2, SDHA (sequence changes only), SDHB, SDHC, SDHD, SMAD4, SMARCA4, SMARCB1, SMARCE1, STK11, SUFU, TERC, TERT, TMEM127, TP53, TSC1, TSC2, VHL, WRN and WT1.   Based on Ms. Jehle's personal history of breast cancer and family history of ovarian and pancreatic cancers, she meets medical criteria for genetic testing. Despite that she meets criteria, she may still have an out of pocket cost.   PLAN: After considering the risks, benefits, and limitations, Ms. Buda provided informed consent to pursue genetic testing and the blood sample was sent to [facility] for analysis of the STAT+Multi-Cancer Panel. Results should be available within approximately 1-2 weeks' time, at which point they will be disclosed by telephone to Ms. Bernat, as will any additional  recommendations warranted by these results. Ms. Delafuente will receive a summary of her genetic counseling visit and a copy of her results once available. This information will also be available in Epic.   Lastly, we encouraged Ms. Lueth to remain in contact with cancer genetics annually so that we can continuously update the family history and inform her of any changes in cancer genetics and testing that may be of benefit for this family.   Ms. Traynham questions were  answered to her satisfaction today. Our contact information was provided should additional questions or concerns arise. Thank you for the referral and allowing [place] to share in the care of your patient.    M. Joette Catching, [place], [place] Film/video [email or web address] (P) [phone]  The patient was seen for a total of 20 minutes in face-to-face genetic counseling.  This patient was discussed with Drs. Magrinat, Lindi Adie and/or Burr Medico who agrees with the above.   _______________________________________________________ For Office Staff:  Number of people involved in session: 1 Was an Intern/ student involved with case: no

## 2020-04-19 ENCOUNTER — Telehealth: Payer: Self-pay | Admitting: *Deleted

## 2020-04-19 ENCOUNTER — Encounter: Payer: Self-pay | Admitting: *Deleted

## 2020-04-19 DIAGNOSIS — C50411 Malignant neoplasm of upper-outer quadrant of right female breast: Secondary | ICD-10-CM

## 2020-04-19 DIAGNOSIS — Z170 Estrogen receptor positive status [ER+]: Secondary | ICD-10-CM

## 2020-04-19 NOTE — Pre-Procedure Instructions (Signed)
[id number] - [place], [place] [address] Phone: [phone] Fax: [phone]      Your procedure is scheduled on Tuesday December 14th.  Report to [facility] Main Entrance "A" at 7:00 A.M., and check in at the Admitting office.  Call this number if you have problems the morning of surgery:  [phone]  Call [phone] if you have any questions prior to your surgery date Monday-Friday 8am-4pm    Remember:  Do not eat after midnight the night before your surgery  You may drink clear liquids until 6:00 the morning of your surgery.   Clear liquids allowed are: Water, Non-Citrus Juices (without pulp), Carbonated Beverages, Clear Tea, Black Coffee Only, and Gatorade  Patient Instructions  . The night before surgery:  o No food after midnight. ONLY clear liquids after midnight  . The day of surgery (if you do NOT have diabetes):  o Drink ONE (1) Pre-Surgery Clear Ensure as directed.   o This drink was given to you during your hospital  pre-op appointment visit. o The pre-op nurse will instruct you on the time to drink the  Pre-Surgery Ensure depending on your surgery time. o Finish the drink by 6:00 AM the morning of your surgery. o Nothing else to drink after completing the  Pre-Surgery Clear Ensure.          If you have questions, please contact your surgeon's office.     Take these medicines the morning of surgery with A SIP OF WATER  atorvastatin (LIPITOR)   As of today, STOP taking any Aspirin (unless otherwise instructed by your surgeon) Aleve, Naproxen, Ibuprofen, Motrin, Advil, Goody's, BC's, all herbal medications, fish oil, and all vitamins.                      Do not wear jewelry, make up, or nail polish            Do not wear lotions, powders, perfumes/colognes, or deodorant.            Do not shave 48 hours prior to surgery.  Men may shave face and neck.            Do not bring valuables to the hospital.             [facility] is not responsible for any belongings or valuables.  Do NOT Smoke (Tobacco/Vaping) or drink Alcohol 24 hours prior to your procedure If you use a CPAP at night, you may bring all equipment for your overnight stay.   Contacts, glasses, dentures or bridgework may not be worn into surgery.      For patients admitted to the hospital, discharge time will be determined by your treatment team.   Patients discharged the day of surgery will not be allowed to drive home, and someone needs to stay with them for 24 hours.    Special instructions:   [facility]- Preparing For Surgery  Before surgery, you can play an important role. Because skin is not sterile, your skin needs to be as free of germs as possible. You can reduce the number of germs on your skin by washing with CHG (chlorahexidine gluconate) Soap before surgery.  CHG is an antiseptic cleaner which kills germs and bonds with the skin to continue killing germs even after washing.    Oral Hygiene is also important to reduce your risk of infection.  Remember - BRUSH YOUR TEETH THE MORNING OF  SURGERY WITH YOUR REGULAR TOOTHPASTE  Please do not use if you have an allergy to CHG or antibacterial soaps. If your skin becomes reddened/irritated stop using the CHG.  Do not shave (including legs and underarms) for at least 48 hours prior to first CHG shower. It is OK to shave your face.  Please follow these instructions carefully.   1. Shower the NIGHT BEFORE SURGERY and the MORNING OF SURGERY with CHG Soap.   2. If you chose to wash your hair, wash your hair first as usual with your normal shampoo.  3. After you shampoo, rinse your hair and body thoroughly to remove the shampoo.  4. Use CHG as you would any other liquid soap. You can apply CHG directly to the skin and wash gently with a scrungie or a clean washcloth.   5. Apply the CHG Soap to your body ONLY FROM THE NECK DOWN.  Do not use on open wounds or open sores.  Avoid contact with your eyes, ears, mouth and genitals (private parts). Wash Face and genitals (private parts)  with your normal soap.   6. Wash thoroughly, paying special attention to the area where your surgery will be performed.  7. Thoroughly rinse your body with warm water from the neck down.  8. DO NOT shower/wash with your normal soap after using and rinsing off the CHG Soap.  9. Pat yourself dry with a CLEAN TOWEL.  10. Wear CLEAN PAJAMAS to bed the night before surgery  11. Place CLEAN SHEETS on your bed the night of your first shower and DO NOT SLEEP WITH PETS.   Day of Surgery: Wear Clean/Comfortable clothing the morning of surgery Do not apply any deodorants/lotions.   Remember to brush your teeth WITH YOUR REGULAR TOOTHPASTE.   Please read over the following fact sheets that you were given.

## 2020-04-19 NOTE — Telephone Encounter (Signed)
Spoke to pt concerning [facility] from 12.1.21. Denies questions or concerns regarding dx or treatment care plan. Encourage pt to call with needs. Received verbal understanding.

## 2020-04-20 ENCOUNTER — Other Ambulatory Visit: Payer: Self-pay

## 2020-04-20 ENCOUNTER — Telehealth: Payer: Self-pay | Admitting: Oncology

## 2020-04-20 ENCOUNTER — Encounter (HOSPITAL_COMMUNITY): Payer: Self-pay

## 2020-04-20 ENCOUNTER — Encounter (HOSPITAL_COMMUNITY)
Admission: RE | Admit: 2020-04-20 | Discharge: 2020-04-20 | Disposition: A | Discharge status: Home / Self Care | Payer: Medicare HMO | Source: Ambulatory Visit | Attending: Surgery | Admitting: Surgery

## 2020-04-20 DIAGNOSIS — G4733 Obstructive sleep apnea (adult) (pediatric): Secondary | ICD-10-CM | POA: Insufficient documentation

## 2020-04-20 DIAGNOSIS — Z6832 Body mass index (BMI) 32.0-32.9, adult: Secondary | ICD-10-CM | POA: Insufficient documentation

## 2020-04-20 DIAGNOSIS — C50911 Malignant neoplasm of unspecified site of right female breast: Secondary | ICD-10-CM | POA: Insufficient documentation

## 2020-04-20 DIAGNOSIS — E669 Obesity, unspecified: Secondary | ICD-10-CM | POA: Insufficient documentation

## 2020-04-20 DIAGNOSIS — Z01812 Encounter for preprocedural laboratory examination: Secondary | ICD-10-CM | POA: Diagnosis not present

## 2020-04-20 DIAGNOSIS — Z79899 Other long term (current) drug therapy: Secondary | ICD-10-CM | POA: Diagnosis not present

## 2020-04-20 DIAGNOSIS — E785 Hyperlipidemia, unspecified: Secondary | ICD-10-CM | POA: Insufficient documentation

## 2020-04-20 NOTE — Telephone Encounter (Signed)
Scheduled appts per 12/1 los. Pt confirmed appt date and time.

## 2020-04-20 NOTE — Progress Notes (Signed)
PCP - Pricilla Holm Cardiologist - denies  Chest x-ray - N/A EKG - N/A Stress Test - denies  ECHO - denies Cardiac Cath - denies  Sleep Study - +OSA CPAP - wears QHS   Aspirin Instructions: Patient instructed to hold all Aspirin, NSAID's, herbal medications, fish oil and vitamins 7 days prior to surgery.   Anesthesia review: breast seed- appointment 04/26/20.  Covid Test- 04/23/20 at at [facility]. Pt instructed to remain in their car. Educated on Transport planner until [employer].   Patient denies shortness of breath, fever, cough and chest pain at PAT appointment   Patient verbalized understanding of instructions that were given to them at the PAT appointment. Patient was also instructed that they will need to review over the PAT instructions again at home before surgery.

## 2020-04-21 ENCOUNTER — Inpatient Hospital Stay
Admission: RE | Admit: 2020-04-21 | Discharge: 2020-04-21 | Disposition: A | Discharge status: Home / Self Care | Payer: Self-pay | Source: Ambulatory Visit | Attending: Radiation Oncology | Admitting: Radiation Oncology

## 2020-04-21 ENCOUNTER — Ambulatory Visit
Admission: RE | Admit: 2020-04-21 | Discharge: 2020-04-21 | Disposition: A | Discharge status: Home / Self Care | Payer: Self-pay | Source: Ambulatory Visit | Attending: Radiation Oncology | Admitting: Radiation Oncology

## 2020-04-21 ENCOUNTER — Other Ambulatory Visit: Payer: Self-pay | Admitting: Radiation Oncology

## 2020-04-21 DIAGNOSIS — Z170 Estrogen receptor positive status [ER+]: Secondary | ICD-10-CM

## 2020-04-21 DIAGNOSIS — C50411 Malignant neoplasm of upper-outer quadrant of right female breast: Secondary | ICD-10-CM

## 2020-04-21 NOTE — Anesthesia Preprocedure Evaluation (Addendum)
Anesthesia Evaluation  Patient identified by MRN, date of birth, ID band Patient awake  General Assessment Comment:Right breast cancer  Reviewed: Allergy & Precautions, NPO status , Patient's Chart, lab work & pertinent test results  History of Anesthesia Complications Negative for: history of anesthetic complications  Airway Mallampati: II  TM Distance: >3 FB Neck ROM: Full    Dental  (+) Teeth Intact   Pulmonary sleep apnea and Continuous Positive Airway Pressure Ventilation ,    Pulmonary exam normal breath sounds clear to auscultation       Cardiovascular Exercise Tolerance: Good negative cardio ROS Normal cardiovascular exam Rhythm:Regular Rate:Normal     Neuro/Psych  Neuromuscular disease    GI/Hepatic negative GI ROS, Neg liver ROS, neg GERD  ,  Endo/Other  Obesity   Renal/GU negative Renal ROS     Musculoskeletal  (+) Arthritis ,   Abdominal   Peds  Hematology negative hematology ROS (+)   Anesthesia Other Findings   Reproductive/Obstetrics                            Anesthesia Physical Anesthesia Plan  ASA: II  Anesthesia Plan: General   Post-op Pain Management:    Induction: Intravenous  PONV Risk Score and Plan: 3 and Dexamethasone and Ondansetron  Airway Management Planned: LMA  Additional Equipment:   Intra-op Plan:   Post-operative Plan: Extubation in OR  Informed Consent: I have reviewed the patients History and Physical, chart, labs and discussed the procedure including the risks, benefits and alternatives for the proposed anesthesia with the patient or authorized representative who has indicated his/her understanding and acceptance.       Plan Discussed with: CRNA  Anesthesia Plan Comments: (PAT note written 04/21/2020 by Myra Gianotti, PA-C. )      Anesthesia Quick Evaluation

## 2020-04-21 NOTE — Progress Notes (Signed)
Anesthesia Chart Review:  Case: [id number] Date/Time: 04/27/20 0715   Procedure: RIGHT BREAST LUMPECTOMY WITH RADIOACTIVE SEED LOCALIZATION (Right Breast)   Anesthesia type: General   Pre-op diagnosis: RIGHT BREAST CANCER   Location: [facility] OR ROOM 02 / Cut Off OR   Surgeons: [name], MD      DISCUSSION: Patient is a 79 year old female scheduled for the above procedure.  History includes never smoker, HLD, CMV Hepatitis (1992), OSA (CPAP), right breast cancer (biopsy 03/31/20: invasive mammary carcinoma). BMI is consistent with obesity.  RSL is scheduled for 04/26/20. Presurgical COVID-19 test is scheduled for 04/23/20. By oncology notes, "COVID 19 VACCINATION STATUS: Status post Moderna vaccine x2+ booster October 2021".  Anesthesia team to evaluate on the day of surgery.   VS: BP 110/74   Pulse 90   Temp 36.5 C (Oral)   Resp 18   Ht 5\' 7"  (1.702 m)   Wt 94.8 kg   SpO2 93%   BMI 32.75 kg/m     PROVIDERS: Hoyt Koch, MD is PCP  Magrinat, Sarajane Jews, MD is HEM-ONC Kyung Rudd, MD is RAD-ONC   LABS: Labs reviewed: Acceptable for surgery. (all labs ordered are listed, but only abnormal results are displayed)  Labs Reviewed - No data to display   EKG: 09/10/19: Per Dr. Nathanial Millman office note, "EKG: Rate 70, axis normal, interval normal, sinus, no st or t wave changes, no significant change compared to 2012".  - Unable to obtain copy of EKG tracing from Dr. Nathanial Millman office, and medical records [phone]) had not received a copy of the tracing.     CV: N/A  Past Medical History:  Diagnosis Date  . Allergy   . Blood transfusion without reported diagnosis   . Breast cancer ([place])   . Breast mass    fibocystic breast dx  . Cataract   . Family history of colon cancer 04/15/2020  . Family history of pancreatic cancer 04/15/2020  . Family history of prostate cancer 04/15/2020  . Hepatitis    CMV 1992  . Hyperlipidemia   . Postmenopausal HRT (hormone  replacement therapy)   . Scarlet fever as a teen  . Sleep apnea     Past Surgical History:  Procedure Laterality Date  . APPENDECTOMY    . BUNIONECTOMY    . caesarean section    . COLONOSCOPY    . CORRECTION HAMMER TOE    . FOOT TENDON SURGERY     left   . POLYPECTOMY    . TONSILLECTOMY    . TONSILLECTOMY      MEDICATIONS: . atorvastatin (LIPITOR) 40 MG tablet  . Calcium Citrate (CITRACAL PO)  . cholecalciferol (VITAMIN D3) 25 MCG (1000 UNIT) tablet  . Coenzyme Q10 (COQ10) 200 MG CAPS  . DULoxetine (CYMBALTA) 20 MG capsule  . gabapentin (NEURONTIN) 100 MG capsule  . Multiple Vitamins-Minerals (SPECTRAVITE) TABS  . Omega-3 Fatty Acids (FISH OIL) 1200 MG CAPS  . progesterone (PROMETRIUM) 200 MG capsule   No current facility-administered medications for this encounter.    Myra Gianotti, PA-C Surgical Short Stay/Anesthesiology [facility] Phone [phone] [facility] [place] Phone [phone] 04/21/2020 4:08 PM

## 2020-04-22 ENCOUNTER — Encounter: Payer: Self-pay | Admitting: *Deleted

## 2020-04-23 ENCOUNTER — Other Ambulatory Visit (HOSPITAL_COMMUNITY)
Admission: RE | Admit: 2020-04-23 | Discharge: 2020-04-23 | Disposition: A | Discharge status: Home / Self Care | Payer: Medicare HMO | Source: Ambulatory Visit | Attending: Surgery | Admitting: Surgery

## 2020-04-23 DIAGNOSIS — Z01812 Encounter for preprocedural laboratory examination: Secondary | ICD-10-CM | POA: Diagnosis not present

## 2020-04-23 DIAGNOSIS — Z20822 Contact with and (suspected) exposure to covid-19: Secondary | ICD-10-CM | POA: Diagnosis not present

## 2020-04-23 LAB — SARS CORONAVIRUS 2 (TAT 6-24 HRS): SARS Coronavirus 2: NEGATIVE

## 2020-04-26 ENCOUNTER — Encounter: Payer: Self-pay | Admitting: Genetic Counselor

## 2020-04-26 ENCOUNTER — Telehealth: Payer: Self-pay | Admitting: Genetic Counselor

## 2020-04-26 ENCOUNTER — Ambulatory Visit: Payer: Self-pay | Admitting: Genetic Counselor

## 2020-04-26 DIAGNOSIS — Z8042 Family history of malignant neoplasm of prostate: Secondary | ICD-10-CM

## 2020-04-26 DIAGNOSIS — Z1379 Encounter for other screening for genetic and chromosomal anomalies: Secondary | ICD-10-CM

## 2020-04-26 DIAGNOSIS — C50411 Malignant neoplasm of upper-outer quadrant of right female breast: Secondary | ICD-10-CM | POA: Diagnosis not present

## 2020-04-26 DIAGNOSIS — Z8041 Family history of malignant neoplasm of ovary: Secondary | ICD-10-CM

## 2020-04-26 DIAGNOSIS — Z800 Family history of malignant neoplasm of digestive organs: Secondary | ICD-10-CM

## 2020-04-26 DIAGNOSIS — Z170 Estrogen receptor positive status [ER+]: Secondary | ICD-10-CM

## 2020-04-26 HISTORY — DX: Encounter for other screening for genetic and chromosomal anomalies: Z13.79

## 2020-04-26 NOTE — Progress Notes (Signed)
HPI:  Kelly [name] was previously seen in the [facility] due to a personal and family history of cancer and concerns regarding a hereditary predisposition to cancer. Please refer to our prior cancer genetics clinic note for more information regarding our discussion, assessment and recommendations, at the time. Kelly [name] recent genetic test results were disclosed to her, as were recommendations warranted by these results. These results and recommendations are discussed in more detail below.  CANCER HISTORY:   In November 2021, at the age of 79, Kelly [name] was diagnosed with invasive ductal carcinoma of the right breast (ER+/PR+/HER2-).  The preliminary treatment plan includes lumpectomy, consideration for adjuvant radiation, and anti-estrogens. Oncology History  Malignant neoplasm of upper-outer quadrant of right breast in female, estrogen receptor positive ([place])  04/05/2020 Initial Diagnosis   Malignant neoplasm of upper-outer quadrant of right breast in female, estrogen receptor positive ([place])   04/24/2020 Genetic Testing   Negative genetic testing: no pathogenic variants detected in [facility] Multi-Cancer Panel.  The report date is April 24, 2020.   The Multi-Cancer Panel offered by [facility] includes sequencing and/or deletion duplication testing of the following 85 genes: AIP, ALK, APC, ATM, AXIN2,BAP1,  BARD1, BLM, BMPR1A, BRCA1, BRCA2, BRIP1, CASR, CDC73, CDH1, CDK4, CDKN1B, CDKN1C, CDKN2A (p14ARF), CDKN2A (p16INK4a), CEBPA, CHEK2, CTNNA1, DICER1, DIS3L2, EGFR (c.2369C>T, p.Thr790Met variant only), EPCAM (Deletion/duplication testing only), FH, FLCN, GATA2, GPC3, GREM1 (Promoter region deletion/duplication testing only), HOXB13 (c.251G>A, p.Gly84Glu), HRAS, KIT, MAX, MEN1, MET, MITF (c.952G>A, p.Glu318Lys variant only), MLH1, MSH2, MSH3, MSH6, MUTYH, NBN, NF1, NF2, NTHL1, PALB2, PDGFRA, PHOX2B, PMS2, POLD1, POLE, POT1, PRKAR1A, PTCH1, PTEN, RAD50, RAD51C, RAD51D, RB1,  RECQL4, RET, RNF43, RUNX1, SDHAF2, SDHA (sequence changes only), SDHB, SDHC, SDHD, SMAD4, SMARCA4, SMARCB1, SMARCE1, STK11, SUFU, TERC, TERT, TMEM127, TP53, TSC1, TSC2, VHL, WRN and WT1.      FAMILY HISTORY:  We obtained a detailed, 4-generation family history.  Significant diagnoses are listed below: Family History  Problem Relation Age of Onset  . Pancreatic cancer Mother 28  . Basal cell carcinoma Father        dx after 50, sun exposure  . Ovarian cancer Paternal Aunt        dx 96s  . Ovarian cancer Paternal Grandmother        d. [name] 46s  . Colon cancer Paternal Grandfather        dx [name] 16s  . Liver cancer Maternal Uncle        dx 52s  . Prostate cancer Maternal Uncle        dx >50  . Leukemia Cousin 90       maternal cousin      Kelly [name] has two living daughters, ages 40 an 49. Kelly [name] has three brothers and one sister, none of whom have had cancer.    Kelly [name] mother passed away at age 21 and had a history of diabetes in her 91s.  Kelly [name] had a maternal uncle with liver cancer in his 29s and had a maternal uncle with prostate cancer after the age of 85.  Kelly [name] maternal cousin had leukemia diagnosed at age 83.  Kelly [name] maternal grandfather had colon cancer diagnosed in his [name] 61s.  No other maternal family history of cancer was reported.   Kelly [name] father passed away at age 75 and had a history of basal cell carcinoma.  Kelly [name] paternal grandmother (died [name] 2s) and paternal aunt (diagnosed 66s) both had a history of ovarian  cancer.  No other paternal family history of cancer was reported.   Kelly [name] is unaware of previous family history of genetic testing for hereditary cancer risks. Patient's ancestors are of White/Caucasian descent. There is no reported Ashkenazi Jewish ancestry. There is no known consanguinity.  GENETIC TEST RESULTS: Genetic testing reported out on April 24, 2020.  The [facility] Multi-Cancer Panel found  no pathogenic mutations. The Multi-Cancer Panel offered by [facility] includes sequencing and/or deletion duplication testing of the following 85 genes: AIP, ALK, APC, ATM, AXIN2,BAP1,  BARD1, BLM, BMPR1A, BRCA1, BRCA2, BRIP1, CASR, CDC73, CDH1, CDK4, CDKN1B, CDKN1C, CDKN2A (p14ARF), CDKN2A (p16INK4a), CEBPA, CHEK2, CTNNA1, DICER1, DIS3L2, EGFR (c.2369C>T, p.Thr790Met variant only), EPCAM (Deletion/duplication testing only), FH, FLCN, GATA2, GPC3, GREM1 (Promoter region deletion/duplication testing only), HOXB13 (c.251G>A, p.Gly84Glu), HRAS, KIT, MAX, MEN1, MET, MITF (c.952G>A, p.Glu318Lys variant only), MLH1, MSH2, MSH3, MSH6, MUTYH, NBN, NF1, NF2, NTHL1, PALB2, PDGFRA, PHOX2B, PMS2, POLD1, POLE, POT1, PRKAR1A, PTCH1, PTEN, RAD50, RAD51C, RAD51D, RB1, RECQL4, RET, RNF43, RUNX1, SDHAF2, SDHA (sequence changes only), SDHB, SDHC, SDHD, SMAD4, SMARCA4, SMARCB1, SMARCE1, STK11, SUFU, TERC, TERT, TMEM127, TP53, TSC1, TSC2, VHL, WRN and WT1.   The test report has been scanned into EPIC and is located under the Molecular Pathology section of the Results Review tab.  A portion of the result report is included below for reference.     We discussed with Kelly [name] that because current genetic testing is not perfect, it is possible there may be a gene mutation in one of these genes that current testing cannot detect, but that chance is small.  We also discussed, that there could be another gene that has not yet been discovered, or that we have not yet tested, that is responsible for the cancer diagnoses in the family. It is also possible there is a hereditary cause for the cancer in the family that Kelly [name] did not inherit and therefore was not identified in her testing.  Therefore, it is important to remain in touch with cancer genetics in the future so that we can continue to offer Kelly [name] the most up to date genetic testing.    ADDITIONAL GENETIC TESTING: We discussed with Kelly [name] that her genetic testing was  fairly extensive.  If there are genes identified to increase cancer risk that can be analyzed in the future, we would be happy to discuss and coordinate this testing at that time.    CANCER SCREENING RECOMMENDATIONS: Kelly [name] test result is considered negative (normal).  This means that we have not identified a hereditary cause for her personal and family history of cancer at this time. Most cancers happen by chance and this negative test suggests that her cancer may fall into this category.    While reassuring, this does not definitively rule out a hereditary predisposition to cancer. It is still possible that there could be genetic mutations that are undetectable by current technology. There could be genetic mutations in genes that have not been tested or identified to increase cancer risk.  Therefore, it is recommended she continue to follow the cancer management and screening guidelines provided by her oncology and primary healthcare provider.   An individual's cancer risk and medical management are not determined by genetic test results alone. Overall cancer risk assessment incorporates additional factors, including personal medical history, family history, and any available genetic information that may result in a personalized plan for cancer prevention and surveillance  RECOMMENDATIONS FOR FAMILY MEMBERS:  Individuals in this family might be at  some increased risk of developing cancer, over the general population risk, simply due to the family history of cancer.  We recommended women in this family have a yearly mammogram beginning at age 46, or 55 years younger than the earliest onset of cancer, an annual clinical breast exam, and perform monthly breast self-exams. Women in this family should also have a gynecological exam as recommended by their primary provider. All family members should be referred for colonoscopy starting at age 19.  It is also possible there is a hereditary cause for the  cancer in Kelly [name]'s family that she did not inherit and therefore was not identified in her.  Based on Kelly [name]'s family history of pancreatic and ovarian cancer, we recommended her siblings have genetic counseling and testing. Ms. Harroun will let us know if we can be of any assistance in coordinating genetic counseling and/or testing for this family member.   FOLLOW-UP: Lastly, we discussed with Ms. Garry that cancer genetics is a rapidly advancing field and it is possible that new genetic tests will be appropriate for her and/or her family members in the future. We encouraged her to remain in contact with cancer genetics on an annual basis so we can update her personal and family histories and let her know of advances in cancer genetics that may benefit this family.   Our contact number was provided. Ms. Bullis questions were answered to her satisfaction, and she knows she is welcome to call us at anytime with additional questions or concerns.    Exander Shaul M. Joette Catching, [place], [facility] Genetic Counselor [email or web address] (P) [phone]

## 2020-04-26 NOTE — Telephone Encounter (Addendum)
Revealed negative genetic testing.  Discussed that we do not know why she has breast cancer or why there is cancer in the family. It could be sporadic/familial, due to a different gene that we are not testing, or maybe our current technology may not be able to pick something up.  It will be important for her to keep in contact with genetics to keep up with whether additional testing may be needed.   

## 2020-04-26 NOTE — H&P (Signed)
 Kelly [name] Appointment: 04/14/2020 1:00 PM Location: [facility] Patient #: [id number] DOB: 09/04/1940 Undefined / Language: English / Race: White Female   History of Present Illness ( A.  MD; 04/14/2020 3:22 PM) The patient is a 79 year old female who presents with breast cancer. Chief complaint: Right breast cancer  This is a 79-year-old female who was found on recent screening mammography to have a small mass in the right breast. It measured 0.5 cm on mammogram but 0.9 cm in ultrasound. She underwent a biopsy of this showing an invasive ductal carcinoma. It was 90% ER positive, 75% PR positive, HER-2 negative, and a Ki-67 of 5%. Ultrasound showed no enlarged lymph nodes. She has no previous history of breast cancer. She does have a strong family history of cancer. She is currently doing well and is otherwise without complaints. She has no cardiac issues. She does have sleep apnea but uses her CPAP.    Past Surgical History (Wendy Smith, RN; 04/14/2020 8:01 AM) Appendectomy  Cesarean Section - 1  Foot Surgery  Bilateral. Tonsillectomy   Diagnostic Studies History (Wendy Smith, RN; 04/14/2020 8:01 AM) Colonoscopy  5-10 years ago Mammogram  within last year Pap Smear  1-5 years ago  Medication History (Wendy Smith, RN; 04/14/2020 8:00 AM) Medications Reconciled  Social History (Wendy Smith, RN; 04/14/2020 8:01 AM) Alcohol use  Occasional alcohol use. Caffeine use  Coffee, Tea. No drug use  Tobacco use  Never smoker.  Family History (Wendy Smith, RN; 04/14/2020 8:01 AM) Arthritis  Father. Diabetes Mellitus  Mother. Hypertension  Mother. Malignant Neoplasm Of Pancreas  Mother. Migraine Headache  Father.  Pregnancy / Birth History (Wendy Smith, RN; 04/14/2020 8:01 AM) Age at menarche  15 years. Age of menopause  46-50 Gravida  3 Length (months) of breastfeeding  3-6 Maternal age  21-25 Para  3 Regular periods    Other Problems (Wendy Smith, RN; 04/14/2020 8:01 AM) Arthritis  Back Pain  Bladder Problems  Breast Cancer  Hypercholesterolemia  Lump In Breast  Sleep Apnea  Transfusion history     Review of Systems (Wendy Smith RN; 04/14/2020 8:01 AM) General Present- Weight Gain. Not Present- Appetite Loss, Chills, Fatigue, Fever, Night Sweats and Weight Loss. Skin Not Present- Change in Wart/Mole, Dryness, Hives, Jaundice, New Lesions, Non-Healing Wounds, Rash and Ulcer. HEENT Present- Hearing Loss, Seasonal Allergies and Wears glasses/contact lenses. Not Present- Earache, Hoarseness, Nose Bleed, Oral Ulcers, Ringing in the Ears, Sinus Pain, Sore Throat, Visual Disturbances and Yellow Eyes. Respiratory Present- Wheezing. Not Present- Bloody sputum, Chronic Cough, Difficulty Breathing and Snoring. Breast Present- Breast Mass. Not Present- Breast Pain, Nipple Discharge and Skin Changes. Cardiovascular Present- Difficulty Breathing Lying Down. Not Present- Chest Pain, Leg Cramps, Palpitations, Rapid Heart Rate, Shortness of Breath and Swelling of Extremities. Gastrointestinal Not Present- Abdominal Pain, Bloating, Bloody Stool, Change in Bowel Habits, Chronic diarrhea, Constipation, Difficulty Swallowing, Excessive gas, Gets full quickly at meals, Hemorrhoids, Indigestion, Nausea, Rectal Pain and Vomiting. Female Genitourinary Present- Frequency. Not Present- Nocturia, Painful Urination, Pelvic Pain and Urgency. Musculoskeletal Present- Back Pain. Not Present- Joint Pain, Joint Stiffness, Muscle Pain, Muscle Weakness and Swelling of Extremities. Neurological Not Present- Decreased Memory, Fainting, Headaches, Numbness, Seizures, Tingling, Tremor, Trouble walking and Weakness. Psychiatric Not Present- Anxiety, Bipolar, Change in Sleep Pattern, Depression, Fearful and Frequent crying. Endocrine Not Present- Cold Intolerance, Excessive Hunger, Hair Changes, Heat Intolerance, Hot flashes and New  Diabetes. Hematology Not Present- Blood Thinners, Easy Bruising, Excessive bleeding, Gland problems, HIV and   Persistent Infections.   Physical Exam ( A.  MD; 04/14/2020 3:22 PM) The physical exam findings are as follows: Note: She appears well on exam  There is minimal ecchymosis of the right breast from the biopsy. I cannot palpate a mass. There is no axillary adenopathy.    Assessment & Plan ( A.  MD; 04/14/2020 3:24 PM) INVASIVE DUCTAL CARCINOMA OF BREAST, RIGHT (C50.911) Impression: I have reviewed her notes in the electronic medical records. I reviewed her mammogram, ultrasound, and pathology results. We also discussed her today in our multidisciplinary breast cancer conference. She has an invasive ductal carcinoma of the right breast which is ER/PR positive with a Ki-67 of only 5%. I discussed the diagnosis with her and her brother. We then discussed breast conservation with a lumpectomy and possible radiation therapy versus mastectomy. This is a very small cancer so breast conservation is recommended and she would like to proceed this way. I then discussed proceeding with a radioactive seed guided right breast lumpectomy. Given recent studies, we will hold on any lymph node biopsy. I discussed the surgical procedure in detail. I discussed the risk which includes but is not limited to bleeding, infection, injury to surrounding structures, the need for further surgery if the margins are positive, cardiopulmonary issues, postoperative recovery, etc. She understands and wishes to proceed with surgery.     

## 2020-04-27 ENCOUNTER — Encounter (HOSPITAL_COMMUNITY): Payer: Self-pay | Admitting: Surgery

## 2020-04-27 ENCOUNTER — Encounter (HOSPITAL_COMMUNITY)
Admission: RE | Disposition: A | Discharge status: Home / Self Care | Payer: Self-pay | Source: Home / Self Care | Attending: Surgery

## 2020-04-27 ENCOUNTER — Ambulatory Visit (HOSPITAL_COMMUNITY)
Admission: RE | Admit: 2020-04-27 | Discharge: 2020-04-27 | Disposition: A | Discharge status: Home / Self Care | Payer: Medicare HMO | Attending: Surgery | Admitting: Surgery

## 2020-04-27 ENCOUNTER — Other Ambulatory Visit: Payer: Self-pay

## 2020-04-27 ENCOUNTER — Ambulatory Visit (HOSPITAL_COMMUNITY): Payer: Medicare HMO | Admitting: Anesthesiology

## 2020-04-27 ENCOUNTER — Ambulatory Visit (HOSPITAL_COMMUNITY): Payer: Medicare HMO | Admitting: Vascular Surgery

## 2020-04-27 DIAGNOSIS — Z170 Estrogen receptor positive status [ER+]: Secondary | ICD-10-CM | POA: Diagnosis not present

## 2020-04-27 DIAGNOSIS — Z800 Family history of malignant neoplasm of digestive organs: Secondary | ICD-10-CM | POA: Diagnosis not present

## 2020-04-27 DIAGNOSIS — C50411 Malignant neoplasm of upper-outer quadrant of right female breast: Secondary | ICD-10-CM | POA: Diagnosis not present

## 2020-04-27 DIAGNOSIS — Z820 Family history of epilepsy and other diseases of the nervous system: Secondary | ICD-10-CM | POA: Insufficient documentation

## 2020-04-27 DIAGNOSIS — Z9989 Dependence on other enabling machines and devices: Secondary | ICD-10-CM | POA: Diagnosis not present

## 2020-04-27 DIAGNOSIS — Z803 Family history of malignant neoplasm of breast: Secondary | ICD-10-CM | POA: Diagnosis not present

## 2020-04-27 DIAGNOSIS — Z8261 Family history of arthritis: Secondary | ICD-10-CM | POA: Insufficient documentation

## 2020-04-27 DIAGNOSIS — Z833 Family history of diabetes mellitus: Secondary | ICD-10-CM | POA: Insufficient documentation

## 2020-04-27 DIAGNOSIS — G4733 Obstructive sleep apnea (adult) (pediatric): Secondary | ICD-10-CM | POA: Diagnosis not present

## 2020-04-27 DIAGNOSIS — N6489 Other specified disorders of breast: Secondary | ICD-10-CM | POA: Diagnosis not present

## 2020-04-27 DIAGNOSIS — Z20822 Contact with and (suspected) exposure to covid-19: Secondary | ICD-10-CM | POA: Insufficient documentation

## 2020-04-27 DIAGNOSIS — G4730 Sleep apnea, unspecified: Secondary | ICD-10-CM | POA: Diagnosis not present

## 2020-04-27 DIAGNOSIS — C50911 Malignant neoplasm of unspecified site of right female breast: Secondary | ICD-10-CM | POA: Diagnosis not present

## 2020-04-27 DIAGNOSIS — Z8249 Family history of ischemic heart disease and other diseases of the circulatory system: Secondary | ICD-10-CM | POA: Insufficient documentation

## 2020-04-27 DIAGNOSIS — R928 Other abnormal and inconclusive findings on diagnostic imaging of breast: Secondary | ICD-10-CM | POA: Diagnosis not present

## 2020-04-27 DIAGNOSIS — N6011 Diffuse cystic mastopathy of right breast: Secondary | ICD-10-CM | POA: Diagnosis not present

## 2020-04-27 HISTORY — PX: BREAST LUMPECTOMY WITH RADIOACTIVE SEED LOCALIZATION: SHX6424

## 2020-04-27 LAB — SARS CORONAVIRUS 2 BY RT PCR (HOSPITAL ORDER, PERFORMED IN ~~LOC~~ HOSPITAL LAB): SARS Coronavirus 2: NEGATIVE

## 2020-04-27 SURGERY — BREAST LUMPECTOMY WITH RADIOACTIVE SEED LOCALIZATION
Anesthesia: General | Site: Breast | Laterality: Right

## 2020-04-27 MED ORDER — BUPIVACAINE HCL (PF) 0.25 % IJ SOLN
INTRAMUSCULAR | Status: AC
  Filled 2020-04-27: qty 30

## 2020-04-27 MED ORDER — FENTANYL CITRATE (PF) 250 MCG/5ML IJ SOLN
INTRAMUSCULAR | Status: DC | PRN
  Administered 2020-04-27: 50 ug via INTRAVENOUS

## 2020-04-27 MED ORDER — PHENYLEPHRINE HCL-NACL 10-0.9 MG/250ML-% IV SOLN
INTRAVENOUS | Status: DC | PRN
  Administered 2020-04-27: 40 ug/min via INTRAVENOUS

## 2020-04-27 MED ORDER — ORAL CARE MOUTH RINSE: 15.0000 mL | Freq: Once | OROMUCOSAL | Status: AC

## 2020-04-27 MED ORDER — FENTANYL CITRATE (PF) 100 MCG/2ML IJ SOLN
INTRAMUSCULAR | Status: AC
  Filled 2020-04-27: qty 2

## 2020-04-27 MED ORDER — LACTATED RINGERS IV SOLN: INTRAVENOUS | Status: DC

## 2020-04-27 MED ORDER — DEXAMETHASONE SODIUM PHOSPHATE 10 MG/ML IJ SOLN
INTRAMUSCULAR | Status: AC
  Filled 2020-04-27: qty 1

## 2020-04-27 MED ORDER — LIDOCAINE 2% (20 MG/ML) 5 ML SYRINGE
INTRAMUSCULAR | Status: DC | PRN
  Administered 2020-04-27: 80 mg via INTRAVENOUS

## 2020-04-27 MED ORDER — PROPOFOL 10 MG/ML IV BOLUS
INTRAVENOUS | Status: DC | PRN
  Administered 2020-04-27: 150 mg via INTRAVENOUS

## 2020-04-27 MED ORDER — CHLORHEXIDINE GLUCONATE CLOTH 2 % EX PADS: 6.0000 | MEDICATED_PAD | Freq: Once | CUTANEOUS | Status: DC

## 2020-04-27 MED ORDER — FENTANYL CITRATE (PF) 250 MCG/5ML IJ SOLN
INTRAMUSCULAR | Status: AC
  Filled 2020-04-27: qty 5

## 2020-04-27 MED ORDER — FENTANYL CITRATE (PF) 100 MCG/2ML IJ SOLN
25.0000 ug | INTRAMUSCULAR | Status: DC | PRN
  Administered 2020-04-27: 50 ug via INTRAVENOUS

## 2020-04-27 MED ORDER — ONDANSETRON HCL 4 MG/2ML IJ SOLN
INTRAMUSCULAR | Status: DC | PRN
  Administered 2020-04-27: 4 mg via INTRAVENOUS

## 2020-04-27 MED ORDER — ONDANSETRON HCL 4 MG/2ML IJ SOLN: 4.0000 mg | Freq: Once | INTRAMUSCULAR | Status: DC | PRN

## 2020-04-27 MED ORDER — LIDOCAINE 2% (20 MG/ML) 5 ML SYRINGE
INTRAMUSCULAR | Status: AC
  Filled 2020-04-27: qty 5

## 2020-04-27 MED ORDER — CIPROFLOXACIN IN D5W 400 MG/200ML IV SOLN
400.0000 mg | INTRAVENOUS | Status: AC
  Administered 2020-04-27: 400 mg via INTRAVENOUS
  Filled 2020-04-27: qty 200

## 2020-04-27 MED ORDER — PROPOFOL 10 MG/ML IV BOLUS
INTRAVENOUS | Status: AC
  Filled 2020-04-27: qty 20

## 2020-04-27 MED ORDER — TRAMADOL HCL 50 MG PO TABS: 50.0000 mg | ORAL_TABLET | Freq: Four times a day (QID) | ORAL | 0 refills | Status: DC | PRN

## 2020-04-27 MED ORDER — DEXAMETHASONE SODIUM PHOSPHATE 10 MG/ML IJ SOLN
INTRAMUSCULAR | Status: DC | PRN
  Administered 2020-04-27: 10 mg via INTRAVENOUS

## 2020-04-27 MED ORDER — CHLORHEXIDINE GLUCONATE 0.12 % MT SOLN
15.0000 mL | Freq: Once | OROMUCOSAL | Status: AC
  Administered 2020-04-27: 15 mL via OROMUCOSAL
  Filled 2020-04-27: qty 15

## 2020-04-27 MED ORDER — BUPIVACAINE HCL (PF) 0.25 % IJ SOLN
INTRAMUSCULAR | Status: DC | PRN
  Administered 2020-04-27: 15 mL

## 2020-04-27 MED ORDER — 0.9 % SODIUM CHLORIDE (POUR BTL) OPTIME
TOPICAL | Status: DC | PRN
  Administered 2020-04-27: 1000 mL

## 2020-04-27 MED ORDER — ACETAMINOPHEN 500 MG PO TABS
1000.0000 mg | ORAL_TABLET | ORAL | Status: AC
  Administered 2020-04-27: 1000 mg via ORAL
  Filled 2020-04-27: qty 2

## 2020-04-27 MED ORDER — ENSURE PRE-SURGERY PO LIQD: 296.0000 mL | Freq: Once | ORAL | Status: DC

## 2020-04-27 MED ORDER — GABAPENTIN 300 MG PO CAPS
300.0000 mg | ORAL_CAPSULE | ORAL | Status: AC
  Administered 2020-04-27: 300 mg via ORAL
  Filled 2020-04-27: qty 1

## 2020-04-27 MED ORDER — ONDANSETRON HCL 4 MG/2ML IJ SOLN
INTRAMUSCULAR | Status: AC
  Filled 2020-04-27: qty 2

## 2020-04-27 SURGICAL SUPPLY — 37 items
ADH SKN CLS APL DERMABOND .7 (GAUZE/BANDAGES/DRESSINGS) ×1
APL PRP STRL LF DISP 70% ISPRP (MISCELLANEOUS) ×1
APPLIER CLIP 9.375 MED OPEN (MISCELLANEOUS) ×3
APR CLP MED 9.3 20 MLT OPN (MISCELLANEOUS) ×1
BINDER BREAST LRG (GAUZE/BANDAGES/DRESSINGS) IMPLANT
BINDER BREAST XLRG (GAUZE/BANDAGES/DRESSINGS) ×2 IMPLANT
CANISTER SUCT 3000ML PPV (MISCELLANEOUS) ×3 IMPLANT
CHLORAPREP W/TINT 26 (MISCELLANEOUS) ×3 IMPLANT
CLIP APPLIE 9.375 MED OPEN (MISCELLANEOUS) ×1 IMPLANT
COVER PROBE W GEL 5X96 (DRAPES) ×3 IMPLANT
COVER SURGICAL LIGHT HANDLE (MISCELLANEOUS) ×3 IMPLANT
COVER WAND RF STERILE (DRAPES) ×3 IMPLANT
DERMABOND ADVANCED (GAUZE/BANDAGES/DRESSINGS) ×2
DERMABOND ADVANCED .7 DNX12 (GAUZE/BANDAGES/DRESSINGS) ×1 IMPLANT
DEVICE DUBIN SPECIMEN MAMMOGRA (MISCELLANEOUS) ×3 IMPLANT
DRAPE CHEST BREAST 15X10 FENES (DRAPES) ×3 IMPLANT
ELECT CAUTERY BLADE 6.4 (BLADE) ×3 IMPLANT
ELECT REM PT RETURN 9FT ADLT (ELECTROSURGICAL) ×3
ELECTRODE REM PT RTRN 9FT ADLT (ELECTROSURGICAL) ×1 IMPLANT
GLOVE SURG SIGNA 7.5 PF LTX (GLOVE) ×3 IMPLANT
GOWN STRL REUS W/ TWL LRG LVL3 (GOWN DISPOSABLE) ×1 IMPLANT
GOWN STRL REUS W/ TWL XL LVL3 (GOWN DISPOSABLE) ×1 IMPLANT
GOWN STRL REUS W/TWL LRG LVL3 (GOWN DISPOSABLE) ×3
GOWN STRL REUS W/TWL XL LVL3 (GOWN DISPOSABLE) ×3
KIT BASIN OR (CUSTOM PROCEDURE TRAY) ×3 IMPLANT
KIT MARKER MARGIN INK (KITS) ×3 IMPLANT
NDL HYPO 25GX1X1/2 BEV (NEEDLE) ×1 IMPLANT
NEEDLE HYPO 25GX1X1/2 BEV (NEEDLE) ×3 IMPLANT
NS IRRIG 1000ML POUR BTL (IV SOLUTION) IMPLANT
PACK GENERAL/GYN (CUSTOM PROCEDURE TRAY) ×3 IMPLANT
PAD ABD 8X10 STRL (GAUZE/BANDAGES/DRESSINGS) ×2 IMPLANT
PENCIL SMOKE EVACUATOR (MISCELLANEOUS) ×4 IMPLANT
SUT MNCRL AB 4-0 PS2 18 (SUTURE) ×3 IMPLANT
SUT VIC AB 3-0 SH 18 (SUTURE) ×3 IMPLANT
SYR CONTROL 10ML LL (SYRINGE) ×3 IMPLANT
TOWEL GREEN STERILE (TOWEL DISPOSABLE) ×3 IMPLANT
TOWEL GREEN STERILE FF (TOWEL DISPOSABLE) ×3 IMPLANT

## 2020-04-27 NOTE — Anesthesia Procedure Notes (Signed)
Procedure Name: LMA Insertion Date/Time: 04/27/2020 9:21 AM Performed by: Kyung Rudd, CRNA Pre-anesthesia Checklist: Patient identified, Emergency Drugs available, Suction available and Patient being monitored Patient Re-evaluated:Patient Re-evaluated prior to induction Oxygen Delivery Method: Circle system utilized Preoxygenation: Pre-oxygenation with 100% oxygen Induction Type: IV induction LMA: LMA inserted LMA Size: 4.0 Number of attempts: 1 Placement Confirmation: positive ETCO2 and breath sounds checked- equal and bilateral Tube secured with: Tape Dental Injury: Teeth and Oropharynx as per pre-operative assessment

## 2020-04-27 NOTE — Anesthesia Postprocedure Evaluation (Signed)
Anesthesia Post Note  Patient: Kelly [name]  Procedure(s) Performed: RIGHT BREAST LUMPECTOMY WITH RADIOACTIVE SEED LOCALIZATION (Right Breast)     Patient location during evaluation: PACU Anesthesia Type: General Level of consciousness: awake and alert, awake and oriented Pain management: pain level controlled Vital Signs Assessment: post-procedure vital signs reviewed and stable Respiratory status: spontaneous breathing, nonlabored ventilation and respiratory function stable Cardiovascular status: blood pressure returned to baseline and stable Postop Assessment: no apparent nausea or vomiting Anesthetic complications: no   No complications documented.  Last Vitals:  Vitals:   04/27/20 1025 04/27/20 1027  BP: 106/71   Pulse: (!) 56 (!) 58  Resp: 12 12  Temp:  36.4 C  SpO2: 97% 96%    Last Pain:  Vitals:   04/27/20 1015  PainSc: 4                  Catalina Gravel

## 2020-04-27 NOTE — Interval H&P Note (Signed)
History and Physical Interval Note: no change in H and P  04/27/2020 7:05 AM  Kelly [name]  has presented today for surgery, with the diagnosis of RIGHT BREAST CANCER.  The various methods of treatment have been discussed with the patient and family. After consideration of risks, benefits and other options for treatment, the patient has consented to  Procedure(s): RIGHT BREAST LUMPECTOMY WITH RADIOACTIVE SEED LOCALIZATION (Right) as a surgical intervention.  The patient's history has been reviewed, patient examined, no change in status, stable for surgery.  I have reviewed the patient's chart and labs.  Questions were answered to the patient's satisfaction.     [name]

## 2020-04-27 NOTE — Op Note (Signed)
RIGHT BREAST LUMPECTOMY WITH RADIOACTIVE SEED LOCALIZATION  Procedure Note  Kelly [name] 04/27/2020   Pre-op Diagnosis: RIGHT BREAST CANCER     Post-op Diagnosis: same  Procedure(s): RIGHT BREAST LUMPECTOMY WITH RADIOACTIVE SEED LOCALIZATION  Surgeon(s): [name], MD  Anesthesia: General  Staff:  Circulator: Cyd Silence, RN Relief Circulator: Rosanne Sack, RN Relief Scrub: Teschner, Mindy K, CST Scrub Person: Lois Huxley; Opyd, Nolene Ebbs, RN  Estimated Blood Loss: Minimal               Specimens: sent to path  Indications: This is a 79 year old female recently diagnosed with an invasive ductal carcinoma of the right breast which was ER/PR positive.  The decision was made to proceed with a radioactive seed guided lumpectomy.  Procedure: The patient was brought to operating identifies correct patient.  She is placed upon the operating table general esthesia was induced.  Her right breast was prepped and draped in usual sterile fashion.  The radioactive seed was located at approximately 10 o'clock position 4 cm from the nipple.  I anesthetized the lateral edge of the areola with Marcaine and then made a circumareolar incision with a scalpel.  I then dissected down to the breast tissue with electrocautery and then moved laterally.  With the aid of the neoprobe I then stayed around the radioactive seed performing a lumpectomy.  Once the specimen was removed I marked all margins with paint.  An x-ray was performed on the specimen confirming the radioactive seed and tissue marker in the specimen.  Given the location of the seed and clip I decided to take further superior and lateral margins which were taken together, painted, and sent to pathology for further evaluation.  Hemostasis was then achieved with the cautery.  Surgical clips were then placed around the periphery of the lumpectomy cavity.  I then closed subtenons tissue with interrupted 3-0 Vicryl sutures and  closed the skin with a running 4-0 Monocryl.  Dermabond was then applied.  The patient was then placed in a breast binder.  The patient tolerated the procedure well.  All the counts were correct at the end of the procedure.  The patient was then extubated in the operating room and taken in a stable addition to the recovery room.          [name]   Date: 04/27/2020  Time: 10:08 AM

## 2020-04-27 NOTE — Discharge Instructions (Addendum)
[place] Office Phone Number [phone]  BREAST BIOPSY/ PARTIAL MASTECTOMY: POST OP INSTRUCTIONS  Always review your discharge instruction sheet given to you by the facility where your surgery was performed.  IF YOU HAVE DISABILITY OR FAMILY LEAVE FORMS, YOU MUST BRING THEM TO THE OFFICE FOR PROCESSING.  DO NOT GIVE THEM TO YOUR DOCTOR.  1. A prescription for pain medication may be given to you upon discharge.  Take your pain medication as prescribed, if needed.  If narcotic pain medicine is not needed, then you may take acetaminophen (Tylenol) or ibuprofen (Advil) as needed. 2. Take your usually prescribed medications unless otherwise directed 3. If you need a refill on your pain medication, please contact your pharmacy.  They will contact our office to request authorization.  Prescriptions will not be filled after 5pm or on week-ends. 4. You should eat very light the first 24 hours after surgery, such as soup, crackers, pudding, etc.  Resume your normal diet the day after surgery. 5. Most patients will experience some swelling and bruising in the breast.  Ice packs and a good support bra will help.  Swelling and bruising can take several days to resolve.  6. It is common to experience some constipation if taking pain medication after surgery.  Increasing fluid intake and taking a stool softener will usually help or prevent this problem from occurring.  A mild laxative (Milk of Magnesia or Miralax) should be taken according to package directions if there are no bowel movements after 48 hours. 7. Unless discharge instructions indicate otherwise, you may remove your bandages 24-48 hours after surgery, and you may shower at that time.  You may have steri-strips (small skin tapes) in place directly over the incision.  These strips should be left on the skin for 7-10 days.  If your surgeon used skin glue on the incision, you may shower in 24 hours.  The glue will flake off over the  next 2-3 weeks.  Any sutures or staples will be removed at the office during your follow-up visit. 8. ACTIVITIES:  You may resume regular daily activities (gradually increasing) beginning the next day.  Wearing a good support bra or sports bra minimizes pain and swelling.  You may have sexual intercourse when it is comfortable. a. You may drive when you no longer are taking prescription pain medication, you can comfortably wear a seatbelt, and you can safely maneuver your car and apply brakes. b. RETURN TO WORK:  ______________________________________________________________________________________ 9. You should see your doctor in the office for a follow-up appointment approximately two weeks after your surgery.  Your doctor's nurse will typically make your follow-up appointment when she calls you with your pathology report.  Expect your pathology report 2-3 business days after your surgery.  You may call to check if you do not hear from [place] after three days. 10. OTHER INSTRUCTIONS:YOU MAY REMOVE THE BINDER AND START SHOWERING TOMORROW 11. ICE PACK, TYLENOL, AND IBUPROFEN ALSO FOR PAIN 12. NO VIGOROUS ACTIVITY FOR ONE WEEK _______________________________________________________________________________________________ _____________________________________________________________________________________________________________________________________ _____________________________________________________________________________________________________________________________________ _____________________________________________________________________________________________________________________________________  WHEN TO CALL YOUR DOCTOR: 1. Fever over 101.0 2. Nausea and/or vomiting. 3. Extreme swelling or bruising. 4. Continued bleeding from incision. 5. Increased pain, redness, or drainage from the incision.  The clinic staff is available to answer your questions during regular business hours.   Please don't hesitate to call and ask to speak to one of the nurses for clinical concerns.  If you have a medical emergency, go to the nearest emergency room or  call 911.  A surgeon from [facility] is always on call at the hospital.  For further questions, please visit [email or web address]

## 2020-04-27 NOTE — Transfer of Care (Signed)
Immediate Anesthesia Transfer of Care Note  Patient: Kelly [name]  Procedure(s) Performed: RIGHT BREAST LUMPECTOMY WITH RADIOACTIVE SEED LOCALIZATION (Right Breast)  Patient Location: PACU  Anesthesia Type:General  Level of Consciousness: awake, alert  and oriented  Airway & Oxygen Therapy: Patient Spontanous Breathing  Post-op Assessment: Report given to RN, Post -op Vital signs reviewed and stable and Patient moving all extremities X 4  Post vital signs: Reviewed and stable  Last Vitals:  Vitals Value Taken Time  BP 110/66 04/27/20 1013  Temp 36.4 C 04/27/20 1013  Pulse 59 04/27/20 1017  Resp 13 04/27/20 1017  SpO2 99 % 04/27/20 1017  Vitals shown include unvalidated device data.  Last Pain:  Vitals:   04/27/20 1013  PainSc: 2       Patients Stated Pain Goal: 3 (72/55/00 1642)  Complications: No complications documented.

## 2020-04-27 NOTE — Progress Notes (Signed)
Pt. Reported she was swabbed for Covid on Friday and Saturday evening she played bridge with 16 other people that were all vaccinated. She reported she called Dr. Trevor Mace office and someone told her that would be okay. Pt. Reported they did not wear masks. Pt. Was re swabbed this morning for covid, results pending. Denies symptoms.

## 2020-04-28 ENCOUNTER — Encounter (HOSPITAL_COMMUNITY): Payer: Self-pay | Admitting: Surgery

## 2020-04-29 LAB — SURGICAL PATHOLOGY

## 2020-05-03 ENCOUNTER — Encounter: Payer: Self-pay | Admitting: *Deleted

## 2020-05-11 DIAGNOSIS — G4733 Obstructive sleep apnea (adult) (pediatric): Secondary | ICD-10-CM | POA: Diagnosis not present

## 2020-05-18 DIAGNOSIS — C50011 Malignant neoplasm of nipple and areola, right female breast: Secondary | ICD-10-CM | POA: Diagnosis not present

## 2020-05-18 DIAGNOSIS — Z6832 Body mass index (BMI) 32.0-32.9, adult: Secondary | ICD-10-CM | POA: Diagnosis not present

## 2020-05-18 DIAGNOSIS — Z01419 Encounter for gynecological examination (general) (routine) without abnormal findings: Secondary | ICD-10-CM | POA: Diagnosis not present

## 2020-05-26 ENCOUNTER — Other Ambulatory Visit: Payer: Self-pay

## 2020-05-26 ENCOUNTER — Ambulatory Visit
Admission: RE | Admit: 2020-05-26 | Discharge: 2020-05-26 | Disposition: A | Discharge status: Home / Self Care | Payer: Medicare HMO | Source: Ambulatory Visit | Attending: Radiation Oncology | Admitting: Radiation Oncology

## 2020-05-26 ENCOUNTER — Encounter: Payer: Self-pay | Admitting: Radiation Oncology

## 2020-05-26 VITALS — BP 123/73 | HR 78 | Temp 96.8°F | Resp 18 | Ht 67.0 in | Wt 206.5 lb

## 2020-05-26 DIAGNOSIS — Z803 Family history of malignant neoplasm of breast: Secondary | ICD-10-CM | POA: Diagnosis not present

## 2020-05-26 DIAGNOSIS — Z79899 Other long term (current) drug therapy: Secondary | ICD-10-CM | POA: Diagnosis not present

## 2020-05-26 DIAGNOSIS — Z8041 Family history of malignant neoplasm of ovary: Secondary | ICD-10-CM | POA: Insufficient documentation

## 2020-05-26 DIAGNOSIS — E785 Hyperlipidemia, unspecified: Secondary | ICD-10-CM | POA: Insufficient documentation

## 2020-05-26 DIAGNOSIS — C50411 Malignant neoplasm of upper-outer quadrant of right female breast: Secondary | ICD-10-CM

## 2020-05-26 DIAGNOSIS — Z170 Estrogen receptor positive status [ER+]: Secondary | ICD-10-CM | POA: Diagnosis not present

## 2020-05-26 DIAGNOSIS — G4730 Sleep apnea, unspecified: Secondary | ICD-10-CM | POA: Diagnosis not present

## 2020-05-26 NOTE — Progress Notes (Signed)
Location of Breast Cancer: Right Breast Cancer  Patient was previously seen in [facility] 04/14/2020  Did patient present with symptoms (if so, please note symptoms) or was this found on screening mammography?: Routine Mammogram  Diagnostic Mammogram: Mass noted in right upper quadrant.  Measured at the 10 o'clock position spanning 9 mm.  Histology per Pathology Report: Right Breast Lumpectomy 04/27/2020  Receptor Status: ER(+), PR (+), Her2-neu (-), Ki-67(5%)    Past/Anticipated interventions by surgeon, if any: Dr. Ninfa Linden -Right breast lumpectomy with radioactive seed localization 04/27/2020  Past/Anticipated interventions by medical oncology, if any: Chemotherapy  Dr. Jana Hakim 06/14/2020   Lymphedema issues, if any:  No  Pain issues, if any:  no  SAFETY ISSUES:  Prior radiation? No  Pacemaker/ICD? No  Possible current pregnancy? Postmenopausal  Is the patient on methotrexate? No  Current Complaints / other details:      Cori Razor, RN 05/26/2020,8:00 AM

## 2020-05-26 NOTE — Progress Notes (Signed)
Radiation Oncology         [phone] ________________________________  Name: Kelly [name]        MRN: [id number]  Date of Service: 05/26/2020 DOB: 09-18-1940  [id number], Real Cons, MD  Magrinat, Virgie Dad, MD     REFERRING PHYSICIAN: Magrinat, Virgie Dad, MD   DIAGNOSIS: The encounter diagnosis was Malignant neoplasm of upper-outer quadrant of right breast in female, estrogen receptor positive ([place]).   HISTORY OF PRESENT ILLNESS: Kelly [name] is a 80 y.o. female originally seen in the multidisciplinary breast clinic for a new diagnosis of right breast cancer. The patient was noted to have screening detected asymmetry in the right breast.  Of note she has a family history of ovarian and  breast cancer.  On diagnostic imaging a mass was seen in the right upper quadrant, and subsequent ultrasound measured this at 10 o'clock position spanning 9 mm.  Her axilla was negative for adenopathy.  A biopsy on 03/31/2020 revealed a grade 2 invasive ductal carcinoma that was ER/PR positive, HER-2 was negative and Ki-67 was 5%.  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  Resection margins were negative for carcinoma up to 10 mm, no nodes were sampled.  Additional right superior and lateral margin excision was consistent with fibrocystic change. She's seen today to discuss options of adjuvant radiotherapy.    PREVIOUS RADIATION THERAPY: No   PAST MEDICAL HISTORY:  Past Medical History:  Diagnosis Date  . Allergy   . Blood transfusion without reported diagnosis   . Breast cancer ([place])   . Breast mass    fibocystic breast dx  . Cataract   . Family history of colon cancer 04/15/2020  . Family history of pancreatic cancer 04/15/2020  . Family history of prostate cancer 04/15/2020  . Hepatitis    CMV 1992  .  Hyperlipidemia   . Postmenopausal HRT (hormone replacement therapy)   . Scarlet fever as a teen  . Sleep apnea        PAST SURGICAL HISTORY: Past Surgical History:  Procedure Laterality Date  . APPENDECTOMY    . BREAST LUMPECTOMY WITH RADIOACTIVE SEED LOCALIZATION Right 04/27/2020   Procedure: RIGHT BREAST LUMPECTOMY WITH RADIOACTIVE SEED LOCALIZATION;  Surgeon: [name], MD;  Location: [facility];  Service: General;  Laterality: Right;  . BUNIONECTOMY    . caesarean section    . COLONOSCOPY    . CORRECTION HAMMER TOE    . FOOT TENDON SURGERY     left   . POLYPECTOMY    . TONSILLECTOMY    . TONSILLECTOMY       FAMILY HISTORY:  Family History  Problem Relation Age of Onset  . Pancreatic cancer Mother 45  . Hyperlipidemia Mother   . Hypertension Mother   . Polymyalgia rheumatica Mother   . Dementia Father   . Basal cell carcinoma Father        dx after 50, sun exposure  . Ovarian cancer Paternal Aunt        dx 61s  . Ovarian cancer Paternal Grandmother        d. early 78s  . Colon cancer Paternal Grandfather        dx early 53s  . Liver cancer Maternal Uncle        dx 17s  . Prostate cancer Maternal Uncle  dx >50  . Leukemia Cousin 13       maternal cousin      SOCIAL HISTORY:  reports that she has never smoked. She has never used smokeless tobacco. She reports current alcohol use. She reports that she does not use drugs.  The patient is single and lives in [place].    ALLERGIES: Amoxicillin and Codeine   MEDICATIONS:  Current Outpatient Medications  Medication Sig Dispense Refill  . atorvastatin (LIPITOR) 40 MG tablet Take 1 tablet (40 mg total) by mouth daily. (Patient taking differently: Take 40 mg by mouth at bedtime.) 90 tablet 2  . Calcium Citrate (CITRACAL PO) Take 2 tablets by mouth daily.    . cholecalciferol (VITAMIN D3) 25 MCG (1000 UNIT) tablet Take 1,000 Units by mouth daily.    . Coenzyme Q10 (COQ10) 200 MG CAPS Take 200 mg by  mouth daily.    Marland Kitchen gabapentin (NEURONTIN) 100 MG capsule Take 2 capsules (200 mg total) by mouth at bedtime. (Patient taking differently: Take 100 mg by mouth at bedtime.) 180 capsule 3  . Multiple Vitamins-Minerals (SPECTRAVITE) TABS Take 1 tablet by mouth daily.    . Omega-3 Fatty Acids (FISH OIL) 1200 MG CAPS Take 1,200 mg by mouth daily.    . progesterone (PROMETRIUM) 200 MG capsule Take 1 capsule (200 mg total) by mouth as directed. 12 days out of every 6 months. (Patient not taking: Reported on 05/26/2020)    . traMADol (ULTRAM) 50 MG tablet Take 1-2 tablets (50-100 mg total) by mouth every 6 (six) hours as needed. (Patient not taking: Reported on 05/26/2020) 20 tablet 0   No current facility-administered medications for this encounter.     REVIEW OF SYSTEMS: On review of systems, the patient reports that she is doing very well, she has been very pleased with her surgical results and her progress since surgery.  She is hoping to avoid radiation, but is open to hearing further discussion about it.    PHYSICAL EXAM:  Wt Readings from Last 3 Encounters:  05/26/20 206 lb 8 oz (93.7 kg)  04/27/20 208 lb 15.9 oz (94.8 kg)  04/20/20 209 lb 1.6 oz (94.8 kg)   Temp Readings from Last 3 Encounters:  05/26/20 (!) 96.8 F (36 C) (Temporal)  04/27/20 97.6 F (36.4 C)  04/20/20 97.7 F (36.5 C) (Oral)   BP Readings from Last 3 Encounters:  05/26/20 123/73  04/27/20 106/71  04/20/20 110/74   Pulse Readings from Last 3 Encounters:  05/26/20 78  04/27/20 (!) 58  04/20/20 90    In general this is a well appearing caucasian female in no acute distress. She's alert and oriented x4 and appropriate throughout the examination. Cardiopulmonary assessment is negative for acute distress and she exhibits normal effort. Bilateral breast exam is deferred.    ECOG = 0  0 - Asymptomatic (Fully active, able to carry on all predisease activities without restriction)  1 - Symptomatic but completely  ambulatory (Restricted in physically strenuous activity but ambulatory and able to carry out work of a light or sedentary nature. For example, light housework, office work)  2 - Symptomatic, <50% in bed during the day (Ambulatory and capable of all self care but unable to carry out any work activities. Up and about more than 50% of waking hours)  3 - Symptomatic, >50% in bed, but not bedbound (Capable of only limited self-care, confined to bed or chair 50% or more of waking hours)  4 - Bedbound (Completely disabled.  Cannot carry on any self-care. Totally confined to bed or chair)  5 - Death   Eustace Pen MM, Creech RH, Tormey DC, et al. [phone]). "Toxicity and response criteria of the [facility]". [place] Oncol. 5 (6): 649-55    LABORATORY DATA:  Lab Results  Component Value Date   WBC 7.6 04/14/2020   HGB 13.5 04/14/2020   HCT 40.8 04/14/2020   MCV 87.9 04/14/2020   PLT 360 04/14/2020   Lab Results  Component Value Date   NA 140 04/14/2020   K 3.8 04/14/2020   CL 106 04/14/2020   CO2 25 04/14/2020   Lab Results  Component Value Date   ALT 18 04/14/2020   AST 17 04/14/2020   ALKPHOS 55 04/14/2020   BILITOT 0.4 04/14/2020      RADIOGRAPHY: No results found.     IMPRESSION/PLAN: 1. 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 Dr. Lisbeth Renshaw discusses the pathology findings and reviews the nature of right breast disease.  We reviewed the rationale for radiotherapy as a standard of care for women who undergo breast conserving surgery, [employer] also discusses the scenarios for which patients who have very favorable conditions could avoid additional treatment with radiotherapy provided that they are committed to using antiestrogen therapy as a way to reduce risks of recurrence.  We did review the risks and benefits, short and long-term effects as well as delivery logistics of radiotherapy, and after considering her condition, how  favorable her cancer is, she is interested in foregoing radiation.  We work happy to tell her that we be able to answer questions at any time if she has concerns or if she changes her mind or wishes to revisit this topic in the future.  Otherwise she will plan to follow-up with Dr. Jana Hakim at the end of the month and consider starting antiestrogen medication in the near future.  We will cancel plans for  simulation that we had originally scheduled for  In a visit lasting 60 minutes, greater than 50% of the time was spent face to face with Dr. Lisbeth Renshaw and via WebEx remotely with myself reviewing her case, as well as in preparation of, discussing, and coordinating the patient's care.  The above documentation reflects my direct findings during this shared patient visit. Please see the separate note by Dr. Lisbeth Renshaw on this date for the remainder of the patient's plan of care.    Carola Rhine, PAC

## 2020-05-27 ENCOUNTER — Ambulatory Visit: Payer: Medicare HMO | Admitting: Radiation Oncology
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
## 2020-06-14 ENCOUNTER — Inpatient Hospital Stay: Payer: Medicare HMO | Attending: Oncology | Admitting: Oncology

## 2020-06-14 ENCOUNTER — Other Ambulatory Visit: Payer: Self-pay

## 2020-06-14 VITALS — BP 121/70 | HR 70 | Temp 97.7°F | Resp 18 | Ht 67.0 in | Wt 209.3 lb

## 2020-06-14 DIAGNOSIS — Z885 Allergy status to narcotic agent status: Secondary | ICD-10-CM | POA: Diagnosis not present

## 2020-06-14 DIAGNOSIS — Z806 Family history of leukemia: Secondary | ICD-10-CM | POA: Diagnosis not present

## 2020-06-14 DIAGNOSIS — Z9049 Acquired absence of other specified parts of digestive tract: Secondary | ICD-10-CM | POA: Insufficient documentation

## 2020-06-14 DIAGNOSIS — Z79811 Long term (current) use of aromatase inhibitors: Secondary | ICD-10-CM | POA: Diagnosis not present

## 2020-06-14 DIAGNOSIS — Z8041 Family history of malignant neoplasm of ovary: Secondary | ICD-10-CM | POA: Diagnosis not present

## 2020-06-14 DIAGNOSIS — Z800 Family history of malignant neoplasm of digestive organs: Secondary | ICD-10-CM | POA: Insufficient documentation

## 2020-06-14 DIAGNOSIS — E785 Hyperlipidemia, unspecified: Secondary | ICD-10-CM | POA: Diagnosis not present

## 2020-06-14 DIAGNOSIS — C50411 Malignant neoplasm of upper-outer quadrant of right female breast: Secondary | ICD-10-CM | POA: Diagnosis not present

## 2020-06-14 DIAGNOSIS — Z8042 Family history of malignant neoplasm of prostate: Secondary | ICD-10-CM | POA: Insufficient documentation

## 2020-06-14 DIAGNOSIS — Z8349 Family history of other endocrine, nutritional and metabolic diseases: Secondary | ICD-10-CM | POA: Diagnosis not present

## 2020-06-14 DIAGNOSIS — Z818 Family history of other mental and behavioral disorders: Secondary | ICD-10-CM | POA: Diagnosis not present

## 2020-06-14 DIAGNOSIS — Z170 Estrogen receptor positive status [ER+]: Secondary | ICD-10-CM | POA: Diagnosis not present

## 2020-06-14 DIAGNOSIS — Z8249 Family history of ischemic heart disease and other diseases of the circulatory system: Secondary | ICD-10-CM | POA: Diagnosis not present

## 2020-06-14 DIAGNOSIS — Z880 Allergy status to penicillin: Secondary | ICD-10-CM | POA: Insufficient documentation

## 2020-06-14 DIAGNOSIS — Z808 Family history of malignant neoplasm of other organs or systems: Secondary | ICD-10-CM | POA: Insufficient documentation

## 2020-06-14 DIAGNOSIS — Z79899 Other long term (current) drug therapy: Secondary | ICD-10-CM | POA: Diagnosis not present

## 2020-06-14 MED ORDER — ANASTROZOLE 1 MG PO TABS: 1.0000 mg | ORAL_TABLET | Freq: Every day | ORAL | 4 refills | Status: DC

## 2020-06-15 ENCOUNTER — Encounter: Payer: Self-pay | Admitting: *Deleted

## 2020-06-15 ENCOUNTER — Telehealth: Payer: Self-pay | Admitting: Oncology

## 2020-06-15 NOTE — Telephone Encounter (Signed)
Scheduled appts per 1/31 los. Pt confirmed appt date, time, and that it's a virtual visit.

## 2020-06-21 ENCOUNTER — Ambulatory Visit: Payer: Medicare HMO | Admitting: Physical Therapy

## 2020-06-22 ENCOUNTER — Other Ambulatory Visit: Payer: Self-pay

## 2020-06-22 ENCOUNTER — Encounter: Payer: Self-pay | Admitting: Physical Therapy

## 2020-06-22 ENCOUNTER — Ambulatory Visit: Payer: Medicare HMO | Attending: Surgery | Admitting: Physical Therapy

## 2020-06-22 DIAGNOSIS — M5441 Lumbago with sciatica, right side: Secondary | ICD-10-CM | POA: Diagnosis not present

## 2020-06-22 DIAGNOSIS — M542 Cervicalgia: Secondary | ICD-10-CM | POA: Diagnosis not present

## 2020-06-22 DIAGNOSIS — R600 Localized edema: Secondary | ICD-10-CM | POA: Insufficient documentation

## 2020-06-22 DIAGNOSIS — M25612 Stiffness of left shoulder, not elsewhere classified: Secondary | ICD-10-CM

## 2020-06-22 DIAGNOSIS — M25611 Stiffness of right shoulder, not elsewhere classified: Secondary | ICD-10-CM | POA: Diagnosis not present

## 2020-06-22 DIAGNOSIS — Z483 Aftercare following surgery for neoplasm: Secondary | ICD-10-CM | POA: Diagnosis not present

## 2020-06-22 NOTE — Patient Instructions (Signed)
Shoulder: Flexion (Supine)    With hands shoulder width apart, slowly lower dowel to floor behind head. Do not let elbows bend. Keep back flat. Hold _15-30___ seconds. Repeat _10___ times. Do _2___ sessions per day. CAUTION: Stretch slowly and gently.  Copyright  VHI. All rights reserved.  Shoulder: Abduction (Supine)    With right arm flat on floor, hold dowel in palm. Slowly move arm up to side of head by pushing with opposite arm. Do not let elbow bend. Hold _15-30___ seconds. Repeat __10__ times. Repeat on L side.  Do __2__ sessions per day. CAUTION: Stretch slowly and gently.  Copyright  VHI. All rights reserved.
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
## 2020-06-28 ENCOUNTER — Encounter: Payer: Self-pay | Admitting: Physical Therapy

## 2020-06-28 ENCOUNTER — Other Ambulatory Visit: Payer: Self-pay

## 2020-06-28 ENCOUNTER — Ambulatory Visit: Payer: Medicare HMO | Admitting: Physical Therapy

## 2020-06-28 DIAGNOSIS — M542 Cervicalgia: Secondary | ICD-10-CM | POA: Diagnosis not present

## 2020-06-28 DIAGNOSIS — M25612 Stiffness of left shoulder, not elsewhere classified: Secondary | ICD-10-CM | POA: Diagnosis not present

## 2020-06-28 DIAGNOSIS — M25611 Stiffness of right shoulder, not elsewhere classified: Secondary | ICD-10-CM | POA: Diagnosis not present

## 2020-06-28 DIAGNOSIS — Z483 Aftercare following surgery for neoplasm: Secondary | ICD-10-CM | POA: Diagnosis not present

## 2020-06-28 DIAGNOSIS — R600 Localized edema: Secondary | ICD-10-CM

## 2020-06-28 DIAGNOSIS — M5441 Lumbago with sciatica, right side: Secondary | ICD-10-CM | POA: Diagnosis not present
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
## 2020-06-29 ENCOUNTER — Encounter: Payer: Self-pay | Admitting: Family Medicine

## 2020-06-30 ENCOUNTER — Ambulatory Visit: Payer: Medicare HMO

## 2020-07-02 ENCOUNTER — Other Ambulatory Visit: Payer: Self-pay

## 2020-07-02 ENCOUNTER — Encounter: Payer: Self-pay | Admitting: Physical Therapy

## 2020-07-02 ENCOUNTER — Ambulatory Visit: Payer: Medicare HMO | Admitting: Physical Therapy

## 2020-07-02 DIAGNOSIS — M25611 Stiffness of right shoulder, not elsewhere classified: Secondary | ICD-10-CM

## 2020-07-02 DIAGNOSIS — M542 Cervicalgia: Secondary | ICD-10-CM | POA: Diagnosis not present

## 2020-07-02 DIAGNOSIS — Z483 Aftercare following surgery for neoplasm: Secondary | ICD-10-CM | POA: Diagnosis not present

## 2020-07-02 DIAGNOSIS — M25612 Stiffness of left shoulder, not elsewhere classified: Secondary | ICD-10-CM | POA: Diagnosis not present

## 2020-07-02 DIAGNOSIS — M5441 Lumbago with sciatica, right side: Secondary | ICD-10-CM | POA: Diagnosis not present

## 2020-07-02 DIAGNOSIS — R600 Localized edema: Secondary | ICD-10-CM | POA: Diagnosis not present
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
## 2020-07-02 NOTE — Patient Instructions (Signed)
First of all, check with your insurance company to see if provider is in [place] (for wigs and compression sleeves / gloves/gauntlets )  [place], [place] [address] [phone]  Will file some insurances --- call for appointment   [facility] (for mastectomy prosthetics and garments) [place], [place] [address] [phone] Will file some insurances --- call for appointment  [facility]  [address]  [place], [place] [address] [phone] Lower extremity garments  [facility] [place], [place]  [address] [place] [facility] (for compression garments, especilly for lower extremities) [address] [place], [place]  [address] [phone] Call for appointment    Jerrol Banana ,certified fitter [place]  [phone]  [employer] (for mastectomy supplies and garments) [place] [place],  [address] [phone]  Other Resources: National Lymphedema Network:  www.lymphnet.org www.[employer].com for patient articles and self manual lymph drainage information www.[email or web address] has informative articles.  [email or web address]

## 2020-07-05 ENCOUNTER — Encounter: Payer: Self-pay | Admitting: Physical Therapy

## 2020-07-05 ENCOUNTER — Other Ambulatory Visit: Payer: Self-pay

## 2020-07-05 ENCOUNTER — Ambulatory Visit: Payer: Medicare HMO | Admitting: Physical Therapy

## 2020-07-05 DIAGNOSIS — R600 Localized edema: Secondary | ICD-10-CM | POA: Diagnosis not present

## 2020-07-05 DIAGNOSIS — M5441 Lumbago with sciatica, right side: Secondary | ICD-10-CM | POA: Diagnosis not present

## 2020-07-05 DIAGNOSIS — M25611 Stiffness of right shoulder, not elsewhere classified: Secondary | ICD-10-CM

## 2020-07-05 DIAGNOSIS — M542 Cervicalgia: Secondary | ICD-10-CM | POA: Diagnosis not present

## 2020-07-05 DIAGNOSIS — M25612 Stiffness of left shoulder, not elsewhere classified: Secondary | ICD-10-CM | POA: Diagnosis not present

## 2020-07-05 DIAGNOSIS — Z483 Aftercare following surgery for neoplasm: Secondary | ICD-10-CM
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
## 2020-07-06 ENCOUNTER — Encounter: Payer: Self-pay | Admitting: Family Medicine

## 2020-07-06 ENCOUNTER — Ambulatory Visit (INDEPENDENT_AMBULATORY_CARE_PROVIDER_SITE_OTHER): Payer: Medicare HMO

## 2020-07-06 ENCOUNTER — Ambulatory Visit: Payer: Medicare HMO | Admitting: Family Medicine

## 2020-07-06 ENCOUNTER — Other Ambulatory Visit: Payer: Self-pay

## 2020-07-06 ENCOUNTER — Ambulatory Visit: Payer: Self-pay

## 2020-07-06 VITALS — BP 110/84 | HR 77 | Ht 67.0 in | Wt 209.6 lb

## 2020-07-06 DIAGNOSIS — M7061 Trochanteric bursitis, right hip: Secondary | ICD-10-CM | POA: Diagnosis not present

## 2020-07-06 DIAGNOSIS — M545 Low back pain, unspecified: Secondary | ICD-10-CM | POA: Diagnosis not present

## 2020-07-06 DIAGNOSIS — M7062 Trochanteric bursitis, left hip: Secondary | ICD-10-CM

## 2020-07-06 DIAGNOSIS — M25511 Pain in right shoulder: Secondary | ICD-10-CM | POA: Diagnosis not present

## 2020-07-06 DIAGNOSIS — M25551 Pain in right hip: Secondary | ICD-10-CM | POA: Diagnosis not present

## 2020-07-06 DIAGNOSIS — M25552 Pain in left hip: Secondary | ICD-10-CM | POA: Diagnosis not present

## 2020-07-06 NOTE — Progress Notes (Signed)
I, [name], LAT, ATC, am serving as scribe for Dr. [name].  Kelly [name] is a 80 y.o. female who presents to [place] at [facility] today for mid-back pain just below her scapula, lower back pain and R shoulder/post upper arm pain after slipping and falling on the ice on 06/28/20, landing on the R side of her body.  She locates her pain to her mid-back just below her scapula, lower back and R post upper arm pain.  She has been seeing PT for other reasons and her PT recommended that she be seen by a physician for her new c/o following her fall.  Of note, pt has a hx of R breast cancer w/ lymph node involvement and had a lumpectomy.  Additionally she has bilateral hip and low back pain.  She is also seeing physical therapy for this which is helpful.  No radiating pain weakness or numbness distally.  Aggravating factors: transitioning from sit-to-stand; walking; lowering her R arm from an overhead position Treatments tried: PT; Gabapentin;    Pertinent review of systems: No fevers or chills  Relevant historical information: History of breast cancer.   Exam:  BP 110/84 (BP Location: Left Arm, Patient Position: Sitting, Cuff Size: Normal)   Pulse 77   Ht 5\' 7"  (1.702 m)   Wt 209 lb 9.6 oz (95.1 kg)   SpO2 95%   BMI 32.83 kg/m  General: Well Developed, well nourished, and in no acute distress.   MSK: Right shoulder normal-appearing Nontender. Normal motion Strength 4/5 abduction 5/5 internal rotation. Positive Hawkins and Neer's test. Negative Yergason's and speeds test.  Hips bilaterally tender palpation greater trochanter.  L-spine nontender midline.    Lab and Radiology Results  X-ray images right shoulder, pelvis, L-spine obtained today personally and independently interpreted.  Right shoulder: No fractures.  Mild AC DJD.  Pelvis: No fractures or severe hip OA.  L- Spine: DDD L5-S1.  No fractures or significant malalignment.  Await  formal radiology review  Diagnostic Limited MSK Ultrasound of: Right shoulder Biceps tendon intact. Subscapularis tendon intact. Supraspinatus tendon intact. Infraspinatus and intact Impression: No large rotator cuff tear    Assessment and Plan: 80 y.o. female with right shoulder pain after fall.  No evidence of severe large rotator cuff tear. Fractures not visible on x-ray either.  Radiology overread still pending.  Physical therapy should be very helpful for this.  Recheck back with me in about 1 month.  Additionally patient has evidence of trochanteric bursitis and low back spasm.  Physical therapy should also be helpful for these conditions as well.  X-rays for both did not show a fracture however radiology overread is still pending.  PDMP not reviewed this encounter. Orders Placed This Encounter  Procedures  . [place] LIMITED JOINT SPACE STRUCTURES UP RIGHT(NO LINKED CHARGES)    Order Specific Question:   Reason for Exam (SYMPTOM  OR DIAGNOSIS REQUIRED)    Answer:   R shoulder pain    Order Specific Question:   Preferred imaging location?    Answer:   [place]  . DG Shoulder Right    Standing Status:   Future    Number of Occurrences:   1    Standing Expiration Date:   07/06/2021    Order Specific Question:   Reason for Exam (SYMPTOM  OR DIAGNOSIS REQUIRED)    Answer:   eval right shoulder pain    Order Specific Question:   Preferred imaging location?  Answer:   [facility]  . DG Pelvis 1-2 Views    Standing Status:   Future    Number of Occurrences:   1    Standing Expiration Date:   07/06/2021    Order Specific Question:   Reason for Exam (SYMPTOM  OR DIAGNOSIS REQUIRED)    Answer:   eval hip pain    Order Specific Question:   Preferred imaging location?    Answer:   [facility]  . DG Lumbar Spine 2-3 Views    Standing Status:   Future    Number of Occurrences:   1    Standing Expiration Date:   07/06/2021    Order Specific  Question:   Reason for Exam (SYMPTOM  OR DIAGNOSIS REQUIRED)    Answer:   eval ow back pain    Order Specific Question:   Preferred imaging location?    Answer:   [facility]  . Ambulatory referral to Physical Therapy    Referral Priority:   Routine    Referral Type:   Physical Medicine    Referral Reason:   Specialty Services Required    Requested Specialty:   Physical Therapy   No orders of the defined types were placed in this encounter.    Discussed warning signs or symptoms. Please see discharge instructions. Patient expresses understanding.   The above documentation has been reviewed and is accurate and complete [name], M.D.

## 2020-07-06 NOTE — Patient Instructions (Signed)
Thank you for coming in today.  I've referred you to Physical Therapy.  Let us know if you don't hear from them in one week.  Please get an Xray today before you leave  Recheck in 1 month.   If not better we can do more.

## 2020-07-07 ENCOUNTER — Encounter: Payer: Medicare HMO | Admitting: Physical Therapy

## 2020-07-07 NOTE — Progress Notes (Signed)
X-ray lumbar spine shows evidence of muscle spasm and arthritis.  No fractures visible.

## 2020-07-07 NOTE — Progress Notes (Signed)
X-ray pelvis shows bilateral hip arthritis that is rated as mild.  No fractures are present.

## 2020-07-07 NOTE — Progress Notes (Signed)
X-ray right shoulder shows no fractures visible.  Some arthritis is present in the shoulder.

## 2020-07-08 ENCOUNTER — Encounter: Payer: Self-pay | Admitting: Physical Therapy

## 2020-07-08 ENCOUNTER — Ambulatory Visit: Payer: Medicare HMO | Admitting: Physical Therapy

## 2020-07-08 ENCOUNTER — Other Ambulatory Visit: Payer: Self-pay | Admitting: Internal Medicine

## 2020-07-08 ENCOUNTER — Other Ambulatory Visit: Payer: Self-pay

## 2020-07-08 DIAGNOSIS — M25611 Stiffness of right shoulder, not elsewhere classified: Secondary | ICD-10-CM

## 2020-07-08 DIAGNOSIS — M5441 Lumbago with sciatica, right side: Secondary | ICD-10-CM | POA: Diagnosis not present

## 2020-07-08 DIAGNOSIS — R600 Localized edema: Secondary | ICD-10-CM | POA: Diagnosis not present

## 2020-07-08 DIAGNOSIS — M542 Cervicalgia: Secondary | ICD-10-CM

## 2020-07-08 DIAGNOSIS — M25612 Stiffness of left shoulder, not elsewhere classified: Secondary | ICD-10-CM | POA: Diagnosis not present

## 2020-07-08 DIAGNOSIS — Z483 Aftercare following surgery for neoplasm: Secondary | ICD-10-CM
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
## 2020-07-12 ENCOUNTER — Other Ambulatory Visit: Payer: Self-pay

## 2020-07-12 ENCOUNTER — Ambulatory Visit: Payer: Medicare HMO | Admitting: Physical Therapy

## 2020-07-12 ENCOUNTER — Encounter: Payer: Self-pay | Admitting: Physical Therapy

## 2020-07-12 DIAGNOSIS — M25612 Stiffness of left shoulder, not elsewhere classified: Secondary | ICD-10-CM | POA: Diagnosis not present

## 2020-07-12 DIAGNOSIS — M25611 Stiffness of right shoulder, not elsewhere classified: Secondary | ICD-10-CM | POA: Diagnosis not present

## 2020-07-12 DIAGNOSIS — M5441 Lumbago with sciatica, right side: Secondary | ICD-10-CM | POA: Diagnosis not present

## 2020-07-12 DIAGNOSIS — Z483 Aftercare following surgery for neoplasm: Secondary | ICD-10-CM

## 2020-07-12 DIAGNOSIS — R600 Localized edema: Secondary | ICD-10-CM | POA: Diagnosis not present

## 2020-07-12 DIAGNOSIS — M542 Cervicalgia: Secondary | ICD-10-CM
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
## 2020-07-12 NOTE — Patient Instructions (Signed)
HIP: Abduction - Standing    Stand tall with good posture, Feel your abdomen engage.   Raise leg out and slightly back. Feel work on your standing leg.  _5__ reps per set, ___3sets per day, __7_ days per week Hold onto a support.    At the gym:  Work on hip aductors, lateral walks, light band around the knees to keep knee in good position over second toe Box squats to high box/mat. Consider adding power component, split squats, add weights as tolerated. Teach hip hinge and progress dead lift   Copyright  VHI. All rights reserved.

## 2020-07-13 ENCOUNTER — Inpatient Hospital Stay: Payer: Medicare HMO | Attending: Oncology | Admitting: Oncology

## 2020-07-13 DIAGNOSIS — C50411 Malignant neoplasm of upper-outer quadrant of right female breast: Secondary | ICD-10-CM | POA: Diagnosis not present

## 2020-07-13 DIAGNOSIS — H25013 Cortical age-related cataract, bilateral: Secondary | ICD-10-CM | POA: Diagnosis not present

## 2020-07-13 DIAGNOSIS — H2513 Age-related nuclear cataract, bilateral: Secondary | ICD-10-CM | POA: Diagnosis not present

## 2020-07-13 DIAGNOSIS — H40023 Open angle with borderline findings, high risk, bilateral: Secondary | ICD-10-CM | POA: Diagnosis not present

## 2020-07-13 DIAGNOSIS — H524 Presbyopia: Secondary | ICD-10-CM | POA: Diagnosis not present

## 2020-07-13 DIAGNOSIS — C50911 Malignant neoplasm of unspecified site of right female breast: Secondary | ICD-10-CM | POA: Diagnosis not present

## 2020-07-13 DIAGNOSIS — Z170 Estrogen receptor positive status [ER+]: Secondary | ICD-10-CM | POA: Diagnosis not present

## 2020-07-14 ENCOUNTER — Telehealth: Payer: Self-pay | Admitting: Oncology

## 2020-07-14 ENCOUNTER — Encounter: Payer: Medicare HMO | Admitting: Physical Therapy

## 2020-07-14 NOTE — Telephone Encounter (Signed)
Scheduled appts per 3/1 los. Pt confirmed appt date and time.

## 2020-07-15 ENCOUNTER — Ambulatory Visit: Payer: Medicare HMO | Attending: Surgery

## 2020-07-15 ENCOUNTER — Other Ambulatory Visit: Payer: Self-pay

## 2020-07-15 DIAGNOSIS — M6281 Muscle weakness (generalized): Secondary | ICD-10-CM | POA: Diagnosis present

## 2020-07-15 DIAGNOSIS — M5441 Lumbago with sciatica, right side: Secondary | ICD-10-CM | POA: Diagnosis present

## 2020-07-15 DIAGNOSIS — Z483 Aftercare following surgery for neoplasm: Secondary | ICD-10-CM | POA: Insufficient documentation

## 2020-07-15 DIAGNOSIS — R252 Cramp and spasm: Secondary | ICD-10-CM | POA: Insufficient documentation

## 2020-07-15 DIAGNOSIS — R600 Localized edema: Secondary | ICD-10-CM | POA: Diagnosis present

## 2020-07-15 DIAGNOSIS — M25512 Pain in left shoulder: Secondary | ICD-10-CM | POA: Diagnosis present

## 2020-07-15 DIAGNOSIS — Z170 Estrogen receptor positive status [ER+]: Secondary | ICD-10-CM | POA: Diagnosis present

## 2020-07-15 DIAGNOSIS — M25551 Pain in right hip: Secondary | ICD-10-CM | POA: Insufficient documentation

## 2020-07-15 DIAGNOSIS — M542 Cervicalgia: Secondary | ICD-10-CM | POA: Diagnosis present

## 2020-07-15 DIAGNOSIS — M25611 Stiffness of right shoulder, not elsewhere classified: Secondary | ICD-10-CM | POA: Insufficient documentation

## 2020-07-15 DIAGNOSIS — C50411 Malignant neoplasm of upper-outer quadrant of right female breast: Secondary | ICD-10-CM | POA: Diagnosis present

## 2020-07-15 DIAGNOSIS — M25552 Pain in left hip: Secondary | ICD-10-CM | POA: Diagnosis present

## 2020-07-15 DIAGNOSIS — M25612 Stiffness of left shoulder, not elsewhere classified: Secondary | ICD-10-CM | POA: Insufficient documentation

## 2020-07-15 NOTE — Patient Instructions (Signed)
Over Head Pull: Narrow and Wide Grip   Cancer Rehab [phone]   On back, knees bent, feet flat, band across thighs, elbows straight but relaxed. Pull hands apart (start). Keeping elbows straight, bring arms up and over head, hands toward floor. Keep pull steady on band. Hold momentarily. Return slowly, keeping pull steady, back to start. Then do same with a wider grip on the band (past shoulder width) Repeat _5-10__ times. Band color __yellow____   Side Pull: Double Arm   On back, knees bent, feet flat. Arms perpendicular to body, shoulder level, elbows straight but relaxed. Pull arms out to sides, elbows straight. Resistance band comes across collarbones, hands toward floor. Hold momentarily. Slowly return to starting position. Repeat _5-10__ times. Band color _yellow____   Sword   On back, knees bent, feet flat, left hand on left hip, right hand above left. Pull right arm DIAGONALLY (hip to shoulder) across chest. Bring right arm along head toward floor. Hold momentarily. Slowly return to starting position. Repeat _5-10__ times. Do with left arm. Band color _yellow_____   Shoulder Rotation: Double Arm   On back, knees bent, feet flat, elbows tucked at sides, bent 90, hands palms up. Pull hands apart and down toward floor, keeping elbows near sides. Hold momentarily. Slowly return to starting position. Repeat _5-10__ times. Band color __yellow____   CHEST: Doorway, Bilateral - Standing    Standing in doorway with one foot in front of other, place hands on wall with elbows bent, shoulders relaxed and tummy tight. Push onto front foot forward. Hold _20-30__ seconds. _3__ reps per set, _2-3__ sets per day
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
## 2020-07-19 ENCOUNTER — Ambulatory Visit: Payer: Medicare HMO | Admitting: Physical Therapy

## 2020-07-19 ENCOUNTER — Other Ambulatory Visit: Payer: Self-pay

## 2020-07-19 ENCOUNTER — Encounter: Payer: Self-pay | Admitting: Physical Therapy

## 2020-07-19 DIAGNOSIS — R600 Localized edema: Secondary | ICD-10-CM | POA: Diagnosis not present

## 2020-07-19 DIAGNOSIS — M25552 Pain in left hip: Secondary | ICD-10-CM | POA: Diagnosis not present

## 2020-07-19 DIAGNOSIS — M6281 Muscle weakness (generalized): Secondary | ICD-10-CM | POA: Diagnosis not present

## 2020-07-19 DIAGNOSIS — M542 Cervicalgia: Secondary | ICD-10-CM

## 2020-07-19 DIAGNOSIS — M25551 Pain in right hip: Secondary | ICD-10-CM | POA: Diagnosis not present

## 2020-07-19 DIAGNOSIS — M5441 Lumbago with sciatica, right side: Secondary | ICD-10-CM | POA: Diagnosis not present

## 2020-07-19 DIAGNOSIS — Z483 Aftercare following surgery for neoplasm: Secondary | ICD-10-CM | POA: Diagnosis not present

## 2020-07-19 DIAGNOSIS — R252 Cramp and spasm: Secondary | ICD-10-CM | POA: Diagnosis not present

## 2020-07-19 DIAGNOSIS — M25612 Stiffness of left shoulder, not elsewhere classified: Secondary | ICD-10-CM | POA: Diagnosis not present

## 2020-07-19 DIAGNOSIS — M25611 Stiffness of right shoulder, not elsewhere classified: Secondary | ICD-10-CM | POA: Diagnosis not present
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
## 2020-07-21 ENCOUNTER — Encounter: Payer: Medicare HMO | Admitting: Physical Therapy

## 2020-07-22 ENCOUNTER — Other Ambulatory Visit: Payer: Self-pay

## 2020-07-22 ENCOUNTER — Encounter: Payer: Self-pay | Admitting: Physical Therapy

## 2020-07-22 ENCOUNTER — Ambulatory Visit: Payer: Medicare HMO | Admitting: Physical Therapy

## 2020-07-22 DIAGNOSIS — C50411 Malignant neoplasm of upper-outer quadrant of right female breast: Secondary | ICD-10-CM

## 2020-07-22 DIAGNOSIS — M6281 Muscle weakness (generalized): Secondary | ICD-10-CM

## 2020-07-22 DIAGNOSIS — R600 Localized edema: Secondary | ICD-10-CM | POA: Diagnosis not present

## 2020-07-22 DIAGNOSIS — M25611 Stiffness of right shoulder, not elsewhere classified: Secondary | ICD-10-CM | POA: Diagnosis not present

## 2020-07-22 DIAGNOSIS — M25612 Stiffness of left shoulder, not elsewhere classified: Secondary | ICD-10-CM | POA: Diagnosis not present

## 2020-07-22 DIAGNOSIS — Z170 Estrogen receptor positive status [ER+]: Secondary | ICD-10-CM

## 2020-07-22 DIAGNOSIS — M25552 Pain in left hip: Secondary | ICD-10-CM | POA: Diagnosis not present

## 2020-07-22 DIAGNOSIS — M5441 Lumbago with sciatica, right side: Secondary | ICD-10-CM

## 2020-07-22 DIAGNOSIS — M25551 Pain in right hip: Secondary | ICD-10-CM | POA: Diagnosis not present

## 2020-07-22 DIAGNOSIS — R252 Cramp and spasm: Secondary | ICD-10-CM

## 2020-07-22 DIAGNOSIS — M542 Cervicalgia: Secondary | ICD-10-CM | POA: Diagnosis not present

## 2020-07-22 DIAGNOSIS — M25512 Pain in left shoulder: Secondary | ICD-10-CM

## 2020-07-22 DIAGNOSIS — Z483 Aftercare following surgery for neoplasm: Secondary | ICD-10-CM | POA: Diagnosis not present
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
## 2020-07-26 ENCOUNTER — Encounter: Payer: Self-pay | Admitting: Family Medicine

## 2020-07-28 ENCOUNTER — Other Ambulatory Visit: Payer: Self-pay

## 2020-07-28 ENCOUNTER — Encounter: Payer: Self-pay | Admitting: Physical Therapy

## 2020-07-28 ENCOUNTER — Ambulatory Visit: Payer: Medicare HMO | Admitting: Physical Therapy

## 2020-07-28 DIAGNOSIS — R600 Localized edema: Secondary | ICD-10-CM | POA: Diagnosis not present

## 2020-07-28 DIAGNOSIS — M6281 Muscle weakness (generalized): Secondary | ICD-10-CM | POA: Diagnosis not present

## 2020-07-28 DIAGNOSIS — M25611 Stiffness of right shoulder, not elsewhere classified: Secondary | ICD-10-CM | POA: Diagnosis not present

## 2020-07-28 DIAGNOSIS — M5441 Lumbago with sciatica, right side: Secondary | ICD-10-CM

## 2020-07-28 DIAGNOSIS — M25552 Pain in left hip: Secondary | ICD-10-CM | POA: Diagnosis not present

## 2020-07-28 DIAGNOSIS — M25551 Pain in right hip: Secondary | ICD-10-CM

## 2020-07-28 DIAGNOSIS — M542 Cervicalgia: Secondary | ICD-10-CM

## 2020-07-28 DIAGNOSIS — Z483 Aftercare following surgery for neoplasm: Secondary | ICD-10-CM | POA: Diagnosis not present

## 2020-07-28 DIAGNOSIS — M25612 Stiffness of left shoulder, not elsewhere classified: Secondary | ICD-10-CM | POA: Diagnosis not present

## 2020-07-28 DIAGNOSIS — R252 Cramp and spasm: Secondary | ICD-10-CM | POA: Diagnosis not present
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
## 2020-07-29 NOTE — Progress Notes (Signed)
I, Kelly [name], LAT, ATC, am serving as scribe for Dr. Lynne [name]  Kelly [name] is a 80 y.o. female who presents to [facility] today for R shoulder pain f/u.  She was last seen by Dr. Georgina [name] on 07/06/20 for mid-back pain, LBP and R shoulder pain after slipping and falling on the ice on 06/28/20 and was advised to con't w/ PT for these new areas of pain.  She has completed 10 PT sessions at the [facility] and pt states that her PT is concerned that she has R shoulder impingement.  Since her last visit w/ Dr. Georgina [name], pt reports R shoulder has been bothering her since her fall on 2/14. Pt reports pain when reaching, ABD, ER. Pt locates pain to the anterior aspect, into chest, posteriorly, and into deltoid. Pt c/o bilat neck pain, R>L.  Pt also c/o pain in bilat hip/leg pain, R>L. Pt locates pain to the lateral aspect of R hip that radiates distally to foot. Pt is not ambulating well and have stability difficulties when walking to the exam room today.  Her physical therapist noted significant weakness and is concerned that she may have a neurologic issue in her lumbar spine.  In addition physical therapy notes hip weakness and request dedicated physical therapy to address her hips.  She is currently receiving breast cancer believed physical therapy primarily for her shoulder.  Diagnostic imaging: L-spine, pelvis and R shoulder XR- 07/06/20  Pertinent review of systems: No fevers or chills  Relevant historical information: History of lumbar radiculopathy.  History of breast cancer.   Exam:  BP (!) 121/96 (BP Location: Left Arm, Patient Position: Sitting, Cuff Size: Normal)   Pulse 91   Ht 5\' 7"  (1.702 m)   Wt 214 lb 12.8 oz (97.4 kg)   SpO2 95%   BMI 33.64 kg/m  General: Well Developed, well nourished, and in no acute distress.   MSK:  C-spine normal-appearing nontender midline.  Tender palpation right cervical paraspinal musculature. Decreased  cervical motion. Upper extremity strength is intact with exception of right shoulder abduction which is diminished.  Right shoulder normal-appearing decreased range of motion to abduction and internal rotation. Decrease strength abduction and external rotation 4/5. Positive impingement testing.  L-spine normal-appearing Nontender midline.  Decreased lumbar motion. Lower extremity strength diminished hip abduction bilaterally 4/5 hip flexion is intact.  Knee extension diminished 4/5 bilaterally.  Knee flexion intact.  Foot dorsiflexion and plantar flexion intact. Reflexes are intact bilateral lower extremities.  Difficulty standing from a seated position without rocking or pushing up from hands.   Unsteady gait.    Lab and Radiology Results  X-ray images C-spine and lumbar spine obtained today personally and independently interpreted.  C-spine: DDD worse at C5-6.  No fractures or malalignment.  L-spine: Significant multilevel DDD worse at L5-S1 with facet DJD.  No fractures visible.  Await formal radiology review  EXAM: MRI LUMBAR SPINE WITHOUT CONTRAST  TECHNIQUE: Multiplanar, multisequence MR imaging of the lumbar spine was performed. No intravenous contrast was administered.  COMPARISON:  Radiographs dated 02/17/2017  FINDINGS: Segmentation:  Standard.  Alignment:  Minimal retrolisthesis of L1 on L2.  Vertebrae: No fracture, evidence of discitis, or significant bone lesion.  Conus medullaris and cauda equina: Conus extends to the L2 level. Conus and cauda equina appear normal.  Paraspinal and other soft tissues: 25 x 16 mm nodule in the left adrenal gland, most likely a benign adenoma. 3.5 cm simple cyst  on the lower pole of the right kidney. Otherwise normal.  Disc levels:  L1-2: Disc desiccation with disc space narrowing. 3 mm retrolisthesis with minimal broad-based bulge of the uncovered disc with no neural impingement.  L2-3: Slight disc  desiccation with a tiny broad-based disc bulge with no neural impingement.  L3-4: Tiny broad-based disc bulge with no neural impingement. Moderate bilateral facet arthritis with ligamentum flavum hypertrophy. This creates slight narrowing of the AP dimension of the spinal canal without significant spinal stenosis. No foraminal stenosis.  L4-5: Slight disc desiccation. Tiny broad-based disc bulge with no neural impingement. Slight bilateral facet arthritis, right greater than left.  L5-S1: Disc desiccation with disc space narrowing. Minimal broad-based disc bulge slightly asymmetric to the left with no neural impingement.  IMPRESSION: 1. 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 2. Slight right facet arthritis at L4-5.  No neural impingement.   Electronically Signed   By: Lorriane Shire M.D.   On: 05/03/2017 16:21 I, Kelly [name], personally (independently) visualized and performed the interpretation of the images attached in this note.    Assessment and Plan: 80 y.o. female with   Right shoulder pain.  Worsened after a recent fall.  Patient is failing typical conservative management including physical therapy.  At this point concern for rotator cuff tear.  We will proceed to MRI to further characterize cause of pain.  New leg weakness and radicular pain.  Again this did worsen after a fall.  My interpretation of lumbar spine x-ray today does not show a fracture however radiology overread is still pending.  Patient does have considerable degenerative changes on x-ray and on prior lumbar MRI from 2018 has degenerative changes and some neural impingement.  However based on her new weakness we will proceed to lumbar MRI to further characterize cause of weakness.   We will go ahead and put a referral in for dedicated physical therapy for the hip abductor pain and weakness.  This is probably  not related to lumbar radiculopathy related weakness and more likely related to simple hip abductor weakness and tendinopathy.  This could benefit from dedicated physical therapy.   PDMP not reviewed this encounter. Orders Placed This Encounter  Procedures  . MR SHOULDER RIGHT WO CONTRAST    Standing Status:   Future    Standing Expiration Date:   07/30/2021    Order Specific Question:   What is the patient's sedation requirement?    Answer:   No Sedation    Order Specific Question:   Does the patient have a pacemaker or implanted devices?    Answer:   No    Order Specific Question:   Preferred imaging location?    Answer:   Product/process development scientist (table limit-350lbs)  . DG Cervical Spine 2 or 3 views    Standing Status:   Future    Number of Occurrences:   1    Standing Expiration Date:   07/30/2021    Order Specific Question:   Reason for Exam (SYMPTOM  OR DIAGNOSIS REQUIRED)    Answer:   eval cspine    Order Specific Question:   Preferred imaging location?    Answer:   [facility]  . DG Lumbar Spine 2-3 Views    Standing Status:   Future    Number of Occurrences:   1    Standing Expiration Date:   07/30/2021    Order Specific  Question:   Reason for Exam (SYMPTOM  OR DIAGNOSIS REQUIRED)    Answer:   Rt s1 rad    Order Specific Question:   Preferred imaging location?    Answer:   [facility]  . MR Lumbar Spine Wo Contrast    Standing Status:   Future    Standing Expiration Date:   07/30/2021    Order Specific Question:   What is the patient's sedation requirement?    Answer:   No Sedation    Order Specific Question:   Does the patient have a pacemaker or implanted devices?    Answer:   No    Order Specific Question:   Preferred imaging location?    Answer:   Product/process development scientist (table limit-350lbs)  . Ambulatory referral to Physical Therapy    Referral Priority:   Routine    Referral Type:   Physical Medicine    Referral Reason:   Specialty Services  Required    Requested Specialty:   Physical Therapy   No orders of the defined types were placed in this encounter.    Discussed warning signs or symptoms. Please see discharge instructions. Patient expresses understanding.   The above documentation has been reviewed and is accurate and complete Kelly [name], M.D.

## 2020-07-30 ENCOUNTER — Ambulatory Visit (INDEPENDENT_AMBULATORY_CARE_PROVIDER_SITE_OTHER): Payer: Medicare HMO

## 2020-07-30 ENCOUNTER — Encounter: Payer: Self-pay | Admitting: Physical Therapy

## 2020-07-30 ENCOUNTER — Ambulatory Visit: Payer: Medicare HMO | Admitting: Family Medicine

## 2020-07-30 ENCOUNTER — Ambulatory Visit: Payer: Medicare HMO | Admitting: Physical Therapy

## 2020-07-30 ENCOUNTER — Encounter: Payer: Self-pay | Admitting: Family Medicine

## 2020-07-30 ENCOUNTER — Other Ambulatory Visit: Payer: Self-pay

## 2020-07-30 VITALS — BP 121/96 | HR 91 | Ht 67.0 in | Wt 214.8 lb

## 2020-07-30 DIAGNOSIS — M6281 Muscle weakness (generalized): Secondary | ICD-10-CM

## 2020-07-30 DIAGNOSIS — M25512 Pain in left shoulder: Secondary | ICD-10-CM

## 2020-07-30 DIAGNOSIS — M542 Cervicalgia: Secondary | ICD-10-CM

## 2020-07-30 DIAGNOSIS — M25611 Stiffness of right shoulder, not elsewhere classified: Secondary | ICD-10-CM | POA: Diagnosis not present

## 2020-07-30 DIAGNOSIS — R600 Localized edema: Secondary | ICD-10-CM | POA: Diagnosis not present

## 2020-07-30 DIAGNOSIS — M25552 Pain in left hip: Secondary | ICD-10-CM

## 2020-07-30 DIAGNOSIS — M25612 Stiffness of left shoulder, not elsewhere classified: Secondary | ICD-10-CM

## 2020-07-30 DIAGNOSIS — M25551 Pain in right hip: Secondary | ICD-10-CM

## 2020-07-30 DIAGNOSIS — Z483 Aftercare following surgery for neoplasm: Secondary | ICD-10-CM | POA: Diagnosis not present

## 2020-07-30 DIAGNOSIS — M5441 Lumbago with sciatica, right side: Secondary | ICD-10-CM | POA: Diagnosis not present

## 2020-07-30 DIAGNOSIS — M545 Low back pain, unspecified: Secondary | ICD-10-CM | POA: Diagnosis not present

## 2020-07-30 DIAGNOSIS — M25511 Pain in right shoulder: Secondary | ICD-10-CM | POA: Diagnosis not present

## 2020-07-30 DIAGNOSIS — M79604 Pain in right leg: Secondary | ICD-10-CM | POA: Diagnosis not present

## 2020-07-30 DIAGNOSIS — R252 Cramp and spasm: Secondary | ICD-10-CM | POA: Diagnosis not present

## 2020-07-30 DIAGNOSIS — R29898 Other symptoms and signs involving the musculoskeletal system: Secondary | ICD-10-CM | POA: Diagnosis not present
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
## 2020-07-30 NOTE — Patient Instructions (Addendum)
Thank you for coming in today.  You should hear from MRI scheduling within 1 week. If you do not hear please let me know.   Please get an Xray today before you leave  Recheck following MRIs.   Keep me updated.   Ok to do a little safe safe exercise if you are not getting hurt before the MRI.

## 2020-08-02 ENCOUNTER — Ambulatory Visit: Payer: Medicare HMO | Admitting: Physical Therapy

## 2020-08-02 ENCOUNTER — Other Ambulatory Visit: Payer: Self-pay

## 2020-08-02 ENCOUNTER — Encounter: Payer: Self-pay | Admitting: Physical Therapy

## 2020-08-02 DIAGNOSIS — M25512 Pain in left shoulder: Secondary | ICD-10-CM

## 2020-08-02 DIAGNOSIS — M25612 Stiffness of left shoulder, not elsewhere classified: Secondary | ICD-10-CM | POA: Diagnosis not present

## 2020-08-02 DIAGNOSIS — M25611 Stiffness of right shoulder, not elsewhere classified: Secondary | ICD-10-CM | POA: Diagnosis not present

## 2020-08-02 DIAGNOSIS — R600 Localized edema: Secondary | ICD-10-CM

## 2020-08-02 DIAGNOSIS — R252 Cramp and spasm: Secondary | ICD-10-CM | POA: Diagnosis not present

## 2020-08-02 DIAGNOSIS — Z483 Aftercare following surgery for neoplasm: Secondary | ICD-10-CM | POA: Diagnosis not present

## 2020-08-02 DIAGNOSIS — M542 Cervicalgia: Secondary | ICD-10-CM

## 2020-08-02 DIAGNOSIS — M5441 Lumbago with sciatica, right side: Secondary | ICD-10-CM

## 2020-08-02 DIAGNOSIS — M6281 Muscle weakness (generalized): Secondary | ICD-10-CM | POA: Diagnosis not present

## 2020-08-02 DIAGNOSIS — M25552 Pain in left hip: Secondary | ICD-10-CM

## 2020-08-02 DIAGNOSIS — M25551 Pain in right hip: Secondary | ICD-10-CM | POA: Diagnosis not present
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
## 2020-08-02 NOTE — Progress Notes (Signed)
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  No fractures are visible.

## 2020-08-02 NOTE — Progress Notes (Signed)
X-ray cervical spine shows arthritis changes in the neck.  No fractures.

## 2020-08-03 ENCOUNTER — Ambulatory Visit: Payer: Medicare HMO | Admitting: Family Medicine

## 2020-08-04 ENCOUNTER — Encounter: Payer: Self-pay | Admitting: Physical Therapy

## 2020-08-04 ENCOUNTER — Other Ambulatory Visit: Payer: Self-pay

## 2020-08-04 ENCOUNTER — Ambulatory Visit: Payer: Medicare HMO | Admitting: Physical Therapy

## 2020-08-04 DIAGNOSIS — M25612 Stiffness of left shoulder, not elsewhere classified: Secondary | ICD-10-CM | POA: Diagnosis not present

## 2020-08-04 DIAGNOSIS — M25611 Stiffness of right shoulder, not elsewhere classified: Secondary | ICD-10-CM | POA: Diagnosis not present

## 2020-08-04 DIAGNOSIS — M25551 Pain in right hip: Secondary | ICD-10-CM | POA: Diagnosis not present

## 2020-08-04 DIAGNOSIS — M25552 Pain in left hip: Secondary | ICD-10-CM | POA: Diagnosis not present

## 2020-08-04 DIAGNOSIS — R252 Cramp and spasm: Secondary | ICD-10-CM | POA: Diagnosis not present

## 2020-08-04 DIAGNOSIS — M5441 Lumbago with sciatica, right side: Secondary | ICD-10-CM | POA: Diagnosis not present

## 2020-08-04 DIAGNOSIS — Z483 Aftercare following surgery for neoplasm: Secondary | ICD-10-CM | POA: Diagnosis not present

## 2020-08-04 DIAGNOSIS — R600 Localized edema: Secondary | ICD-10-CM | POA: Diagnosis not present

## 2020-08-04 DIAGNOSIS — M542 Cervicalgia: Secondary | ICD-10-CM | POA: Diagnosis not present

## 2020-08-04 DIAGNOSIS — M6281 Muscle weakness (generalized): Secondary | ICD-10-CM | POA: Diagnosis not present
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
## 2020-08-06 ENCOUNTER — Encounter: Payer: Self-pay | Admitting: Family Medicine

## 2020-08-08 ENCOUNTER — Ambulatory Visit (INDEPENDENT_AMBULATORY_CARE_PROVIDER_SITE_OTHER): Payer: Medicare HMO

## 2020-08-08 ENCOUNTER — Other Ambulatory Visit: Payer: Self-pay

## 2020-08-08 DIAGNOSIS — M79601 Pain in right arm: Secondary | ICD-10-CM | POA: Diagnosis not present

## 2020-08-08 DIAGNOSIS — M25511 Pain in right shoulder: Secondary | ICD-10-CM

## 2020-08-08 DIAGNOSIS — M19011 Primary osteoarthritis, right shoulder: Secondary | ICD-10-CM | POA: Diagnosis not present

## 2020-08-08 DIAGNOSIS — M7551 Bursitis of right shoulder: Secondary | ICD-10-CM | POA: Diagnosis not present

## 2020-08-08 DIAGNOSIS — M545 Low back pain, unspecified: Secondary | ICD-10-CM | POA: Diagnosis not present

## 2020-08-08 DIAGNOSIS — R29898 Other symptoms and signs involving the musculoskeletal system: Secondary | ICD-10-CM

## 2020-08-08 DIAGNOSIS — M75101 Unspecified rotator cuff tear or rupture of right shoulder, not specified as traumatic: Secondary | ICD-10-CM | POA: Diagnosis not present

## 2020-08-09 ENCOUNTER — Ambulatory Visit: Payer: Medicare HMO | Admitting: Physical Therapy

## 2020-08-09 ENCOUNTER — Encounter: Payer: Self-pay | Admitting: Physical Therapy

## 2020-08-09 ENCOUNTER — Encounter: Payer: Self-pay | Admitting: Family Medicine

## 2020-08-09 DIAGNOSIS — M25551 Pain in right hip: Secondary | ICD-10-CM

## 2020-08-09 DIAGNOSIS — M25611 Stiffness of right shoulder, not elsewhere classified: Secondary | ICD-10-CM | POA: Diagnosis not present

## 2020-08-09 DIAGNOSIS — M25612 Stiffness of left shoulder, not elsewhere classified: Secondary | ICD-10-CM | POA: Diagnosis not present

## 2020-08-09 DIAGNOSIS — R600 Localized edema: Secondary | ICD-10-CM | POA: Diagnosis not present

## 2020-08-09 DIAGNOSIS — M5441 Lumbago with sciatica, right side: Secondary | ICD-10-CM | POA: Diagnosis not present

## 2020-08-09 DIAGNOSIS — R252 Cramp and spasm: Secondary | ICD-10-CM

## 2020-08-09 DIAGNOSIS — Z483 Aftercare following surgery for neoplasm: Secondary | ICD-10-CM | POA: Diagnosis not present

## 2020-08-09 DIAGNOSIS — M25552 Pain in left hip: Secondary | ICD-10-CM | POA: Diagnosis not present

## 2020-08-09 DIAGNOSIS — M6281 Muscle weakness (generalized): Secondary | ICD-10-CM | POA: Diagnosis not present

## 2020-08-09 DIAGNOSIS — M542 Cervicalgia: Secondary | ICD-10-CM | POA: Diagnosis not present
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
## 2020-08-10 DIAGNOSIS — G4733 Obstructive sleep apnea (adult) (pediatric): Secondary | ICD-10-CM | POA: Diagnosis not present

## 2020-08-10 MED ORDER — GABAPENTIN 100 MG PO CAPS: 200.0000 mg | ORAL_CAPSULE | Freq: Every day | ORAL | 3 refills | Status: DC

## 2020-08-10 NOTE — Progress Notes (Signed)
Right shoulder MRI shows severe rotator cuff tendinitis of the supraspinatus tendon.  It also shows moderate and mild rotator cuff tendinitis of the other rotator cuff tendons.  There is a small tear of one of the rotator cuff tendons.  You also have some bursitis as well.  We will discuss this in further detail during your visit with me tomorrow.

## 2020-08-10 NOTE — Progress Notes (Signed)
I, Peterson Lombard, LAT, ATC acting as a scribe for [name], MD.  Kelly [name] is a 80 y.o. female who presents to [facility] today for f/u R shoulder pain and bilat leg weakness. Pt was last seen by Dr. Georgina Snell on 07/30/20 and was advised to proceed to MRI to further characterize cause of the pain. Today, pt reports neck is still bothering her w/ increased pain w/ cervical rotation to the R. Pt notes improved ITB tightness from treatment by PT.   Pt also c/o R knee pain w/ pain located to the anterior aspect. Pt also expresses concern for R shoulder w/ increased pain w/ shoulder ABD above 90 and ER. Pt reports she is currently out of gabapentin.  She was receiving physical therapy from the breast cancer physical therapist but has been transferred to conventional PT to focus more on her musculoskeletal issues.  Additionally she noted the incidental renal cyst and adrenal adenoma.  She is very concerned because her husband died from renal cell carcinoma after developing renal cysts.  Dx imaging: 08/08/20 R shoulder & L-spine MRI  07/30/20 L-spine & c-spine XR  07/06/20 R shoulder XR, pelvis XR, & L-spine XR  Pertinent review of systems: No fevers or chills.  No hematuria or change in urination.  Relevant historical information: Breast cancer history   Exam:  BP 114/80 (BP Location: Left Arm, Patient Position: Sitting, Cuff Size: Normal)   Pulse 75   Ht 5\' 7"  (1.702 m)   Wt 214 lb (97.1 kg)   SpO2 99%   BMI 33.52 kg/m  General: Well Developed, well nourished, and in no acute distress.   MSK:  Right shoulder normal-appearing decreased shoulder motion to abduction. Decrease strength to abduction. Positive impingement testing.  L-spine normal-appearing decreased lumbar motion.     Lab and Radiology Results No results found for this or any previous visit (from the past 72 hour(s)). MR Lumbar Spine Wo Contrast  Result Date: 08/09/2020 CLINICAL  DATA:  Low back pain, progressive neurologic deficit EXAM: MRI LUMBAR SPINE WITHOUT CONTRAST TECHNIQUE: Multiplanar, multisequence MR imaging of the lumbar spine was performed. No intravenous contrast was administered. COMPARISON:  07/30/2020 and prior. FINDINGS: Segmentation:  Standard. Alignment: Trace L1-2, L5-S1 retrolisthesis. Trace L3-4 anterolisthesis. Vertebrae: Modic type 2 endplate degenerative changes. Scattered hemangiomata. No fracture or aggressive osseous lesion. Conus medullaris and cauda equina: Conus extends to the L2 level. Conus and cauda equina appear normal. Disc levels: Multilevel desiccation. L1-2: Minimal disc bulge with shallow left lateral protrusion. Patent spinal canal and right neural foramen. Mild left neural foraminal narrowing. L2-3: No significant disc bulge. Patent spinal canal and neural foramen. L3-4: Minimal disc bulge and bilateral facet hypertrophy. Prominent dorsal epidural fat. Mild spinal canal narrowing. Patent neural foramen. L4-5: Minimal disc bulge and bilateral facet hypertrophy. Prominent dorsal epidural fat. Mild spinal canal and bilateral neural foraminal narrowing. L5-S1: Minimal disc bulge. Bilateral facet degenerative spurring. Patent spinal canal and left neural foramen. Mild right neural foraminal narrowing. Paraspinal and other soft tissues: Right renal cyst. Partially imaged left adrenal adenoma. IMPRESSION: Mild spinal canal narrowing at the L3-4, L4-5 levels. Mild left L1-2, bilateral L4-5 and right L5-S1 neural foraminal narrowing. Electronically Signed   By: Primitivo Gauze M.D.   On: 08/09/2020 08:22   MR SHOULDER RIGHT WO CONTRAST  Result Date: 08/09/2020 CLINICAL DATA:  Limited range of motion, right arm pain EXAM: MRI OF THE RIGHT SHOULDER WITHOUT CONTRAST TECHNIQUE: Multiplanar, multisequence MR imaging  of the shoulder was performed. No intravenous contrast was administered. COMPARISON:  None. FINDINGS: Rotator cuff: Severe tendinosis of the  supraspinatus tendon with fraying along the bursal surface. Moderate tendinosis of the infraspinatus tendon. Teres minor tendon is intact. Mild tendinosis of the subscapularis tendon with a small partial tear. Muscles: No muscle atrophy or edema. No intramuscular fluid collection or hematoma. Biceps Long Head: Mild tendinosis of the intra-articular portion of the long head of the biceps tendon. Acromioclavicular Joint: Severe arthropathy of the acromioclavicular joint. Type II acromion. Small amount of subacromial/subdeltoid bursal fluid. Glenohumeral Joint: Small joint effusion. Mild partial-thickness cartilage loss of the glenohumeral joint. Labrum: Superior labral degeneration with a tear. Bones: No fracture or dislocation. No aggressive osseous lesion. Other: No fluid collection or hematoma. IMPRESSION: 1. Severe tendinosis of the supraspinatus tendon with fraying along the bursal surface. 2. Moderate tendinosis of the infraspinatus tendon. 3. Mild tendinosis of the subscapularis tendon with a small partial tear. 4. Mild tendinosis of the intra-articular portion of the long head of the biceps tendon. 5. Mild subacromial/subdeltoid bursitis. Electronically Signed   By: Kathreen Devoid   On: 08/09/2020 09:00   I, [name], personally (independently) visualized and performed the interpretation of the images attached in this note.     Assessment and Plan: 80 y.o. female with right shoulder pain due to rotator cuff tendinopathy and small rotator cuff tear.  Continue conventional physical therapy and reassess in 1 month.  If not better consider steroid injection at that time.  Lumbar radiculopathy right L5.  Continue PT.  If not improved consider epidural steroid injection. Patient additionally has a fair amount of pain in her legs due to musculoskeletal deconditioning and weakness and will benefit from PT for this as well.  Renal cyst and adrenal adenoma incidentally noted on MRI of lumbar spine.  Will  assess further with renal ultrasound and consider CT scan if needed.  Ultimately this issue probably will be routed back to PCP or her oncologist but will start the initial work-up for now.   PDMP not reviewed this encounter. Orders Placed This Encounter  Procedures  . US RENAL    Standing Status:   Future    Standing Expiration Date:   08/11/2021    Order Specific Question:   Reason for exam:    Answer:   rt renal cyst and adrenal ademona seen on MRI incidentally    Order Specific Question:   Preferred imaging location?    Answer:   [id number]   No orders of the defined types were placed in this encounter.    Discussed warning signs or symptoms. Please see discharge instructions. Patient expresses understanding.   The above documentation has been reviewed and is accurate and complete [name], M.D.

## 2020-08-10 NOTE — Progress Notes (Signed)
MRI lumbar spine shows mild canal narrowing at L3-4 and L4-5 as well as areas of potential pinched nerves throughout lumbar spine.  Again we are going to go over this at further detail during the visit tomorrow.

## 2020-08-11 ENCOUNTER — Other Ambulatory Visit: Payer: Self-pay

## 2020-08-11 ENCOUNTER — Encounter: Payer: Medicare HMO | Admitting: Physical Therapy

## 2020-08-11 ENCOUNTER — Ambulatory Visit: Payer: Medicare HMO | Admitting: Family Medicine

## 2020-08-11 VITALS — BP 114/80 | HR 75 | Ht 67.0 in | Wt 214.0 lb

## 2020-08-11 DIAGNOSIS — R29898 Other symptoms and signs involving the musculoskeletal system: Secondary | ICD-10-CM | POA: Diagnosis not present

## 2020-08-11 DIAGNOSIS — M25511 Pain in right shoulder: Secondary | ICD-10-CM | POA: Diagnosis not present

## 2020-08-11 DIAGNOSIS — M79604 Pain in right leg: Secondary | ICD-10-CM

## 2020-08-11 DIAGNOSIS — D3501 Benign neoplasm of right adrenal gland: Secondary | ICD-10-CM | POA: Diagnosis not present

## 2020-08-11 DIAGNOSIS — N281 Cyst of kidney, acquired: Secondary | ICD-10-CM | POA: Diagnosis not present

## 2020-08-11 NOTE — Patient Instructions (Signed)
Thank you for coming in today.  Plan for Ultrasound and possibly CT scan in the future to evaluate the cyst and adrenal adenoma.    Continue PT.   Recheck in 1 month.  Consider shoulder injection at recheck if needed.

## 2020-08-16 ENCOUNTER — Encounter: Payer: Medicare HMO | Admitting: Physical Therapy

## 2020-08-17 ENCOUNTER — Other Ambulatory Visit: Payer: Self-pay

## 2020-08-17 ENCOUNTER — Ambulatory Visit: Payer: Medicare HMO | Attending: Family Medicine | Admitting: Physical Therapy

## 2020-08-17 ENCOUNTER — Encounter: Payer: Self-pay | Admitting: Physical Therapy

## 2020-08-17 ENCOUNTER — Ambulatory Visit: Payer: Medicare HMO

## 2020-08-17 DIAGNOSIS — M6281 Muscle weakness (generalized): Secondary | ICD-10-CM

## 2020-08-17 DIAGNOSIS — R252 Cramp and spasm: Secondary | ICD-10-CM | POA: Diagnosis present

## 2020-08-17 DIAGNOSIS — M25552 Pain in left hip: Secondary | ICD-10-CM | POA: Diagnosis present

## 2020-08-17 DIAGNOSIS — M25611 Stiffness of right shoulder, not elsewhere classified: Secondary | ICD-10-CM | POA: Diagnosis present

## 2020-08-17 DIAGNOSIS — M25551 Pain in right hip: Secondary | ICD-10-CM | POA: Diagnosis not present

## 2020-08-17 DIAGNOSIS — M5441 Lumbago with sciatica, right side: Secondary | ICD-10-CM | POA: Insufficient documentation
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
## 2020-08-18 ENCOUNTER — Encounter: Payer: Medicare HMO | Admitting: Physical Therapy

## 2020-08-19 ENCOUNTER — Ambulatory Visit: Payer: Medicare HMO | Admitting: Physical Therapy

## 2020-08-19 ENCOUNTER — Other Ambulatory Visit: Payer: Self-pay

## 2020-08-19 ENCOUNTER — Encounter: Payer: Self-pay | Admitting: Physical Therapy

## 2020-08-19 DIAGNOSIS — R252 Cramp and spasm: Secondary | ICD-10-CM | POA: Diagnosis not present

## 2020-08-19 DIAGNOSIS — M6281 Muscle weakness (generalized): Secondary | ICD-10-CM

## 2020-08-19 DIAGNOSIS — M25611 Stiffness of right shoulder, not elsewhere classified: Secondary | ICD-10-CM | POA: Diagnosis not present

## 2020-08-19 DIAGNOSIS — M5441 Lumbago with sciatica, right side: Secondary | ICD-10-CM | POA: Diagnosis not present

## 2020-08-19 DIAGNOSIS — M25552 Pain in left hip: Secondary | ICD-10-CM

## 2020-08-19 DIAGNOSIS — M25551 Pain in right hip: Secondary | ICD-10-CM

## 2020-08-19 NOTE — Patient Instructions (Signed)
Access Code: [email or web address] URL: [email or web address] Date: 08/19/2020 Prepared by: Everardo All  Program Notes Perform for both legs   Exercises Clamshell - 1 x daily - 7 x weekly - 2 sets - 10 reps Standing Hip Abduction with Counter Support - 1 x daily - 7 x weekly - 2 sets - 10 reps Sit to Stand with Arm Reach Toward Target - 1 x daily - 7 x weekly - 2 sets - 5 reps
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
## 2020-08-25 ENCOUNTER — Ambulatory Visit: Payer: Medicare HMO | Admitting: Physical Therapy

## 2020-08-25 ENCOUNTER — Other Ambulatory Visit: Payer: Self-pay

## 2020-08-25 DIAGNOSIS — M25611 Stiffness of right shoulder, not elsewhere classified: Secondary | ICD-10-CM | POA: Diagnosis not present

## 2020-08-25 DIAGNOSIS — M25551 Pain in right hip: Secondary | ICD-10-CM

## 2020-08-25 DIAGNOSIS — M6281 Muscle weakness (generalized): Secondary | ICD-10-CM

## 2020-08-25 DIAGNOSIS — M25552 Pain in left hip: Secondary | ICD-10-CM | POA: Diagnosis not present

## 2020-08-25 DIAGNOSIS — R252 Cramp and spasm: Secondary | ICD-10-CM | POA: Diagnosis not present

## 2020-08-25 DIAGNOSIS — M5441 Lumbago with sciatica, right side: Secondary | ICD-10-CM | POA: Diagnosis not present

## 2020-08-25 NOTE — Patient Instructions (Signed)
Hooklying Gluteal Sets - 1 x daily - 7 x weekly - 2 sets - 10 reps - 3s hold
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
## 2020-08-26 ENCOUNTER — Ambulatory Visit
Admission: RE | Admit: 2020-08-26 | Discharge: 2020-08-26 | Disposition: A | Discharge status: Home / Self Care | Payer: Medicare HMO | Source: Ambulatory Visit | Attending: Family Medicine | Admitting: Family Medicine

## 2020-08-26 DIAGNOSIS — N281 Cyst of kidney, acquired: Secondary | ICD-10-CM | POA: Diagnosis not present

## 2020-08-26 DIAGNOSIS — E278 Other specified disorders of adrenal gland: Secondary | ICD-10-CM | POA: Diagnosis not present

## 2020-08-26 DIAGNOSIS — D3501 Benign neoplasm of right adrenal gland: Secondary | ICD-10-CM

## 2020-08-26 DIAGNOSIS — K760 Fatty (change of) liver, not elsewhere classified: Secondary | ICD-10-CM | POA: Diagnosis not present

## 2020-08-26 DIAGNOSIS — K7689 Other specified diseases of liver: Secondary | ICD-10-CM | POA: Diagnosis not present

## 2020-08-30 NOTE — Progress Notes (Signed)
Ultrasound shows a simple renal cyst.  I do not believe this needs to be followed up further.  I will make sure the Dr. Sharlet Salina is aware of this test result.

## 2020-09-01 ENCOUNTER — Other Ambulatory Visit: Payer: Self-pay

## 2020-09-01 ENCOUNTER — Ambulatory Visit: Payer: Medicare HMO | Admitting: Physical Therapy

## 2020-09-01 ENCOUNTER — Encounter: Payer: Self-pay | Admitting: Physical Therapy

## 2020-09-01 DIAGNOSIS — M6281 Muscle weakness (generalized): Secondary | ICD-10-CM

## 2020-09-01 DIAGNOSIS — M25552 Pain in left hip: Secondary | ICD-10-CM

## 2020-09-01 DIAGNOSIS — M25611 Stiffness of right shoulder, not elsewhere classified: Secondary | ICD-10-CM | POA: Diagnosis not present

## 2020-09-01 DIAGNOSIS — M5441 Lumbago with sciatica, right side: Secondary | ICD-10-CM | POA: Diagnosis not present

## 2020-09-01 DIAGNOSIS — M25551 Pain in right hip: Secondary | ICD-10-CM | POA: Diagnosis not present

## 2020-09-01 DIAGNOSIS — R252 Cramp and spasm: Secondary | ICD-10-CM | POA: Diagnosis not present
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
## 2020-09-06 NOTE — Progress Notes (Signed)
I, Peterson Lombard, LAT, ATC acting as a scribe for [name], MD.  Kelly [name] is a 80 y.o. female who presents to [place] [facility] today for R shoulder pain and lumbar radiculopathy. Additional a renal cyst and adrenal adenoma incidentally noted on MRI of lumbar spine and a renal US was ordered and routed back to pt's PCP/oncologist. Pt was last seen by Dr. Georgina Snell on 08/11/20 and was advised to cont conventional PT of which she's completed 5 visits. She was also c/o R knee pain and neck pain at her last visit.  Today, pt reports that her R shoulder pain has improved a lot and feels that both PT and massage therapy has helped.  She states that her walking is better until her recent move which has increased her activity level so her back and walking is less improved.  She would also like to discuss her renal US results too.  Dx imaging: 08/26/20 Renal US  08/08/20 R shoulder & L-spine MRI             07/30/20 L-spine & c-spine XR             07/06/20 R shoulder XR, pelvis XR, & L-spine XR  Pertinent review of systems: No fevers or chills  Relevant historical information: History of breast cancer   Exam:  BP 120/80 (BP Location: Left Arm, Patient Position: Sitting, Cuff Size: Normal)   Pulse 91   Ht 5\' 7"  (1.702 m)   Wt 212 lb 9.6 oz (96.4 kg)   SpO2 95%   BMI 33.30 kg/m  General: Well Developed, well nourished, and in no acute distress.   MSK: Right shoulder normal motion  L-spine normal motion normal gait.    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  Follow-up ultrasound  EXAM: RENAL / URINARY TRACT ULTRASOUND COMPLETE  COMPARISON:  MRI lumbar spine 08/08/2020  FINDINGS: Right Kidney:  Renal measurements: 11.2 x 3.3 x 5.1 cm = volume: 98 mL. Echogenicity within normal limits. No mass or hydronephrosis visualized. 4.1 cm simple  cyst seen in the lower pole of the right kidney corresponds to the abnormality seen on recent MRI.  Left Kidney:  Renal measurements: 11.1 x 5.1 x 5.1 cm = volume: 152 mL. Echogenicity within normal limits. No mass or hydronephrosis visualized.  Bladder:  Appears normal for degree of bladder distention.  Other:  Left adrenal gland not well visualized on this exam.  Diffuse increased echogenicity of the visualized portions of the hepatic parenchyma are a nonspecific indicator of hepatocellular dysfunction, most commonly steatosis.  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   On: 08/27/2020 14:06    Assessment and Plan: 80 y.o. female with  Right shoulder pain improved with physical therapy.  Continue home exercise program.  Right lumbar radiculopathy improving with PT and massage therapy.  If needed can proceed with epidural steroid injection.  Patient will let me know.  Spent quite a bit of time today discussing her incidental findings on MRI and follow-up renal ultrasound.  The simple cyst I believe can be treated with watchful waiting at this point.  The incidental adrenal adenoma seen on MRI was not well visualized on ultrasound.  I do not think this needs much further work-up however certainly her primary care provider would be  in a much better position to determine further action from here.  My understanding is next step may need CT scan if needed.  She has a follow-up appointment with Dr. Sharlet Salina this week or she certainly could discuss this further.  Check back with me as needed.     Discussed warning signs or symptoms. Please see discharge instructions. Patient expresses understanding.   The above documentation has been reviewed and is accurate and complete [name], M.D.  Total encounter time 20 minutes including face-to-face time with the patient and, reviewing past medical record, and charting  on the date of service.   Treatment plan options and findings

## 2020-09-07 ENCOUNTER — Encounter: Payer: Self-pay | Admitting: Physical Therapy

## 2020-09-07 ENCOUNTER — Other Ambulatory Visit: Payer: Self-pay

## 2020-09-07 ENCOUNTER — Encounter: Payer: Self-pay | Admitting: Family Medicine

## 2020-09-07 ENCOUNTER — Ambulatory Visit: Payer: Medicare HMO | Admitting: Family Medicine

## 2020-09-07 ENCOUNTER — Ambulatory Visit: Payer: Medicare HMO | Admitting: Physical Therapy

## 2020-09-07 VITALS — BP 120/80 | HR 91 | Ht 67.0 in | Wt 212.6 lb

## 2020-09-07 DIAGNOSIS — M25551 Pain in right hip: Secondary | ICD-10-CM | POA: Diagnosis not present

## 2020-09-07 DIAGNOSIS — M25511 Pain in right shoulder: Secondary | ICD-10-CM | POA: Diagnosis not present

## 2020-09-07 DIAGNOSIS — M25611 Stiffness of right shoulder, not elsewhere classified: Secondary | ICD-10-CM

## 2020-09-07 DIAGNOSIS — D3502 Benign neoplasm of left adrenal gland: Secondary | ICD-10-CM

## 2020-09-07 DIAGNOSIS — N281 Cyst of kidney, acquired: Secondary | ICD-10-CM | POA: Diagnosis not present

## 2020-09-07 DIAGNOSIS — M79604 Pain in right leg: Secondary | ICD-10-CM | POA: Diagnosis not present

## 2020-09-07 DIAGNOSIS — M25552 Pain in left hip: Secondary | ICD-10-CM

## 2020-09-07 DIAGNOSIS — M5441 Lumbago with sciatica, right side: Secondary | ICD-10-CM

## 2020-09-07 DIAGNOSIS — R252 Cramp and spasm: Secondary | ICD-10-CM | POA: Diagnosis not present

## 2020-09-07 DIAGNOSIS — M6281 Muscle weakness (generalized): Secondary | ICD-10-CM

## 2020-09-07 NOTE — Patient Instructions (Addendum)
Thank you for coming in today.  Continue current treatment for the back and the shoulder.   Let me know if you want to do a back injection.   As for the adrenal gland seen on the MRI Dr Sharlet Salina can chase that down further if needed.   There are more tests to do.   Keep me updated. Recheck as needed.
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
## 2020-09-09 ENCOUNTER — Ambulatory Visit: Payer: Medicare HMO | Admitting: Physical Therapy

## 2020-09-09 ENCOUNTER — Other Ambulatory Visit: Payer: Self-pay

## 2020-09-09 DIAGNOSIS — M5441 Lumbago with sciatica, right side: Secondary | ICD-10-CM | POA: Diagnosis not present

## 2020-09-09 DIAGNOSIS — M25551 Pain in right hip: Secondary | ICD-10-CM

## 2020-09-09 DIAGNOSIS — M25611 Stiffness of right shoulder, not elsewhere classified: Secondary | ICD-10-CM | POA: Diagnosis not present

## 2020-09-09 DIAGNOSIS — R252 Cramp and spasm: Secondary | ICD-10-CM

## 2020-09-09 DIAGNOSIS — M25552 Pain in left hip: Secondary | ICD-10-CM

## 2020-09-09 DIAGNOSIS — M6281 Muscle weakness (generalized): Secondary | ICD-10-CM
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
## 2020-09-10 ENCOUNTER — Encounter: Payer: Self-pay | Admitting: Internal Medicine

## 2020-09-10 ENCOUNTER — Ambulatory Visit (INDEPENDENT_AMBULATORY_CARE_PROVIDER_SITE_OTHER): Payer: Medicare HMO | Admitting: Internal Medicine

## 2020-09-10 VITALS — BP 124/80 | HR 84 | Temp 98.8°F | Resp 18 | Ht 67.0 in | Wt 209.2 lb

## 2020-09-10 DIAGNOSIS — Z0000 Encounter for general adult medical examination without abnormal findings: Secondary | ICD-10-CM

## 2020-09-10 DIAGNOSIS — Z9989 Dependence on other enabling machines and devices: Secondary | ICD-10-CM

## 2020-09-10 DIAGNOSIS — K760 Fatty (change of) liver, not elsewhere classified: Secondary | ICD-10-CM

## 2020-09-10 DIAGNOSIS — C50411 Malignant neoplasm of upper-outer quadrant of right female breast: Secondary | ICD-10-CM

## 2020-09-10 DIAGNOSIS — D3500 Benign neoplasm of unspecified adrenal gland: Secondary | ICD-10-CM

## 2020-09-10 DIAGNOSIS — Z170 Estrogen receptor positive status [ER+]: Secondary | ICD-10-CM

## 2020-09-10 DIAGNOSIS — E785 Hyperlipidemia, unspecified: Secondary | ICD-10-CM

## 2020-09-10 DIAGNOSIS — G4733 Obstructive sleep apnea (adult) (pediatric): Secondary | ICD-10-CM | POA: Diagnosis not present

## 2020-09-10 HISTORY — DX: Benign neoplasm of unspecified adrenal gland: D35.00

## 2020-09-10 HISTORY — DX: Fatty (change of) liver, not elsewhere classified: K76.0

## 2020-09-10 NOTE — Assessment & Plan Note (Signed)
Still using CPAP 

## 2020-09-10 NOTE — Assessment & Plan Note (Signed)
Detected on US renal, checking CMP and hep c and US abdomen complete to assess.

## 2020-09-10 NOTE — Patient Instructions (Addendum)
We will get the ultrasound to look at the liver and the adrenal glands.   Come back fasting for the labs.   Health Maintenance, Female Adopting a healthy lifestyle and getting preventive care are important in promoting health and wellness. Ask your health care provider about:  The right schedule for you to have regular tests and exams.  Things you can do on your own to prevent diseases and keep yourself healthy. What should I know about diet, weight, and exercise? Eat a healthy diet  Eat a diet that includes plenty of vegetables, fruits, low-fat dairy products, and lean protein.  Do not eat a lot of foods that are high in solid fats, added sugars, or sodium.   Maintain a healthy weight Body mass index (BMI) is used to identify weight problems. It estimates body fat based on height and weight. Your health care provider can help determine your BMI and help you achieve or maintain a healthy weight. Get regular exercise Get regular exercise. This is one of the most important things you can do for your health. Most adults should:  Exercise for at least 150 minutes each week. The exercise should increase your heart rate and make you sweat (moderate-intensity exercise).  Do strengthening exercises at least twice a week. This is in addition to the moderate-intensity exercise.  Spend less time sitting. Even light physical activity can be beneficial. Watch cholesterol and blood lipids Have your blood tested for lipids and cholesterol at 78 years of age, then have this test every 5 years. Have your cholesterol levels checked more often if:  Your lipid or cholesterol levels are high.  You are older than 78 years of age.  You are at high risk for heart disease. What should I know about cancer screening? Depending on your health history and family history, you may need to have cancer screening at various ages. This may include screening for:  Breast cancer.  Cervical cancer.  Colorectal  cancer.  Skin cancer.  Lung cancer. What should I know about heart disease, diabetes, and high blood pressure? Blood pressure and heart disease  High blood pressure causes heart disease and increases the risk of stroke. This is more likely to develop in people who have high blood pressure readings, are of African descent, or are overweight.  Have your blood pressure checked: ? Every 3-5 years if you are 9-73 years of age. ? Every year if you are 85 years old or older. Diabetes Have regular diabetes screenings. This checks your fasting blood sugar level. Have the screening done:  Once every three years after age 14 if you are at a normal weight and have a low risk for diabetes.  More often and at a younger age if you are overweight or have a high risk for diabetes. What should I know about preventing infection? Hepatitis B If you have a higher risk for hepatitis B, you should be screened for this virus. Talk with your health care provider to find out if you are at risk for hepatitis B infection. Hepatitis C Testing is recommended for:  Everyone born from [birth year] through 1963.  Anyone with known risk factors for hepatitis C. Sexually transmitted infections (STIs)  Get screened for STIs, including gonorrhea and chlamydia, if: ? You are sexually active and are younger than 78 years of age. ? You are older than 78 years of age and your health care provider tells you that you are at risk for this type of infection. ? Your  sexual activity has changed since you were last screened, and you are at increased risk for chlamydia or gonorrhea. Ask your health care provider if you are at risk.  Ask your health care provider about whether you are at high risk for HIV. Your health care provider may recommend a prescription medicine to help prevent HIV infection. If you choose to take medicine to prevent HIV, you should first get tested for HIV. You should then be tested every 3 months for as long as  you are taking the medicine. Pregnancy  If you are about to stop having your period (premenopausal) and you may become pregnant, seek counseling before you get pregnant.  Take 400 to 800 micrograms (mcg) of folic acid every day if you become pregnant.  Ask for birth control (contraception) if you want to prevent pregnancy. Osteoporosis and menopause Osteoporosis is a disease in which the bones lose minerals and strength with aging. This can result in bone fractures. If you are 30 years old or older, or if you are at risk for osteoporosis and fractures, ask your health care provider if you should:  Be screened for bone loss.  Take a calcium or vitamin D supplement to lower your risk of fractures.  Be given hormone replacement therapy (HRT) to treat symptoms of menopause. Follow these instructions at home: Lifestyle  Do not use any products that contain nicotine or tobacco, such as cigarettes, e-cigarettes, and chewing tobacco. If you need help quitting, ask your health care provider.  Do not use street drugs.  Do not share needles.  Ask your health care provider for help if you need support or information about quitting drugs. Alcohol use  Do not drink alcohol if: ? Your health care provider tells you not to drink. ? You are pregnant, may be pregnant, or are planning to become pregnant.  If you drink alcohol: ? Limit how much you use to 0-1 drink a day. ? Limit intake if you are breastfeeding.  Be aware of how much alcohol is in your drink. In the [place] one drink equals one 12 oz bottle of beer (355 mL), one 5 oz glass of wine (148 mL), or one 1 oz glass of hard liquor (44 mL). General instructions  Schedule regular health, dental, and eye exams.  Stay current with your vaccines.  Tell your health care provider if: ? You often feel depressed. ? You have ever been abused or do not feel safe at home. Summary  Adopting a healthy lifestyle and getting preventive care are  important in promoting health and wellness.  Follow your health care provider's instructions about healthy diet, exercising, and getting tested or screened for diseases.  Follow your health care provider's instructions on monitoring your cholesterol and blood pressure. This information is not intended to replace advice given to you by your health care provider. Make sure you discuss any questions you have with your health care provider. Document Revised: 04/24/2018 Document Reviewed: 04/24/2018 Elsevier Patient Education  2021 [employer].

## 2020-09-10 NOTE — Assessment & Plan Note (Signed)
Checking lipid panel and adjust lipitor 40 mg daily as needed. 

## 2020-09-10 NOTE — Progress Notes (Signed)
Subjective:   Patient ID: Kelly [name], female    DOB: 1940-07-28, 80 y.o.   MRN: [id number]  HPI Here for medicare wellness, no new complaints. Please see A/P for status and treatment of chronic medical problems.   Diet: heart healthy Physical activity: sedentary Depression/mood screen: negative Hearing: intact to whispered voice with bilateral aids Visual acuity: grossly normal, cataract surgery soon, performs annual eye exam  ADLs: capable Fall risk: none Home safety: good Cognitive evaluation: intact to orientation, naming, recall and repetition EOL planning: adv directives discussed  [name] Office Visit from 09/10/2020 in [place] at [facility] Total Score 0      I have personally reviewed and have noted 1. The patient's medical and social history - reviewed today no changes 2. Their use of alcohol, tobacco or illicit drugs 3. Their current medications and supplements 4. The patient's functional ability including ADL's, fall risks, home safety risks and hearing or visual impairment. 5. Diet and physical activities 6. Evidence for depression or mood disorders 7. Care team reviewed and updated 8.  The patient is not on an opioid pain medication.  Patient Care Team: Hoyt Koch, MD as PCP - General (Internal Medicine) Rockwell Germany, RN as Oncology Nurse Navigator Mauro Kaufmann, RN as Oncology Nurse Navigator [name], MD as Consulting Physician (General Surgery) Magrinat, Virgie Dad, MD as Consulting Physician (Oncology) Kyung Rudd, MD as Consulting Physician (Radiation Oncology) Lyndal Pulley, DO as Consulting Physician (Sports Medicine) Key, Nelia Shi, NP as Nurse Practitioner (Gynecology) Past Medical History:  Diagnosis Date  . Allergy   . Blood transfusion without reported diagnosis   . Breast cancer ([place])   . Breast mass    fibocystic breast dx  . Cataract   . Family history of colon cancer 04/15/2020  .  Family history of pancreatic cancer 04/15/2020  . Family history of prostate cancer 04/15/2020  . Hepatitis    CMV 1992  . Hyperlipidemia   . Postmenopausal HRT (hormone replacement therapy)   . Scarlet fever as a teen  . Sleep apnea    Past Surgical History:  Procedure Laterality Date  . APPENDECTOMY    . BREAST LUMPECTOMY WITH RADIOACTIVE SEED LOCALIZATION Right 04/27/2020   Procedure: RIGHT BREAST LUMPECTOMY WITH RADIOACTIVE SEED LOCALIZATION;  Surgeon: [name], MD;  Location: [place];  Service: General;  Laterality: Right;  . BUNIONECTOMY    . caesarean section    . COLONOSCOPY    . CORRECTION HAMMER TOE    . FOOT TENDON SURGERY     left   . POLYPECTOMY    . TONSILLECTOMY    . TONSILLECTOMY     Family History  Problem Relation Age of Onset  . Pancreatic cancer Mother 59  . Hyperlipidemia Mother   . Hypertension Mother   . Polymyalgia rheumatica Mother   . Dementia Father   . Basal cell carcinoma Father        dx after 50, sun exposure  . Ovarian cancer Paternal Aunt        dx 62s  . Ovarian cancer Paternal Grandmother        d. early 38s  . Colon cancer Paternal Grandfather        dx early 5s  . Liver cancer Maternal Uncle        dx 62s  . Prostate cancer Maternal Uncle        dx >50  . Leukemia Cousin 13  maternal cousin    Review of Systems  Constitutional: Negative.   HENT: Negative.   Eyes: Negative.   Respiratory: Negative for cough, chest tightness and shortness of breath.   Cardiovascular: Negative for chest pain, palpitations and leg swelling.  Gastrointestinal: Negative for abdominal distention, abdominal pain, constipation, diarrhea, nausea and vomiting.  Musculoskeletal: Positive for arthralgias.  Skin: Negative.   Neurological: Negative.   Psychiatric/Behavioral: Negative.     Objective:  Physical Exam Constitutional:      Appearance: She is well-developed.  HENT:     Head: Normocephalic and atraumatic.  Cardiovascular:      Rate and Rhythm: Normal rate and regular rhythm.  Pulmonary:     Effort: Pulmonary effort is normal. No respiratory distress.     Breath sounds: Normal breath sounds. No wheezing or rales.  Abdominal:     General: Bowel sounds are normal. There is no distension.     Palpations: Abdomen is soft.     Tenderness: There is no abdominal tenderness. There is no rebound.  Musculoskeletal:        General: Tenderness present.     Cervical back: Normal range of motion.  Skin:    General: Skin is warm and dry.  Neurological:     Mental Status: She is alert and oriented to person, place, and time.     Coordination: Coordination normal.     Vitals:   09/10/20 0959  BP: 124/80  Pulse: 84  Resp: 18  Temp: 98.8 F (37.1 C)  TempSrc: Oral  SpO2: 98%  Weight: 209 lb 3.2 oz (94.9 kg)  Height: 5\' 7"  (1.702 m)   This visit occurred during the SARS-CoV-2 public health emergency.  Safety protocols were in place, including screening questions prior to the visit, additional usage of staff PPE, and extensive cleaning of exam room while observing appropriate contact time as indicated for disinfecting solutions.   Assessment & Plan:

## 2020-09-10 NOTE — Assessment & Plan Note (Signed)
Checking [place] to assess. No symptoms of this being hormonally active.

## 2020-09-10 NOTE — Assessment & Plan Note (Signed)
Still taking arimidex.

## 2020-09-10 NOTE — Assessment & Plan Note (Signed)
Flu shot yearly. Pneumonia complete. Shingrix counseled to get at pharmacy. Tetanus due 2023. Colonoscopy aged out future. Mammogram yearly, pap smear aged out and dexa due 2025. Counseled about sun safety and mole surveillance. Counseled about the dangers of distracted driving. Given 10 year screening recommendations.

## 2020-09-14 ENCOUNTER — Other Ambulatory Visit (INDEPENDENT_AMBULATORY_CARE_PROVIDER_SITE_OTHER): Payer: Medicare HMO

## 2020-09-14 ENCOUNTER — Other Ambulatory Visit: Payer: Self-pay

## 2020-09-14 ENCOUNTER — Ambulatory Visit: Payer: Medicare HMO | Attending: Family Medicine | Admitting: Physical Therapy

## 2020-09-14 DIAGNOSIS — M6281 Muscle weakness (generalized): Secondary | ICD-10-CM | POA: Diagnosis not present

## 2020-09-14 DIAGNOSIS — E785 Hyperlipidemia, unspecified: Secondary | ICD-10-CM

## 2020-09-14 DIAGNOSIS — M25552 Pain in left hip: Secondary | ICD-10-CM | POA: Diagnosis not present

## 2020-09-14 DIAGNOSIS — M25551 Pain in right hip: Secondary | ICD-10-CM

## 2020-09-14 DIAGNOSIS — M25611 Stiffness of right shoulder, not elsewhere classified: Secondary | ICD-10-CM

## 2020-09-14 DIAGNOSIS — K760 Fatty (change of) liver, not elsewhere classified: Secondary | ICD-10-CM | POA: Diagnosis not present

## 2020-09-14 DIAGNOSIS — M5441 Lumbago with sciatica, right side: Secondary | ICD-10-CM | POA: Diagnosis not present

## 2020-09-14 DIAGNOSIS — R252 Cramp and spasm: Secondary | ICD-10-CM | POA: Insufficient documentation

## 2020-09-14 LAB — COMPREHENSIVE METABOLIC PANEL
ALT: 14 U/L (ref 0–35)
AST: 15 U/L (ref 0–37)
Albumin: 4.1 g/dL (ref 3.5–5.2)
Alkaline Phosphatase: 59 U/L (ref 39–117)
BUN: 19 mg/dL (ref 6–23)
CO2: 31 mEq/L (ref 19–32)
Calcium: 9.6 mg/dL (ref 8.4–10.5)
Chloride: 103 mEq/L (ref 96–112)
Creatinine, Ser: 0.86 mg/dL (ref 0.40–1.20)
GFR: 64.9 mL/min (ref >60.00)
Glucose, Bld: 108 mg/dL — ABNORMAL HIGH (ref 70–99)
Potassium: 4.2 mEq/L (ref 3.5–5.1)
Sodium: 140 mEq/L (ref 135–145)
Total Bilirubin: 0.6 mg/dL (ref 0.2–1.2)
Total Protein: 6.9 g/dL (ref 6.0–8.3)

## 2020-09-14 LAB — CBC
HCT: 39.9 % (ref 36.0–46.0)
Hemoglobin: 13.4 g/dL (ref 12.0–15.0)
MCHC: 33.7 g/dL (ref 30.0–36.0)
MCV: 87.6 fl (ref 78.0–100.0)
Platelets: 331 10*3/uL (ref 150.0–400.0)
RBC: 4.56 Mil/uL (ref 3.87–5.11)
RDW: 13 % (ref 11.5–15.5)
WBC: 5.5 10*3/uL (ref 4.0–10.5)

## 2020-09-14 LAB — LIPID PANEL
Cholesterol: 167 mg/dL (ref 0–200)
HDL: 42.4 mg/dL (ref >39.00)
NonHDL: 124.94
Total CHOL/HDL Ratio: 4
Triglycerides: 226 mg/dL — ABNORMAL HIGH (ref 0.0–149.0)
VLDL: 45.2 mg/dL — ABNORMAL HIGH (ref 0.0–40.0)

## 2020-09-14 LAB — LDL CHOLESTEROL, DIRECT: Direct LDL: 93 mg/dL

## 2020-09-14 LAB — HEMOGLOBIN A1C: Hgb A1c MFr Bld: 6.5 % (ref 4.6–6.5)
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
## 2020-09-15 LAB — HEPATITIS C ANTIBODY
Hepatitis C Ab: NONREACTIVE
SIGNAL TO CUT-OFF: 0.01 (ref <1.00)

## 2020-09-16 ENCOUNTER — Ambulatory Visit: Payer: Medicare HMO | Admitting: Physical Therapy

## 2020-09-16 ENCOUNTER — Other Ambulatory Visit: Payer: Self-pay

## 2020-09-16 DIAGNOSIS — R252 Cramp and spasm: Secondary | ICD-10-CM | POA: Diagnosis not present

## 2020-09-16 DIAGNOSIS — M25551 Pain in right hip: Secondary | ICD-10-CM

## 2020-09-16 DIAGNOSIS — M25611 Stiffness of right shoulder, not elsewhere classified: Secondary | ICD-10-CM

## 2020-09-16 DIAGNOSIS — M5441 Lumbago with sciatica, right side: Secondary | ICD-10-CM

## 2020-09-16 DIAGNOSIS — M25552 Pain in left hip: Secondary | ICD-10-CM

## 2020-09-16 DIAGNOSIS — M6281 Muscle weakness (generalized): Secondary | ICD-10-CM

## 2020-09-16 NOTE — Patient Instructions (Signed)
Clamshell with Resistance - 1 x daily - 7 x weekly - 2 sets - 10 reps Shoulder External Rotation and Scapular Retraction with Resistance - 1 x daily - 7 x weekly - 2 sets - 10 reps Standing Row with Anchored Resistance - 1 x daily - 7 x weekly - 2 sets - 10 reps
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
## 2020-09-20 ENCOUNTER — Ambulatory Visit: Payer: Medicare HMO

## 2020-09-20 ENCOUNTER — Other Ambulatory Visit: Payer: Self-pay

## 2020-09-20 DIAGNOSIS — M6281 Muscle weakness (generalized): Secondary | ICD-10-CM | POA: Diagnosis not present

## 2020-09-20 DIAGNOSIS — M25552 Pain in left hip: Secondary | ICD-10-CM

## 2020-09-20 DIAGNOSIS — R252 Cramp and spasm: Secondary | ICD-10-CM | POA: Diagnosis not present

## 2020-09-20 DIAGNOSIS — M25551 Pain in right hip: Secondary | ICD-10-CM | POA: Diagnosis not present

## 2020-09-20 DIAGNOSIS — M25611 Stiffness of right shoulder, not elsewhere classified: Secondary | ICD-10-CM | POA: Diagnosis not present

## 2020-09-20 DIAGNOSIS — M5441 Lumbago with sciatica, right side: Secondary | ICD-10-CM | POA: Diagnosis not present

## 2020-09-20 NOTE — Patient Instructions (Signed)
Access Code: [email or web address] URL: [email or web address] Date: 09/20/2020 Prepared by: Claiborne Billings  Program Notes  Seated Piriformis Stretch with Trunk Bend - 3 x daily - 7 x weekly - 1 sets - 3 reps - 20 hold

## 2020-09-20 NOTE — Therapy (Signed)
[facility] [address] [place], [place], [address] Phone: [phone]   Fax:  [phone]  Physical Therapy Treatment  Patient Details  Name: Kelly [name] MRN: [id number] Date of Birth: [birth year] Referring Provider (PT): [name], MD   Encounter Date: 09/20/2020   PT End of Session - 09/20/20 1147    Visit Number 9    Date for PT Re-Evaluation 11/09/20    Authorization Type [employer]na Medicare    Progress Note Due on Visit 10    PT Start Time 1102    PT Stop Time 1145    PT Time Calculation (min) 43 min    Activity Tolerance Patient tolerated treatment well;No increased pain    Behavior During Therapy WFL for tasks assessed/performed           Past Medical History:  Diagnosis Date  . Allergy   . Blood transfusion without reported diagnosis   . Breast cancer ([place])   . Breast mass    fibocystic breast dx  . Cataract   . Family history of colon cancer 04/15/2020  . Family history of pancreatic cancer 04/15/2020  . Family history of prostate cancer 04/15/2020  . Hepatitis    CMV 1992  . Hyperlipidemia   . Postmenopausal HRT (hormone replacement therapy)   . Scarlet fever as a teen  . Sleep apnea     Past Surgical History:  Procedure Laterality Date  . APPENDECTOMY    . BREAST LUMPECTOMY WITH RADIOACTIVE SEED LOCALIZATION Right 04/27/2020   Procedure: RIGHT BREAST LUMPECTOMY WITH RADIOACTIVE SEED LOCALIZATION;  Surgeon: [name], MD;  Location: [place];  Service: General;  Laterality: Right;  . BUNIONECTOMY    . caesarean section    . COLONOSCOPY    . CORRECTION HAMMER TOE    . FOOT TENDON SURGERY     left   . POLYPECTOMY    . TONSILLECTOMY    . TONSILLECTOMY      There were no vitals filed for this visit.   Subjective Assessment - 09/20/20 1105    Subjective I'm doing well today.  I did well over the weekend.  I bought a ball today for squeezing.    Currently in Pain? No/denies                              [facility] Adult PT Treatment/Exercise - 09/20/20 0001      Neuro Re-ed    Neuro Re-ed Details  partial tandem on black foam; 2 x 30s hold      Lumbar Exercises: Stretches   Active Hamstring Stretch Right;Left;2 reps;20 seconds    Figure 4 Stretch 2 reps;20 seconds      Knee/Hip Exercises: Standing   Hip Abduction Both;AROM;1 set;10 reps    Abduction Limitations verbal and tactile cues for decreased hip external rotation bilaterally and maintaining erect trunk    Hip Extension Stengthening;Both;1 set;10 reps;Knee straight      Knee/Hip Exercises: Seated   Ball Squeeze 10 reps x 5s hold    Sit to [employer] 20 reps   holding kettle bell 5#     Shoulder Exercises: Seated   External Rotation Both;Theraband;20 reps;Strengthening    Other Seated Exercises --      Shoulder Exercises: Standing   Row Both;20 reps    Theraband Level (Shoulder Row) Level 3 (Green)  PT Education - 09/20/20 1124    Education Details Access Code: [email or web address]    Person(s) Educated Patient    Methods Explanation;Demonstration    Comprehension Verbalized understanding;Returned demonstration            PT Short Term Goals - 09/16/20 1103      PT SHORT TERM GOAL #3   Title Patient will demonstrate 125 degrees abduction and IR to L4 of Rt UE for improved reaching and lifting ADLs    Period Weeks    Status On-going   151 degrees abduction; unable to reach behind back   Target Date 09/28/20             PT Long Term Goals - 08/17/20 1505      PT LONG TERM GOAL #1   Title Patient will be independent with advanced HEP for long term management of symptoms post D/C.    Time 12    Period Weeks    Status New    Target Date 11/09/20      PT LONG TERM GOAL #2   Title Patient will demonstrate 150 degrees of Rt shoulder abduction and IR to L1 to more readily complete reaching tasks.    Time 12    Period Weeks    Status New    Target Date 11/09/20       PT LONG TERM GOAL #3   Title Patient will score 50 on FOTO to indicate improved overall mobility.    Time 12    Period Weeks    Status New    Target Date 11/09/20      PT LONG TERM GOAL #4   Title Patient will lift 10# from floor level using proper mechanics x5 reps without increased pain to more readily complete ADLs    Time 12    Period Weeks    Status New    Target Date 11/09/20      PT LONG TERM GOAL #5   Title Patient will raise/lower 3# to overhead shelf without increased pain to more readily complete homemaking tasks.    Time 12    Period Weeks    Status New    Target Date 11/09/20                 Plan - 09/20/20 1146    Clinical Impression Statement Pt arrived without pain today.  Pt bought a ball for seated hip adduction at home.  Pt required tactile and verbal cues for alignment with standing hip exercises and to reduce scapular elevation for UE motion against gravity.  Pt denies any limitation with use of Rt UE for functional tasks and only mild pain with reaching out to the side now.  PT added piriformis stretch today address chronic piriformis tension.  Pt will work to transition to a gym program at [employer] where she was exercising prior to injury.    PT Frequency 2x / week    PT Duration 12 weeks    PT Treatment/Interventions Therapeutic exercise;Patient/family education;ADLs/Self Care Home Management;Manual techniques;Manual lymph drainage;Compression bandaging;Scar mobilization;Passive range of motion;Taping;Therapeutic activities;Functional mobility training;Neuromuscular re-education;Gait training;Balance training;Iontophoresis 4mg /ml Dexamethasone;Dry needling;Spinal Manipulations;Joint Manipulations;Other (comment)    PT Next Visit Plan continue scapular stabiliation and hip strengthening- work to transtion to gym program    PT Home Exercise Plan Access Code: [email or web address]    Consulted and Agree with Plan of Care Patient           Patient will benefit from  skilled therapeutic intervention in  order to improve the following deficits and impairments:  Abnormal gait,Decreased activity tolerance,Decreased balance,Decreased endurance,Decreased mobility,Decreased range of motion,Decreased strength,Difficulty walking,Hypomobility,Increased muscle spasms,Improper body mechanics,Pain,Impaired UE functional use  Visit Diagnosis: Pain in right hip  Cramp and spasm  Pain in left hip  Muscle weakness (generalized)  Stiffness of right shoulder, not elsewhere classified     Problem List Patient Active Problem List   Diagnosis Date Noted  . 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 Hepatic steatosis 09/10/2020  . Genetic testing 04/26/2020  . Family history of pancreatic cancer 04/15/2020  . Family history of ovarian cancer 04/15/2020  . Family history of prostate cancer 04/15/2020  . Family history of colon cancer 04/15/2020  . Malignant neoplasm of upper-outer quadrant of right breast in female, estrogen receptor positive ([place]) 04/05/2020  . Degenerative disc disease, cervical 08/07/2019  . Lumbar radiculopathy 04/11/2017  . 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 OSA on CPAP 12/23/2015  . Urinary incontinence 03/31/2014  . Fibrocystic breast disease 06/06/2011  . Routine health maintenance 11/12/2010  . Obesity 09/11/2007  . Hyperlipidemia 06/10/2007    Sigurd Sos, PT 09/20/20 11:49 AM  [facility] [address], [place], [place], [address] Phone: [phone]   Fax:  [phone]  Name: Kelly [name] MRN: [id number] Date of Birth: 1940-08-19

## 2020-09-21 ENCOUNTER — Ambulatory Visit: Payer: Medicare HMO

## 2020-09-23 ENCOUNTER — Ambulatory Visit: Payer: Medicare HMO | Admitting: Physical Therapy

## 2020-09-23 ENCOUNTER — Other Ambulatory Visit: Payer: Self-pay

## 2020-09-23 DIAGNOSIS — M6281 Muscle weakness (generalized): Secondary | ICD-10-CM | POA: Diagnosis not present

## 2020-09-23 DIAGNOSIS — R252 Cramp and spasm: Secondary | ICD-10-CM | POA: Diagnosis not present

## 2020-09-23 DIAGNOSIS — M25611 Stiffness of right shoulder, not elsewhere classified: Secondary | ICD-10-CM

## 2020-09-23 DIAGNOSIS — M25551 Pain in right hip: Secondary | ICD-10-CM | POA: Diagnosis not present

## 2020-09-23 DIAGNOSIS — M5441 Lumbago with sciatica, right side: Secondary | ICD-10-CM | POA: Diagnosis not present

## 2020-09-23 DIAGNOSIS — M25552 Pain in left hip: Secondary | ICD-10-CM | POA: Diagnosis not present
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
## 2020-09-28 ENCOUNTER — Ambulatory Visit: Payer: Medicare HMO

## 2020-09-28 ENCOUNTER — Other Ambulatory Visit: Payer: Self-pay

## 2020-09-28 DIAGNOSIS — R252 Cramp and spasm: Secondary | ICD-10-CM

## 2020-09-28 DIAGNOSIS — M25611 Stiffness of right shoulder, not elsewhere classified: Secondary | ICD-10-CM | POA: Diagnosis not present

## 2020-09-28 DIAGNOSIS — M6281 Muscle weakness (generalized): Secondary | ICD-10-CM

## 2020-09-28 DIAGNOSIS — M25552 Pain in left hip: Secondary | ICD-10-CM | POA: Diagnosis not present

## 2020-09-28 DIAGNOSIS — M25551 Pain in right hip: Secondary | ICD-10-CM | POA: Diagnosis not present

## 2020-09-28 DIAGNOSIS — M5441 Lumbago with sciatica, right side: Secondary | ICD-10-CM | POA: Diagnosis not present

## 2020-09-28 NOTE — Patient Instructions (Signed)
Access Code: [email or web address] URL: [email or web address] Date: 09/28/2020 Prepared by: Claiborne Billings  Seated Hip Abduction with Resistance - 1 x daily - 7 x weekly - 2 sets - 10 reps Hip Extension with Resistance Loop - 1 x daily - 7 x weekly - 2 sets - 5 reps

## 2020-09-28 NOTE — Therapy (Signed)
[facility] [address] [place], [place], [address] Phone: [phone]   Fax:  [phone]  Physical Therapy Treatment  Patient Details  Name: Kelly [name] MRN: [id number] Date of Birth: 21-May-1940 Referring Provider (PT): [name], MD   Encounter Date: 09/28/2020   PT End of Session - 09/28/20 1016    Visit Number 11    Date for PT Re-Evaluation 11/09/20    Authorization Type Aetna Medicare    Authorization - Visit Number 11    Authorization - Number of Visits 24    PT Start Time 0930    PT Stop Time 0093    PT Time Calculation (min) 45 min    Activity Tolerance Patient tolerated treatment well;No increased pain    Behavior During Therapy WFL for tasks assessed/performed           Past Medical History:  Diagnosis Date  . Allergy   . Blood transfusion without reported diagnosis   . Breast cancer ([place])   . Breast mass    fibocystic breast dx  . Cataract   . Family history of colon cancer 04/15/2020  . Family history of pancreatic cancer 04/15/2020  . Family history of prostate cancer 04/15/2020  . Hepatitis    CMV 1992  . Hyperlipidemia   . Postmenopausal HRT (hormone replacement therapy)   . Scarlet fever as a teen  . Sleep apnea     Past Surgical History:  Procedure Laterality Date  . APPENDECTOMY    . BREAST LUMPECTOMY WITH RADIOACTIVE SEED LOCALIZATION Right 04/27/2020   Procedure: RIGHT BREAST LUMPECTOMY WITH RADIOACTIVE SEED LOCALIZATION;  Surgeon: [name], MD;  Location: [facility];  Service: General;  Laterality: Right;  . BUNIONECTOMY    . caesarean section    . COLONOSCOPY    . CORRECTION HAMMER TOE    . FOOT TENDON SURGERY     left   . POLYPECTOMY    . TONSILLECTOMY    . TONSILLECTOMY      There were no vitals filed for this visit.   Subjective Assessment - 09/28/20 0932    Subjective I went to the gym last week.  I walked a lot of laps and I might have done too many.     Currently in Pain? No/denies                             [facility] Adult PT Treatment/Exercise - 09/28/20 0001      Exercises   Exercises Knee/Hip      Lumbar Exercises: Stretches   Active Hamstring Stretch Right;Left;2 reps;20 seconds    Piriformis Stretch Right;2 reps;20 seconds      Knee/Hip Exercises: Standing   Hip Abduction Both;AROM;2 sets;5 reps;Knee straight    Abduction Limitations yellow loop around ankles    Hip Extension Both;2 sets;5 sets;Knee straight    Extension Limitations yellow loop around ankles      Knee/Hip Exercises: Seated   Ball Squeeze 10 reps x 5s hold    Clamshell with TheraBand Red;Yellow;Green   2 x 10 repetitions- pt brought her loop from home   Sit to [employer] 20 reps   seated on foam pad     Shoulder Exercises: Seated   External Rotation Both;Theraband;20 reps;Strengthening    External Rotation Limitations using yellow loop-pt's loop      Shoulder Exercises: Standing   Row Both;20 reps    Theraband Level (Shoulder Row) Level  3 Nyoka Cowden)                  PT Education - 09/28/20 1005    Education Details Access Code: [email or web address]    Person(s) Educated Patient    Methods Explanation;Demonstration;Handout    Comprehension Verbalized understanding;Returned demonstration            PT Short Term Goals - 09/23/20 1422      PT SHORT TERM GOAL #1   Title Patient will be independent with HEP for continued progression at home.    Time 6    Period Weeks    Status Achieved    Target Date 09/28/20      PT SHORT TERM GOAL #2   Title Patient will demonstrate 4/5 Rt glute med and glute max strength for improved functional transfers    Time 6    Period Weeks    Status Partially Met   glue med 4/5; glute max 3+/5   Target Date 09/28/20      PT SHORT TERM GOAL #3   Title Patient will demonstrate 125 degrees abduction and IR to L4 of Rt UE for improved reaching and lifting ADLs    Time 6    Period Weeks    Status On-going    90 abduction; IR to sacrum   Target Date 09/28/20             PT Long Term Goals - 09/23/20 1627      PT LONG TERM GOAL #3   Title Patient will score 50 on FOTO to indicate improved overall mobility.    Baseline 33    Time 12    Period Weeks    Status Achieved   55                Plan - 09/28/20 1009    Clinical Impression Statement Pt has returned to the gym and reports some soreness due to this. Pt bought some resistive loops that she bought and wants to know how to use them.  Pt instructed in exercises to do with these at home.  These exercises included shoulder ER, seated clam, standing hip extension and abduction with band. Pt required tactile and verbal cues for alignment and speed of movement.  Rt shoulder AROM improved though abduction significantly decreased as compared to previous session. Would benefit from continued skilled intervention for improved functional mobility and improved functional use of Rt UE.    PT Frequency 2x / week    PT Duration 12 weeks    PT Treatment/Interventions Therapeutic exercise;Patient/family education;ADLs/Self Care Home Management;Manual techniques;Manual lymph drainage;Compression bandaging;Scar mobilization;Passive range of motion;Taping;Therapeutic activities;Functional mobility training;Neuromuscular re-education;Gait training;Balance training;Iontophoresis 31m/ml Dexamethasone;Dry needling;Spinal Manipulations;Joint Manipulations;Other (comment)    PT Next Visit Plan scapular stabilization and bil LE strengthening    PT Home Exercise Plan Access Code: [email or web address]   Consulted and Agree with Plan of Care Patient           Patient will benefit from skilled therapeutic intervention in order to improve the following deficits and impairments:  Abnormal gait,Decreased activity tolerance,Decreased balance,Decreased endurance,Decreased mobility,Decreased range of motion,Decreased strength,Difficulty walking,Hypomobility,Increased muscle  spasms,Improper body mechanics,Pain,Impaired UE functional use  Visit Diagnosis: Pain in right hip  Cramp and spasm  Pain in left hip  Muscle weakness (generalized)  Stiffness of right shoulder, not elsewhere classified     Problem List Patient Active Problem List   Diagnosis Date Noted  . 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 Hepatic steatosis 09/10/2020  .  Genetic testing 04/26/2020  . Family history of pancreatic cancer 04/15/2020  . Family history of ovarian cancer 04/15/2020  . Family history of prostate cancer 04/15/2020  . Family history of colon cancer 04/15/2020  . Malignant neoplasm of upper-outer quadrant of right breast in female, estrogen receptor positive ([place]) 04/05/2020  . Degenerative disc disease, cervical 08/07/2019  . Lumbar radiculopathy 04/11/2017  . 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 OSA on CPAP 12/23/2015  . Urinary incontinence 03/31/2014  . Fibrocystic breast disease 06/06/2011  . Routine health maintenance 11/12/2010  . Obesity 09/11/2007  . Hyperlipidemia 06/10/2007     Sigurd Sos, PT 09/28/20 10:18 AM  [facility] [address], [place], [place], [address] Phone: [phone]   Fax:  [phone]  Name: Kelly [name] MRN: [id number] Date of Birth: 1940/08/24

## 2020-09-30 ENCOUNTER — Ambulatory Visit: Payer: Medicare HMO

## 2020-09-30 ENCOUNTER — Other Ambulatory Visit: Payer: Self-pay

## 2020-09-30 DIAGNOSIS — M5441 Lumbago with sciatica, right side: Secondary | ICD-10-CM | POA: Diagnosis not present

## 2020-09-30 DIAGNOSIS — M25551 Pain in right hip: Secondary | ICD-10-CM

## 2020-09-30 DIAGNOSIS — M6281 Muscle weakness (generalized): Secondary | ICD-10-CM | POA: Diagnosis not present

## 2020-09-30 DIAGNOSIS — M25552 Pain in left hip: Secondary | ICD-10-CM | POA: Diagnosis not present

## 2020-09-30 DIAGNOSIS — R252 Cramp and spasm: Secondary | ICD-10-CM

## 2020-09-30 DIAGNOSIS — M25611 Stiffness of right shoulder, not elsewhere classified: Secondary | ICD-10-CM

## 2020-09-30 NOTE — Therapy (Signed)
[facility] [address] [place], [place], [address] Phone: [phone]   Fax:  [phone]  Physical Therapy Treatment  Patient Details  Name: Kelly [name] MRN: [id number] Date of Birth: 10-16-1940 Referring Provider (PT): [name], MD   Encounter Date: 09/30/2020   PT End of Session - 09/30/20 1014    Visit Number 12    Date for PT Re-Evaluation 11/09/20    Authorization Type [employer] Medicare    Authorization - Visit Number 12    Authorization - Number of Visits 24    Progress Note Due on Visit 20    PT Start Time 0932    PT Stop Time 1016    PT Time Calculation (min) 44 min    Activity Tolerance Patient tolerated treatment well;No increased pain    Behavior During Therapy WFL for tasks assessed/performed           Past Medical History:  Diagnosis Date  . Allergy   . Blood transfusion without reported diagnosis   . Breast cancer ([place])   . Breast mass    fibocystic breast dx  . Cataract   . Family history of colon cancer 04/15/2020  . Family history of pancreatic cancer 04/15/2020  . Family history of prostate cancer 04/15/2020  . Hepatitis    CMV 1992  . Hyperlipidemia   . Postmenopausal HRT (hormone replacement therapy)   . Scarlet fever as a teen  . Sleep apnea     Past Surgical History:  Procedure Laterality Date  . APPENDECTOMY    . BREAST LUMPECTOMY WITH RADIOACTIVE SEED LOCALIZATION Right 04/27/2020   Procedure: RIGHT BREAST LUMPECTOMY WITH RADIOACTIVE SEED LOCALIZATION;  Surgeon: [name], MD;  Location: [facility];  Service: General;  Laterality: Right;  . BUNIONECTOMY    . caesarean section    . COLONOSCOPY    . CORRECTION HAMMER TOE    . FOOT TENDON SURGERY     left   . POLYPECTOMY    . TONSILLECTOMY    . TONSILLECTOMY      There were no vitals filed for this visit.   Subjective Assessment - 09/30/20 0935    Subjective I went to an exercise class yesterday and did a lot  of strength.  I'm a little stiff.    Currently in Pain? No/denies              [facility] PT Assessment - 09/30/20 0001      AROM   Right Shoulder Internal Rotation --   to L2                        [facility] Adult PT Treatment/Exercise - 09/30/20 0001      Lumbar Exercises: Stretches   Piriformis Stretch Right;2 reps;20 seconds      Lumbar Exercises: Aerobic   Nustep Level 2x 10 min- LEs only      Knee/Hip Exercises: Standing   Hip Abduction Both;AROM;2 sets;5 reps;Knee straight    Hip Extension Both;2 sets;5 sets;Knee straight      Knee/Hip Exercises: Seated   Ball Squeeze 10 reps x 5s hold    Sit to Sand 20 reps   seated on foam pad     Shoulder Exercises: Seated   Flexion Strengthening;Right;20 reps;Weights    Flexion Weight (lbs) 1    Flexion Limitations 2x10    Abduction Strengthening;Right;20 reps    ABduction Limitations 2x10    Diagonals Strengthening;Right;20 reps;Weights  Diagonals Limitations 2x10      Shoulder Exercises: ROM/Strengthening   [place] on wall [id number]                    PT Short Term Goals - 09/23/20 1422      PT SHORT TERM GOAL #1   Title Patient will be independent with HEP for continued progression at home.    Time 6    Period Weeks    Status Achieved    Target Date 09/28/20      PT SHORT TERM GOAL #2   Title Patient will demonstrate 4/5 Rt glute med and glute max strength for improved functional transfers    Time 6    Period Weeks    Status Partially Met   glue med 4/5; glute max 3+/5   Target Date 09/28/20      PT SHORT TERM GOAL #3   Title Patient will demonstrate 125 degrees abduction and IR to L4 of Rt UE for improved reaching and lifting ADLs    Time 6    Period Weeks    Status On-going   90 abduction; IR to sacrum   Target Date 09/28/20             PT Long Term Goals - 09/30/20 0955      PT LONG TERM GOAL #1   Title Patient will be independent with advanced HEP for long term management of  symptoms post D/C.    Baseline --    Status On-going      PT LONG TERM GOAL #2   Title Patient will demonstrate 150 degrees of Rt shoulder abduction and IR to L1 to more readily complete reaching tasks.    Baseline IR to L2    Time 12    Period Weeks    Status On-going                 Plan - 09/30/20 1000    Clinical Impression Statement Pt went to the gym this week and did an exercise class and walked. Pt with mild Rt shoulder pain with shoulder abduction and scaption without additional weight. PT provided tactile cues for scapular position with scaption and abduction.  Pain reduced with more reps of this today.  Pt required tactile and verbal cues for alignment and speed of movement.  Rt shoulder IR is improved to L2.  Session alternated between upper and lower body exercise to allow for rest in these muscles between sets.  Pt will continue to benefit from continued skilled intervention for improved functional mobility and improved functional use of Rt UE.    Rehab Potential Good    PT Frequency 2x / week    PT Duration 12 weeks    PT Treatment/Interventions Therapeutic exercise;Patient/family education;ADLs/Self Care Home Management;Manual techniques;Manual lymph drainage;Compression bandaging;Scar mobilization;Passive range of motion;Taping;Therapeutic activities;Functional mobility training;Neuromuscular re-education;Gait training;Balance training;Iontophoresis 4mg /ml Dexamethasone;Dry needling;Spinal Manipulations;Joint Manipulations;Other (comment)    PT Next Visit Plan scapular stabilization and bil LE strengthening    PT Home Exercise Plan Access Code: [email or web address]    Recommended Other Services initial cert is signed    Consulted and Agree with Plan of Care Patient           Patient will benefit from skilled therapeutic intervention in order to improve the following deficits and impairments:  Abnormal gait,Decreased activity tolerance,Decreased balance,Decreased  endurance,Decreased mobility,Decreased range of motion,Decreased strength,Difficulty walking,Hypomobility,Increased muscle spasms,Improper body mechanics,Pain,Impaired UE functional use  Visit Diagnosis: Pain in right hip  Cramp and spasm  Pain in left hip  Stiffness of right shoulder, not elsewhere classified  Muscle weakness (generalized)     Problem List Patient Active Problem List   Diagnosis Date Noted  . 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 Hepatic steatosis 09/10/2020  . Genetic testing 04/26/2020  . Family history of pancreatic cancer 04/15/2020  . Family history of ovarian cancer 04/15/2020  . Family history of prostate cancer 04/15/2020  . Family history of colon cancer 04/15/2020  . Malignant neoplasm of upper-outer quadrant of right breast in female, estrogen receptor positive ([place]) 04/05/2020  . Degenerative disc disease, cervical 08/07/2019  . Lumbar radiculopathy 04/11/2017  . 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 OSA on CPAP 12/23/2015  . Urinary incontinence 03/31/2014  . Fibrocystic breast disease 06/06/2011  . Routine health maintenance 11/12/2010  . Obesity 09/11/2007  . Hyperlipidemia 06/10/2007     Sigurd Sos, PT 09/30/20 10:16 AM  [facility] [address] [place], [place], [address] Phone: [phone]   Fax:  [phone]  Name: Renee Erb MRN: [id number] Date of Birth: 16-Oct-1940

## 2020-10-01 ENCOUNTER — Other Ambulatory Visit: Payer: Medicare HMO

## 2020-10-05 ENCOUNTER — Other Ambulatory Visit: Payer: Self-pay

## 2020-10-05 ENCOUNTER — Ambulatory Visit: Payer: Medicare HMO | Admitting: Physical Therapy

## 2020-10-05 DIAGNOSIS — R252 Cramp and spasm: Secondary | ICD-10-CM | POA: Diagnosis not present

## 2020-10-05 DIAGNOSIS — M25552 Pain in left hip: Secondary | ICD-10-CM | POA: Diagnosis not present

## 2020-10-05 DIAGNOSIS — M25611 Stiffness of right shoulder, not elsewhere classified: Secondary | ICD-10-CM

## 2020-10-05 DIAGNOSIS — M5441 Lumbago with sciatica, right side: Secondary | ICD-10-CM | POA: Diagnosis not present

## 2020-10-05 DIAGNOSIS — M6281 Muscle weakness (generalized): Secondary | ICD-10-CM | POA: Diagnosis not present

## 2020-10-05 DIAGNOSIS — M25551 Pain in right hip: Secondary | ICD-10-CM | POA: Diagnosis not present
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
## 2020-10-07 ENCOUNTER — Ambulatory Visit: Payer: Medicare HMO | Admitting: Physical Therapy

## 2020-10-07 ENCOUNTER — Other Ambulatory Visit: Payer: Self-pay

## 2020-10-07 DIAGNOSIS — M25551 Pain in right hip: Secondary | ICD-10-CM

## 2020-10-07 DIAGNOSIS — R252 Cramp and spasm: Secondary | ICD-10-CM

## 2020-10-07 DIAGNOSIS — M6281 Muscle weakness (generalized): Secondary | ICD-10-CM

## 2020-10-07 DIAGNOSIS — M25552 Pain in left hip: Secondary | ICD-10-CM | POA: Diagnosis not present

## 2020-10-07 DIAGNOSIS — M5441 Lumbago with sciatica, right side: Secondary | ICD-10-CM | POA: Diagnosis not present

## 2020-10-07 DIAGNOSIS — M25611 Stiffness of right shoulder, not elsewhere classified: Secondary | ICD-10-CM

## 2020-10-07 NOTE — Patient Instructions (Signed)
Sidelying Shoulder External Rotation - 1 x daily - 7 x weekly - 2 sets - 12 reps
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
## 2020-10-19 ENCOUNTER — Ambulatory Visit: Payer: Medicare HMO | Attending: Family Medicine | Admitting: Physical Therapy

## 2020-10-19 ENCOUNTER — Other Ambulatory Visit: Payer: Self-pay

## 2020-10-19 DIAGNOSIS — M25611 Stiffness of right shoulder, not elsewhere classified: Secondary | ICD-10-CM | POA: Insufficient documentation

## 2020-10-19 DIAGNOSIS — M25552 Pain in left hip: Secondary | ICD-10-CM | POA: Insufficient documentation

## 2020-10-19 DIAGNOSIS — M25551 Pain in right hip: Secondary | ICD-10-CM | POA: Insufficient documentation

## 2020-10-19 DIAGNOSIS — M6281 Muscle weakness (generalized): Secondary | ICD-10-CM

## 2020-10-19 DIAGNOSIS — R252 Cramp and spasm: Secondary | ICD-10-CM | POA: Insufficient documentation
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
## 2020-10-20 ENCOUNTER — Ambulatory Visit
Admission: RE | Admit: 2020-10-20 | Discharge: 2020-10-20 | Disposition: A | Discharge status: Home / Self Care | Payer: Medicare HMO | Source: Ambulatory Visit | Attending: Internal Medicine | Admitting: Internal Medicine

## 2020-10-20 DIAGNOSIS — K760 Fatty (change of) liver, not elsewhere classified: Secondary | ICD-10-CM | POA: Diagnosis not present

## 2020-10-20 DIAGNOSIS — D3500 Benign neoplasm of unspecified adrenal gland: Secondary | ICD-10-CM

## 2020-10-20 DIAGNOSIS — N281 Cyst of kidney, acquired: Secondary | ICD-10-CM | POA: Diagnosis not present

## 2020-10-21 ENCOUNTER — Ambulatory Visit: Payer: Medicare HMO | Admitting: Physical Therapy

## 2020-10-21 ENCOUNTER — Other Ambulatory Visit: Payer: Self-pay

## 2020-10-21 DIAGNOSIS — R252 Cramp and spasm: Secondary | ICD-10-CM

## 2020-10-21 DIAGNOSIS — M6281 Muscle weakness (generalized): Secondary | ICD-10-CM

## 2020-10-21 DIAGNOSIS — M25551 Pain in right hip: Secondary | ICD-10-CM | POA: Diagnosis not present

## 2020-10-21 DIAGNOSIS — M25552 Pain in left hip: Secondary | ICD-10-CM

## 2020-10-21 DIAGNOSIS — M25611 Stiffness of right shoulder, not elsewhere classified: Secondary | ICD-10-CM
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
## 2020-10-26 ENCOUNTER — Other Ambulatory Visit: Payer: Self-pay

## 2020-10-26 ENCOUNTER — Ambulatory Visit: Payer: Medicare HMO

## 2020-10-26 DIAGNOSIS — M25552 Pain in left hip: Secondary | ICD-10-CM

## 2020-10-26 DIAGNOSIS — M25551 Pain in right hip: Secondary | ICD-10-CM | POA: Diagnosis not present

## 2020-10-26 DIAGNOSIS — M25611 Stiffness of right shoulder, not elsewhere classified: Secondary | ICD-10-CM | POA: Diagnosis not present

## 2020-10-26 DIAGNOSIS — M6281 Muscle weakness (generalized): Secondary | ICD-10-CM

## 2020-10-26 DIAGNOSIS — R252 Cramp and spasm: Secondary | ICD-10-CM | POA: Diagnosis not present

## 2020-10-26 NOTE — Therapy (Signed)
[facility] [address], [place], [place], [address] Phone: [phone]   Fax:  [phone]  Physical Therapy Treatment  Patient Details  Name: Kelly [name] MRN: [id number] Date of Birth: [birth year] Referring Provider (PT): [name], MD   Encounter Date: 10/26/2020   PT End of Session - 10/26/20 1016     Visit Number 17    Date for PT Re-Evaluation 11/09/20    Authorization Type [employer] Medicare    Authorization - Visit Number 15    Authorization - Number of Visits 24    Progress Note Due on Visit 20    PT Start Time 3149    PT Stop Time 1013    PT Time Calculation (min) 38 min    Activity Tolerance Patient tolerated treatment well;No increased pain    Behavior During Therapy WFL for tasks assessed/performed             Past Medical History:  Diagnosis Date   Allergy    Blood transfusion without reported diagnosis    Breast cancer ([place])    Breast mass    fibocystic breast dx   Cataract    Family history of colon cancer 04/15/2020   Family history of pancreatic cancer 04/15/2020   Family history of prostate cancer 04/15/2020   Hepatitis    CMV 1992   Hyperlipidemia    Postmenopausal HRT (hormone replacement therapy)    Scarlet fever as a teen   Sleep apnea     Past Surgical History:  Procedure Laterality Date   APPENDECTOMY     BREAST LUMPECTOMY WITH RADIOACTIVE SEED LOCALIZATION Right 04/27/2020   Procedure: RIGHT BREAST LUMPECTOMY WITH RADIOACTIVE SEED LOCALIZATION;  Surgeon: [name], MD;  Location: [place];  Service: General;  Laterality: Right;   BUNIONECTOMY     caesarean section     COLONOSCOPY     CORRECTION HAMMER TOE     FOOT TENDON SURGERY     left    POLYPECTOMY     TONSILLECTOMY     TONSILLECTOMY      There were no vitals filed for this visit.   Subjective Assessment - 10/26/20 0942     Subjective I am just having a terrible time. I am sore all over when I move.     Currently in Pain? Yes    Pain Score 3     Pain Location Knee    Pain Orientation Right;Proximal    Pain Descriptors / Indicators Aching    Pain Type Chronic pain;Acute pain    Pain Onset 1 to 4 weeks ago    Pain Frequency Constant    Aggravating Factors  change of position    Pain Relieving Factors rest                               [facility] Adult PT Treatment/Exercise - 10/26/20 0001       Lumbar Exercises: Stretches   Active Hamstring Stretch Right;Left;2 reps;20 seconds    Single Knee to Chest Stretch 3 reps;20 seconds    Figure 4 Stretch 2 reps;20 seconds      Lumbar Exercises: Aerobic   Nustep L2 x 10 minutes: PT present to discuss progress and monitor patient response      Lumbar Exercises: Supine   Ab Set 20 reps;5 seconds    AB Set Limitations with ball squeeze    Clam 20 reps  Clam Limitations green loop at knees      Shoulder Exercises: Supine   Other Supine Exercises shoulder circles holding blue weighted ball 2x10 bil each                      PT Short Term Goals - 10/19/20 1010       PT SHORT TERM GOAL #1   Title Patient will be independent with HEP for continued progression at home.    Time 6    Period Weeks    Status Achieved    Target Date 09/28/20      PT SHORT TERM GOAL #3   Title Patient will demonstrate 125 degrees abduction and IR to L4 of Rt UE for improved reaching and lifting ADLs    Time 6    Period Weeks    Status Achieved   150 degrees abduction; IR to T12   Target Date 09/28/20               PT Long Term Goals - 10/21/20 1004       PT LONG TERM GOAL #1   Title Patient will be independent with advanced HEP for long term management of symptoms post D/C.    Time 12    Period Weeks    Status On-going      PT LONG TERM GOAL #2   Title Patient will demonstrate 150 degrees of Rt shoulder abduction and IR to L1 to more readily complete reaching tasks.    Baseline IR to L2    Time 12     Period Weeks    Status Achieved      PT LONG TERM GOAL #3   Title Patient will score 50 on FOTO to indicate improved overall mobility.    Baseline 33    Time 12    Period Weeks    Status Achieved      PT LONG TERM GOAL #4   Title Patient will lift 10# from floor level using proper mechanics x5 reps without increased pain to more readily complete ADLs    Time 12    Period Weeks    Status On-going      PT LONG TERM GOAL #5   Title Patient will raise/lower 2# to overhead shelf using Rt UE without increased pain to more readily complete homemaking tasks.    Time 12    Period Weeks    Status On-going   able to raise/lower 1# from waist to eye level                  Plan - 10/26/20 0948     Clinical Impression Statement Pt arrived reporting a flare-up of pain in the hip and legs.  Session focused on gentle exercise progression in the clinic today.  Therapist provided tactile cue for increased core and glute engagement with patient reporting decreased strain. Pt was able to tolerate NuStep and gentle strength/flexibility without increased pain today. Pt will benefit from continued skilled intervention to address impairments for improved functional mobility and to more readily perform ADLs using bil UE.    PT Frequency 2x / week    PT Duration 12 weeks    PT Treatment/Interventions Therapeutic exercise;Patient/family education;ADLs/Self Care Home Management;Manual techniques;Manual lymph drainage;Compression bandaging;Scar mobilization;Passive range of motion;Taping;Therapeutic activities;Functional mobility training;Neuromuscular re-education;Gait training;Balance training;Iontophoresis 4mg /ml Dexamethasone;Dry needling;Spinal Manipulations;Joint Manipulations;Other (comment)    PT Next Visit Plan advance activity as pain/symptoms arise    PT Home Exercise Plan  Access Code: [email or web address]    Consulted and Agree with Plan of Care Patient             Patient will benefit from  skilled therapeutic intervention in order to improve the following deficits and impairments:  Abnormal gait, Decreased activity tolerance, Decreased balance, Decreased endurance, Decreased mobility, Decreased range of motion, Decreased strength, Difficulty walking, Hypomobility, Increased muscle spasms, Improper body mechanics, Pain, Impaired UE functional use  Visit Diagnosis: Pain in right hip  Cramp and spasm  Pain in left hip  Stiffness of right shoulder, not elsewhere classified  Muscle weakness (generalized)     Problem List Patient Active Problem List   Diagnosis Date Noted   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   Hepatic steatosis 09/10/2020   Genetic testing 04/26/2020   Family history of pancreatic cancer 04/15/2020   Family history of ovarian cancer 04/15/2020   Family history of prostate cancer 04/15/2020   Family history of colon cancer 04/15/2020   Malignant neoplasm of upper-outer quadrant of right breast in female, estrogen receptor positive ([place]) 04/05/2020   Degenerative disc disease, cervical 08/07/2019   Lumbar radiculopathy 04/11/2017   Bunion of great toe of right foot 04/18/2016   OSA on CPAP 12/23/2015   Urinary incontinence 03/31/2014   Fibrocystic breast disease 06/06/2011   Routine health maintenance 11/12/2010   Obesity 09/11/2007   Hyperlipidemia 06/10/2007    Sigurd Sos, PT 10/26/20 10:21 AM   [facility] [address], [place], [place], [address] Phone: [phone]   Fax:  [phone]  Name: Kelly [name] MRN: [id number] Date of Birth: 08/21/1940

## 2020-10-28 ENCOUNTER — Other Ambulatory Visit: Payer: Self-pay

## 2020-10-28 ENCOUNTER — Ambulatory Visit: Payer: Medicare HMO

## 2020-10-28 DIAGNOSIS — M25552 Pain in left hip: Secondary | ICD-10-CM

## 2020-10-28 DIAGNOSIS — M25551 Pain in right hip: Secondary | ICD-10-CM | POA: Diagnosis not present

## 2020-10-28 DIAGNOSIS — M6281 Muscle weakness (generalized): Secondary | ICD-10-CM

## 2020-10-28 DIAGNOSIS — R252 Cramp and spasm: Secondary | ICD-10-CM | POA: Diagnosis not present

## 2020-10-28 DIAGNOSIS — M25611 Stiffness of right shoulder, not elsewhere classified: Secondary | ICD-10-CM

## 2020-10-28 NOTE — Patient Instructions (Signed)
Access Code: [email or web address] URL: [email or web address] Date: 10/28/2020 Prepared by: Claiborne Billings  Program Notes Perform for both legs    Standing Shoulder Internal Rotation Stretch with Towel - 1 x daily - 7 x weekly - 1 sets - 10 reps - 10 hold

## 2020-10-28 NOTE — Therapy (Signed)
[facility] [address], [place], [place], [address] Phone: [phone]   Fax:  [phone]  Physical Therapy Treatment  Patient Details  Name: Kelly [name] MRN: [id number] Date of Birth: 19-Dec-1940 Referring Provider (PT): [name], MD   Encounter Date: 10/28/2020   PT End of Session - 10/28/20 1015     Visit Number 18    Date for PT Re-Evaluation 11/09/20    Authorization Type [employer]    Authorization Time Period KX at visit 15    PT Start Time 0933    PT Stop Time 1012    PT Time Calculation (min) 39 min    Activity Tolerance Patient tolerated treatment well;No increased pain    Behavior During Therapy WFL for tasks assessed/performed             Past Medical History:  Diagnosis Date   Allergy    Blood transfusion without reported diagnosis    Breast cancer ([place])    Breast mass    fibocystic breast dx   Cataract    Family history of colon cancer 04/15/2020   Family history of pancreatic cancer 04/15/2020   Family history of prostate cancer 04/15/2020   Hepatitis    CMV 1992   Hyperlipidemia    Postmenopausal HRT (hormone replacement therapy)    Scarlet fever as a teen   Sleep apnea     Past Surgical History:  Procedure Laterality Date   APPENDECTOMY     BREAST LUMPECTOMY WITH RADIOACTIVE SEED LOCALIZATION Right 04/27/2020   Procedure: RIGHT BREAST LUMPECTOMY WITH RADIOACTIVE SEED LOCALIZATION;  Surgeon: [name], MD;  Location: [facility];  Service: General;  Laterality: Right;   BUNIONECTOMY     caesarean section     COLONOSCOPY     CORRECTION HAMMER TOE     FOOT TENDON SURGERY     left    POLYPECTOMY     TONSILLECTOMY     TONSILLECTOMY      There were no vitals filed for this visit.   Subjective Assessment - 10/28/20 0935     Subjective I got a massage and it really helped.  I am going to go weekly now to help maintain.  85-90% improvement since the start.     Currently in Pain? No/denies                [facility] PT Assessment - 10/28/20 0001       Assessment   Medical Diagnosis M79.604 (ICD-10-CM) - Right leg pain  R29.898 (ICD-10-CM) - Leg weakness, bilateral    Referring Provider (PT) [name], MD    Onset Date/Surgical Date 06/28/20    Hand Dominance Right      AROM   Right Shoulder ABduction 155 Degrees    Right Shoulder Internal Rotation --   to T12     Strength   Right Hip ABduction 4/5                           [facility] Adult PT Treatment/Exercise - 10/28/20 0001       Lumbar Exercises: Stretches   Active Hamstring Stretch Right;Left;2 reps;20 seconds    Single Knee to Chest Stretch 3 reps;20 seconds    Figure 4 Stretch 2 reps;20 seconds      Lumbar Exercises: Aerobic   Nustep L2 x 10 minutes: PT present to discuss progress and monitor patient response      Shoulder Exercises:  ROM/Strengthening   [place] IR with towel 10 sec x 5                    PT Education - 10/28/20 1006     Education Details Access Code: [email or web address], review all of HEP with use of visuals    Person(s) Educated Patient    Methods Demonstration;Explanation;Handout    Comprehension Verbalized understanding;Returned demonstration              PT Short Term Goals - 10/19/20 1010       PT SHORT TERM GOAL #1   Title Patient will be independent with HEP for continued progression at home.    Time 6    Period Weeks    Status Achieved    Target Date 09/28/20      PT SHORT TERM GOAL #3   Title Patient will demonstrate 125 degrees abduction and IR to L4 of Rt UE for improved reaching and lifting ADLs    Time 6    Period Weeks    Status Achieved   150 degrees abduction; IR to T12   Target Date 09/28/20               PT Long Term Goals - 10/28/20 0937       PT LONG TERM GOAL #1   Title Patient will be independent with advanced HEP for long term management of symptoms post D/C.    Status Achieved      PT  LONG TERM GOAL #2   Title Patient will demonstrate 150 degrees of Rt shoulder abduction and IR to L1 to more readily complete reaching tasks.    Baseline IR to T12 after stretching, full hip abduction    Status Achieved      PT LONG TERM GOAL #3   Title Patient will score 50 on FOTO to indicate improved overall mobility.    Baseline 53    Status Achieved      PT LONG TERM GOAL #4   Title Patient will lift 10# from floor level using proper mechanics x5 reps without increased pain to more readily complete ADLs    Baseline --    Status Achieved      PT LONG TERM GOAL #5   Title Patient will raise/lower 2# to overhead shelf using Rt UE without increased pain to more readily complete homemaking tasks.    Baseline limited range with this    Status Partially Met      PT LONG TERM GOAL #6   Title Pt will have no pain when she turns her head to look over her right shoulder.    Baseline --    Status Achieved      PT LONG TERM GOAL #7   Title Pt will be able to stand up from a chair without pain to allow improved comfort.    Status Achieved      PT LONG TERM GOAL #8   Title Pt will demonstrate 3+/5 bilateral hip abduction strength to decrease fall risk and improve form during sit to stand.    Baseline 4/5    Status Achieved                   Plan - 10/28/20 1028     Clinical Impression Statement Pt reports 85-90% overall improvement since the start of care. FOTO is improved to 52-goal met.  Pt has intermittent episodes of Rt shoulder and leg pain that is related to activity.  Pt has HEP in place, is getting regular massages and is going to the gym and working with a trainer regularly.  Pt is limited with reaching overhead with Rt UE when holding weight and reaching overhead.  PT reviewed all HEP with pt and pt verbalized understanding of how to progress after discharge.    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   Hepatic steatosis 09/10/2020   Genetic testing 04/26/2020   Family history of pancreatic cancer 04/15/2020   Family history of ovarian cancer 04/15/2020   Family history of prostate cancer 04/15/2020   Family history of colon cancer 04/15/2020   Malignant neoplasm of upper-outer quadrant of right breast in female, estrogen receptor positive ([place]) 04/05/2020   Degenerative disc disease, cervical 08/07/2019   Lumbar radiculopathy 04/11/2017   Bunion of great toe of right foot 04/18/2016   OSA on CPAP 12/23/2015   Urinary incontinence 03/31/2014   Fibrocystic breast disease 06/06/2011   Routine health maintenance 11/12/2010   Obesity 09/11/2007   Hyperlipidemia 06/10/2007   PHYSICAL THERAPY DISCHARGE SUMMARY  Visits from Start of Care: 18  Current functional level related to goals / functional outcomes: Pt will D/C to HEP.  See above for current status.     Remaining deficits: Rt shoulder pain with reaching overhead and Rt LE pain with walking.  Pt has HEP in place to address remaining deficits.    Education / Equipment: HEP, Economist    Patient agrees to discharge. Patient goals were met. Patient is being discharged due to meeting the stated rehab goals.  Sigurd Sos, PT 10/28/20 10:32 AM   [facility] [address] [place], [place], [address] Phone: [phone]   Fax:  [phone]  Name: Kelly [name] MRN: [id number] Date of Birth: 1940-07-27

## 2020-11-08 DIAGNOSIS — G4733 Obstructive sleep apnea (adult) (pediatric): Secondary | ICD-10-CM | POA: Diagnosis not present

## 2020-11-24 ENCOUNTER — Ambulatory Visit (INDEPENDENT_AMBULATORY_CARE_PROVIDER_SITE_OTHER): Payer: Medicare HMO

## 2020-11-24 ENCOUNTER — Ambulatory Visit: Payer: Medicare HMO | Admitting: Family Medicine

## 2020-11-24 ENCOUNTER — Other Ambulatory Visit: Payer: Self-pay

## 2020-11-24 ENCOUNTER — Ambulatory Visit: Payer: Self-pay

## 2020-11-24 VITALS — BP 124/82 | HR 95 | Ht 67.0 in | Wt 211.2 lb

## 2020-11-24 DIAGNOSIS — M25511 Pain in right shoulder: Secondary | ICD-10-CM

## 2020-11-24 DIAGNOSIS — W19XXXA Unspecified fall, initial encounter: Secondary | ICD-10-CM

## 2020-11-24 DIAGNOSIS — R0781 Pleurodynia: Secondary | ICD-10-CM

## 2020-11-24 DIAGNOSIS — M7061 Trochanteric bursitis, right hip: Secondary | ICD-10-CM | POA: Diagnosis not present

## 2020-11-24 DIAGNOSIS — R079 Chest pain, unspecified: Secondary | ICD-10-CM | POA: Diagnosis not present

## 2020-11-24 NOTE — Progress Notes (Signed)
I, Peterson Lombard, LAT, ATC acting as a scribe for [name], MD.  Kelly [name] is a 80 y.o. female who presents to [place] at [facility] today for R arm pain that she injured from a fall  2-3 weeks ago. Pt was coming out of the bathroom and caught her foot on the leg of the chair and landing on R side. Pt was last seen by Dr. Georgina Snell on 09/07/20 for R shoulder pain, lumbar radiculopathy, and the incidental finding of an adrenal adenoma seen on MRI. Today, pt locates pain to R scapular area, R mid-to upper humerus, bilat low back and lateral aspect of R knee.  Aggravates: shoulder ABD, up stairs, transitioning from sitting to standing. Treatments tried: massage therapy, ice, heat  Dx imaging: 08/26/20 Renal US             08/08/20 R shoulder & L-spine MRI             07/30/20 L-spine & c-spine XR             07/06/20 R shoulder XR, pelvis XR, & L-spine XR  Pertinent review of systems: No fevers or chills  Relevant historical information: Prior history of right shoulder rotator cuff tendinitis and right hip trochanteric bursitis   Exam:  BP 124/82   Pulse 95   Ht 5\' 7"  (1.702 m)   Wt 211 lb 3.2 oz (95.8 kg)   SpO2 95%   BMI 33.08 kg/m  General: Well Developed, well nourished, and in no acute distress.   MSK: Right shoulder normal-appearing Tender palpation anterior and superior shoulder. Range of motion reduced abduction and internal rotation. Intact strength within limits of range of motion. Positive Hawkins and Neer's test. Right hip tender palpation greater trochanter.  Normal motion.    Lab and Radiology Results  Diagnostic Limited MSK Ultrasound of: Right shoulder Biceps tendon intact normal-appearing Subscapularis tendon is intact.  Slight supraspinatus tendon is intact without large full-thickness retracted rotator cuff tear. Subacromial bursitis is present. Infraspinatus tendon is intact. Impression: No large full-thickness retracted rotator  cuff tear visible.   X-ray images right shoulder and right ribs obtained today personally and independently interpreted  Right shoulder: No acute fractures.  No severe degenerative changes glenohumeral joint.  AC DJD present.  Right ribs: Possible second rib fracture.  No pneumothorax.  Await formal radiology review   Assessment and Plan: 80 y.o. female with right shoulder pain after fall.  Likely exacerbation of existing rotator cuff tendinopathy versus partial new rotator cuff tear.  Doubtful for full-thickness complete rotator cuff tear.  Plan to refer back to PT and recheck in 6 weeks.  Possible she did fracture her second rib although radiology overread is still pending.  Additionally she does have greater trochanteric bursitis or injury to this area.  Physical therapy would help this as well.  Recheck 6 weeks.   PDMP not reviewed this encounter. Orders Placed This Encounter  Procedures   [place] LIMITED JOINT SPACE STRUCTURES UP RIGHT(NO LINKED CHARGES)    Standing Status:   Future    Number of Occurrences:   1    Standing Expiration Date:   05/27/2021    Order Specific Question:   Reason for Exam (SYMPTOM  OR DIAGNOSIS REQUIRED)    Answer:   right shoulder pain    Order Specific Question:   Preferred imaging location?    Answer:   [facility]   DG Shoulder Right  Standing Status:   Future    Number of Occurrences:   1    Standing Expiration Date:   11/24/2021    Order Specific Question:   Reason for Exam (SYMPTOM  OR DIAGNOSIS REQUIRED)    Answer:   eval shoulder pain after fall    Order Specific Question:   Preferred imaging location?    Answer:   [facility]   DG Ribs Unilateral W/Chest Right    Standing Status:   Future    Number of Occurrences:   1    Standing Expiration Date:   11/24/2021    Order Specific Question:   Reason for Exam (SYMPTOM  OR DIAGNOSIS REQUIRED)    Answer:   eval chest pain after fall    Order Specific  Question:   Preferred imaging location?    Answer:   [facility]   Ambulatory referral to Physical Therapy    Referral Priority:   Routine    Referral Type:   Physical Medicine    Referral Reason:   Specialty Services Required    Requested Specialty:   Physical Therapy    Number of Visits Requested:   1   No orders of the defined types were placed in this encounter.    Discussed warning signs or symptoms. Please see discharge instructions. Patient expresses understanding.   The above documentation has been reviewed and is accurate and complete [name], M.D.

## 2020-11-24 NOTE — Patient Instructions (Addendum)
Thank you for coming in today.   Please get an Xray today before you leave   I've referred you to Physical Therapy.  Let us know if you don't hear from them in one week.   Recheck in 6 weeks.   Let me know if this is not working.    

## 2020-11-26 ENCOUNTER — Encounter: Payer: Self-pay | Admitting: Family Medicine

## 2020-11-26 NOTE — Progress Notes (Signed)
X-ray right ribs look normal to radiology

## 2020-11-29 NOTE — Telephone Encounter (Signed)
Pt called back to discuss. The best number to reach her at is [phone]

## 2020-11-29 NOTE — Progress Notes (Signed)
Right shoulder x-ray shows some mild arthritis changes.

## 2020-11-29 NOTE — Telephone Encounter (Signed)
Spoke to pt to discuss her XR results and clarify Dr. Clovis Riley plan. Pt was advised to return the call from PT and get scheduled to begin her treatments. Pt verbalized understanding.

## 2020-11-30 ENCOUNTER — Ambulatory Visit: Payer: Medicare HMO | Admitting: Physical Therapy

## 2020-11-30 ENCOUNTER — Encounter: Payer: Self-pay | Admitting: Physical Therapy

## 2020-11-30 ENCOUNTER — Ambulatory Visit: Payer: Medicare HMO | Attending: Family Medicine | Admitting: Physical Therapy

## 2020-11-30 ENCOUNTER — Other Ambulatory Visit: Payer: Self-pay

## 2020-11-30 DIAGNOSIS — M25511 Pain in right shoulder: Secondary | ICD-10-CM

## 2020-11-30 DIAGNOSIS — M6281 Muscle weakness (generalized): Secondary | ICD-10-CM | POA: Insufficient documentation

## 2020-11-30 DIAGNOSIS — M25551 Pain in right hip: Secondary | ICD-10-CM | POA: Diagnosis present

## 2020-11-30 DIAGNOSIS — M25611 Stiffness of right shoulder, not elsewhere classified: Secondary | ICD-10-CM | POA: Diagnosis present
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
## 2020-11-30 NOTE — Patient Instructions (Signed)
Access Code: [email or web address] URL: [email or web address] Date: 11/30/2020 Prepared by: Ruben Im  Program Notes Perform for both legs   Exercises Standing Hip Abduction with Counter Support - 1 x daily - 7 x weekly - 2 sets - 10 reps Sit to Stand with Arm Reach Toward Target - 1 x daily - 7 x weekly - 2 sets - 5 reps Clamshell with Resistance - 1 x daily - 7 x weekly - 2 sets - 10 reps Standing Row with Anchored Resistance - 1 x daily - 7 x weekly - 2 sets - 10 reps Seated Piriformis Stretch with Trunk Bend - 3 x daily - 7 x weekly - 1 sets - 3 reps - 20 hold Hip Extension with Resistance Loop - 1 x daily - 7 x weekly - 2 sets - 5 reps Seated Hip Adduction Isometrics with Ball - 1 x daily - 7 x weekly - 2 sets - 10 reps - 5 hold Sidelying Shoulder External Rotation - 1 x daily - 7 x weekly - 2 sets - 12 reps Supine Hip External Rotation Stretch - 2 x daily - 7 x weekly - 1 sets - 2 reps Seated Hip External Rotation Stretch - 2 x daily - 7 x weekly - 1 sets - 2 reps Standing Shoulder Internal Rotation Stretch with Towel - 1 x daily - 7 x weekly - 1 sets - 10 reps - 10 hold Side-lying Shoulder PNF D2 Flexion and Extension - 2-3 x daily - 7 x weekly - 1 sets - 10 reps Supine Shoulder Flexion Extension AAROM with Dowel - 2-3 x daily - 7 x weekly - 1 sets - 10 reps Seated Shoulder Flexion Slide at Table Top with Forearm in Neutral - 2-3 x daily - 7 x weekly - 1 sets - 10 reps

## 2020-12-02 ENCOUNTER — Other Ambulatory Visit: Payer: Self-pay

## 2020-12-02 ENCOUNTER — Ambulatory Visit: Payer: Medicare HMO | Attending: Family Medicine | Admitting: Physical Therapy

## 2020-12-02 DIAGNOSIS — M6281 Muscle weakness (generalized): Secondary | ICD-10-CM

## 2020-12-02 DIAGNOSIS — R252 Cramp and spasm: Secondary | ICD-10-CM | POA: Insufficient documentation

## 2020-12-02 DIAGNOSIS — M25511 Pain in right shoulder: Secondary | ICD-10-CM | POA: Insufficient documentation

## 2020-12-02 DIAGNOSIS — M25611 Stiffness of right shoulder, not elsewhere classified: Secondary | ICD-10-CM | POA: Diagnosis not present
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
## 2020-12-02 NOTE — Patient Instructions (Signed)
Access Code: [email or web address] URL: [email or web address] Date: 12/02/2020 Prepared by: Ruben Im  Program Notes Perform for both legs   Exercises Standing Hip Abduction with Counter Support - 1 x daily - 7 x weekly - 2 sets - 10 reps Sit to Stand with Arm Reach Toward Target - 1 x daily - 7 x weekly - 2 sets - 5 reps Clamshell with Resistance - 1 x daily - 7 x weekly - 2 sets - 10 reps Standing Row with Anchored Resistance - 1 x daily - 7 x weekly - 2 sets - 10 reps Seated Piriformis Stretch with Trunk Bend - 3 x daily - 7 x weekly - 1 sets - 3 reps - 20 hold Hip Extension with Resistance Loop - 1 x daily - 7 x weekly - 2 sets - 5 reps Seated Hip Adduction Isometrics with Ball - 1 x daily - 7 x weekly - 2 sets - 10 reps - 5 hold Sidelying Shoulder External Rotation - 1 x daily - 7 x weekly - 2 sets - 12 reps Supine Hip External Rotation Stretch - 2 x daily - 7 x weekly - 1 sets - 2 reps Seated Hip External Rotation Stretch - 2 x daily - 7 x weekly - 1 sets - 2 reps Standing Shoulder Internal Rotation Stretch with Towel - 1 x daily - 7 x weekly - 1 sets - 10 reps - 10 hold Side-lying Shoulder PNF D2 Flexion and Extension - 2-3 x daily - 7 x weekly - 1 sets - 10 reps Supine Shoulder Flexion Extension AAROM with Dowel - 2-3 x daily - 7 x weekly - 1 sets - 10 reps Seated Shoulder Flexion Slide at Table Top with Forearm in Neutral - 2-3 x daily - 7 x weekly - 1 sets - 10 reps Seated Thoracic Lumbar Extension - 1 x daily - 7 x weekly - 1 sets - 10 reps Isometric Shoulder Extension at Wall (Mirrored) - 1 x daily - 7 x weekly - 1 sets - 5 reps - 5 hold Isometric Shoulder Abduction at Wall - 1 x daily - 7 x weekly - 1 sets - 5 reps - 5 hold Isometric Shoulder External Rotation at Wall - 1 x daily - 7 x weekly - 1 sets - 5 reps - 5 hold

## 2020-12-07 ENCOUNTER — Ambulatory Visit: Payer: Medicare HMO | Admitting: Physical Therapy

## 2020-12-07 ENCOUNTER — Other Ambulatory Visit: Payer: Self-pay

## 2020-12-07 DIAGNOSIS — M6281 Muscle weakness (generalized): Secondary | ICD-10-CM

## 2020-12-07 DIAGNOSIS — M25511 Pain in right shoulder: Secondary | ICD-10-CM | POA: Diagnosis not present

## 2020-12-07 DIAGNOSIS — M25611 Stiffness of right shoulder, not elsewhere classified: Secondary | ICD-10-CM | POA: Diagnosis not present

## 2020-12-07 DIAGNOSIS — R252 Cramp and spasm: Secondary | ICD-10-CM | POA: Diagnosis not present
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
## 2020-12-07 NOTE — Patient Instructions (Signed)
Access Code: [email or web address] URL: [email or web address] Date: 12/07/2020 Prepared by: Ruben Im  Program Notes Perform for both legs   Exercises Standing Hip Abduction with Counter Support - 1 x daily - 7 x weekly - 2 sets - 10 reps Sit to Stand with Arm Reach Toward Target - 1 x daily - 7 x weekly - 2 sets - 5 reps Clamshell with Resistance - 1 x daily - 7 x weekly - 2 sets - 10 reps Standing Row with Anchored Resistance - 1 x daily - 7 x weekly - 2 sets - 10 reps Seated Piriformis Stretch with Trunk Bend - 3 x daily - 7 x weekly - 1 sets - 3 reps - 20 hold Hip Extension with Resistance Loop - 1 x daily - 7 x weekly - 2 sets - 5 reps Seated Hip Adduction Isometrics with Ball - 1 x daily - 7 x weekly - 2 sets - 10 reps - 5 hold Sidelying Shoulder External Rotation - 1 x daily - 7 x weekly - 2 sets - 12 reps Supine Hip External Rotation Stretch - 2 x daily - 7 x weekly - 1 sets - 2 reps Seated Hip External Rotation Stretch - 2 x daily - 7 x weekly - 1 sets - 2 reps Standing Shoulder Internal Rotation Stretch with Towel - 1 x daily - 7 x weekly - 1 sets - 10 reps - 10 hold Side-lying Shoulder PNF D2 Flexion and Extension - 2-3 x daily - 7 x weekly - 1 sets - 10 reps Supine Shoulder Flexion Extension AAROM with Dowel - 2-3 x daily - 7 x weekly - 1 sets - 10 reps Seated Shoulder Flexion Slide at Table Top with Forearm in Neutral - 2-3 x daily - 7 x weekly - 1 sets - 10 reps Seated Thoracic Lumbar Extension - 1 x daily - 7 x weekly - 1 sets - 10 reps Isometric Shoulder Extension at Wall (Mirrored) - 1 x daily - 7 x weekly - 1 sets - 5 reps - 5 hold Isometric Shoulder Abduction at Wall - 1 x daily - 7 x weekly - 1 sets - 5 reps - 5 hold Isometric Shoulder External Rotation at Wall - 1 x daily - 7 x weekly - 1 sets - 5 reps - 5 hold Standing Single Arm Row with Resistance Thumb Up - 1 x daily - 7 x weekly - 1 sets - 10 reps Single Arm Shoulder Extension with Anchored Resistance - 1 x  daily - 7 x weekly - 1 sets - 10 reps

## 2020-12-09 ENCOUNTER — Other Ambulatory Visit: Payer: Self-pay

## 2020-12-09 ENCOUNTER — Ambulatory Visit: Payer: Medicare HMO | Admitting: Physical Therapy

## 2020-12-09 DIAGNOSIS — M25511 Pain in right shoulder: Secondary | ICD-10-CM | POA: Diagnosis not present

## 2020-12-09 DIAGNOSIS — M25611 Stiffness of right shoulder, not elsewhere classified: Secondary | ICD-10-CM

## 2020-12-09 DIAGNOSIS — M6281 Muscle weakness (generalized): Secondary | ICD-10-CM | POA: Diagnosis not present

## 2020-12-09 DIAGNOSIS — R252 Cramp and spasm: Secondary | ICD-10-CM
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
## 2020-12-14 ENCOUNTER — Ambulatory Visit: Payer: Medicare HMO | Attending: Family Medicine | Admitting: Physical Therapy

## 2020-12-14 ENCOUNTER — Other Ambulatory Visit: Payer: Self-pay

## 2020-12-14 DIAGNOSIS — R252 Cramp and spasm: Secondary | ICD-10-CM | POA: Insufficient documentation

## 2020-12-14 DIAGNOSIS — M25611 Stiffness of right shoulder, not elsewhere classified: Secondary | ICD-10-CM | POA: Insufficient documentation

## 2020-12-14 DIAGNOSIS — M25511 Pain in right shoulder: Secondary | ICD-10-CM

## 2020-12-14 DIAGNOSIS — M25551 Pain in right hip: Secondary | ICD-10-CM | POA: Diagnosis not present

## 2020-12-14 DIAGNOSIS — M6281 Muscle weakness (generalized): Secondary | ICD-10-CM | POA: Insufficient documentation
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
## 2020-12-16 ENCOUNTER — Other Ambulatory Visit: Payer: Self-pay

## 2020-12-16 ENCOUNTER — Ambulatory Visit: Payer: Medicare HMO | Admitting: Physical Therapy

## 2020-12-16 DIAGNOSIS — M25511 Pain in right shoulder: Secondary | ICD-10-CM | POA: Diagnosis not present

## 2020-12-16 DIAGNOSIS — M6281 Muscle weakness (generalized): Secondary | ICD-10-CM

## 2020-12-16 DIAGNOSIS — M25611 Stiffness of right shoulder, not elsewhere classified: Secondary | ICD-10-CM | POA: Diagnosis not present

## 2020-12-16 DIAGNOSIS — M25551 Pain in right hip: Secondary | ICD-10-CM | POA: Diagnosis not present

## 2020-12-16 DIAGNOSIS — R252 Cramp and spasm: Secondary | ICD-10-CM | POA: Diagnosis not present
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
## 2020-12-17 DIAGNOSIS — E119 Type 2 diabetes mellitus without complications: Secondary | ICD-10-CM | POA: Diagnosis not present

## 2020-12-21 ENCOUNTER — Other Ambulatory Visit: Payer: Self-pay

## 2020-12-21 ENCOUNTER — Ambulatory Visit: Payer: Medicare HMO | Admitting: Physical Therapy

## 2020-12-21 DIAGNOSIS — M6281 Muscle weakness (generalized): Secondary | ICD-10-CM

## 2020-12-21 DIAGNOSIS — M25551 Pain in right hip: Secondary | ICD-10-CM | POA: Diagnosis not present

## 2020-12-21 DIAGNOSIS — M25611 Stiffness of right shoulder, not elsewhere classified: Secondary | ICD-10-CM | POA: Diagnosis not present

## 2020-12-21 DIAGNOSIS — M25511 Pain in right shoulder: Secondary | ICD-10-CM

## 2020-12-21 DIAGNOSIS — R252 Cramp and spasm: Secondary | ICD-10-CM | POA: Diagnosis not present
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
## 2020-12-23 ENCOUNTER — Ambulatory Visit: Payer: Medicare HMO | Admitting: Physical Therapy

## 2020-12-23 ENCOUNTER — Other Ambulatory Visit: Payer: Self-pay

## 2020-12-23 DIAGNOSIS — M25551 Pain in right hip: Secondary | ICD-10-CM | POA: Diagnosis not present

## 2020-12-23 DIAGNOSIS — M6281 Muscle weakness (generalized): Secondary | ICD-10-CM

## 2020-12-23 DIAGNOSIS — R252 Cramp and spasm: Secondary | ICD-10-CM | POA: Diagnosis not present

## 2020-12-23 DIAGNOSIS — M25511 Pain in right shoulder: Secondary | ICD-10-CM | POA: Diagnosis not present

## 2020-12-23 DIAGNOSIS — M25611 Stiffness of right shoulder, not elsewhere classified: Secondary | ICD-10-CM | POA: Diagnosis not present
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
## 2020-12-27 DIAGNOSIS — E119 Type 2 diabetes mellitus without complications: Secondary | ICD-10-CM | POA: Diagnosis not present

## 2020-12-28 ENCOUNTER — Other Ambulatory Visit: Payer: Self-pay

## 2020-12-28 ENCOUNTER — Ambulatory Visit: Payer: Medicare HMO | Admitting: Physical Therapy

## 2020-12-28 DIAGNOSIS — M25611 Stiffness of right shoulder, not elsewhere classified: Secondary | ICD-10-CM | POA: Diagnosis not present

## 2020-12-28 DIAGNOSIS — M6281 Muscle weakness (generalized): Secondary | ICD-10-CM | POA: Diagnosis not present

## 2020-12-28 DIAGNOSIS — M25511 Pain in right shoulder: Secondary | ICD-10-CM

## 2020-12-28 DIAGNOSIS — M25551 Pain in right hip: Secondary | ICD-10-CM | POA: Diagnosis not present

## 2020-12-28 DIAGNOSIS — R252 Cramp and spasm: Secondary | ICD-10-CM | POA: Diagnosis not present
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
## 2020-12-30 ENCOUNTER — Other Ambulatory Visit: Payer: Self-pay

## 2020-12-30 ENCOUNTER — Ambulatory Visit: Payer: Medicare HMO

## 2020-12-30 DIAGNOSIS — M25551 Pain in right hip: Secondary | ICD-10-CM | POA: Diagnosis not present

## 2020-12-30 DIAGNOSIS — R252 Cramp and spasm: Secondary | ICD-10-CM

## 2020-12-30 DIAGNOSIS — M6281 Muscle weakness (generalized): Secondary | ICD-10-CM

## 2020-12-30 DIAGNOSIS — M25511 Pain in right shoulder: Secondary | ICD-10-CM | POA: Diagnosis not present

## 2020-12-30 DIAGNOSIS — M25611 Stiffness of right shoulder, not elsewhere classified: Secondary | ICD-10-CM | POA: Diagnosis not present

## 2020-12-30 NOTE — Patient Instructions (Signed)
Access Code: [email or web address] URL: [email or web address] Date: 12/30/2020 Prepared by: Claiborne Billings   Standing Shoulder Internal Rotation Stretch with Towel - 2 x daily - 7 x weekly - 1 sets - 5 reps - 10 hold

## 2020-12-30 NOTE — Therapy (Signed)
[facility] [address], [place], [place], [address] Phone: [phone]   Fax:  [phone]  Physical Therapy Treatment  Patient Details  Name: Kelly [name] MRN: [id number] Date of Birth: 04-Feb-1941 Referring Provider (PT): [name], MD  Progress Note Reporting Period 11/30/20 to 12/30/20  See note below for Objective Data and Assessment of Progress/Goals.     Encounter Date: 12/30/2020  PT End of Session - 12/30/20 0928     Visit Number 10    Date for PT Re-Evaluation 01/25/21    Authorization Type [employer] Medicare  KX now    Authorization - Visit Number 10    Authorization - Number of Visits 24    Progress Note Due on Visit 20    PT Start Time 0846    PT Stop Time 0928    PT Time Calculation (min) 42 min    Activity Tolerance Patient tolerated treatment well;No increased pain    Behavior During Therapy WFL for tasks assessed/performed             Past Medical History:  Diagnosis Date   Allergy    Blood transfusion without reported diagnosis    Breast cancer ([place])    Breast mass    fibocystic breast dx   Cataract    Family history of colon cancer 04/15/2020   Family history of pancreatic cancer 04/15/2020   Family history of prostate cancer 04/15/2020   Hepatitis    CMV 1992   Hyperlipidemia    Postmenopausal HRT (hormone replacement therapy)    Scarlet fever as a teen   Sleep apnea     Past Surgical History:  Procedure Laterality Date   APPENDECTOMY     BREAST LUMPECTOMY WITH RADIOACTIVE SEED LOCALIZATION Right 04/27/2020   Procedure: RIGHT BREAST LUMPECTOMY WITH RADIOACTIVE SEED LOCALIZATION;  Surgeon: [name], MD;  Location: [place];  Service: General;  Laterality: Right;   BUNIONECTOMY     caesarean section     COLONOSCOPY     CORRECTION HAMMER TOE     FOOT TENDON SURGERY     left    POLYPECTOMY     TONSILLECTOMY     TONSILLECTOMY      There were no vitals filed for this  visit.   Subjective Assessment - 12/30/20 0854     Subjective Since my Rt pectoralis was released by the massage therapist, my shoulder has been good.  I went up and down the steps with normal pattern at the gym this week.  Shoulder is 90% better and hip 85% better.    Patient Stated Goals walk better; reach shelves better even with some weight (dishes from dishwasher plates 2nd shelf)    Currently in Pain? No/denies                [facility] PT Assessment - 12/30/20 0001       Assessment   Medical Diagnosis acute pain of right shoulder; rib pain on right side; fall    Referring Provider (PT) [name], MD    Onset Date/Surgical Date 11/06/20    Hand Dominance Right      Prior Function   Level of Independence Independent    Vocation Retired    Leisure play bridge; card games      Cognition   Overall Cognitive Status Within Functional Limits for tasks assessed      Observation/Other Assessments   Focus on Therapeutic Outcomes (FOTO)  61 (goal is 44)  AROM   Right Shoulder Flexion 160 Degrees    Right Shoulder ABduction 155 Degrees    Right Shoulder Internal Rotation --   to L5, A/AROM with towel to L1                          [facility] Adult PT Treatment/Exercise - 12/30/20 0001       Knee/Hip Exercises: Stretches   Other Knee/Hip Stretches 2nd step hip flexor stretch with UE over head reach 5x right/left      Knee/Hip Exercises: Machines for Strengthening   Cybex Leg Press seat 8 30# single leg 2x10 Rt and Lt   needs assist to maneuver right leg     Knee/Hip Exercises: Standing   Other Standing Knee Exercises step over 6" hurdle to increse hip flexor activation: forward and lateral 2x10      Shoulder Exercises: ROM/Strengthening   Nustep L2 x 8 min  seat 9    Other ROM/Strengthening Exercises IR stretch with towel x10                    PT Education - 12/30/20 0926     Education Details Access Code: [email or web address]    Person(s) Educated  Patient    Methods Explanation;Demonstration;Handout    Comprehension Returned demonstration;Verbalized understanding              PT Short Term Goals - 12/21/20 0924       PT SHORT TERM GOAL #1   Title Patient will be independent with basic shoulder and LE HEP for continued progression at home.    Status Achieved      PT SHORT TERM GOAL #2   Title The patient will have improved right shoulder flexion/scaption to 120 degrees needed for reaching overhead    Status Achieved      PT SHORT TERM GOAL #3   Title The patient will be able to transfer supine to sit without assist    Status Achieved      PT SHORT TERM GOAL #4   Title The patient will be able to rise from a standard chair with min UE assist 3 out of 5x    Time 4    Period Weeks    Status On-going               PT Long Term Goals - 12/30/20 0855       PT LONG TERM GOAL #1   Title Patient will be independent with advanced HEP for long term management of symptoms post D/C    Status On-going      PT LONG TERM GOAL #4   Title The patient will be able to lift dinner plates to 2nd shelf when unloading the dishwasher    Status Achieved      PT LONG TERM GOAL #5   Title The patient will report a 50% improvement in standing tolerance for cooking and cleaning the kitchen.    Baseline 85% better- limited for long periods of time    Status Achieved      PT LONG TERM GOAL #6   Title The patient will have improved right shoulder internal rotation to T9 needed to hook bra in the back    Baseline L5 with inability to reach higher, A/AROM with towel to L1    Status On-going      PT LONG TERM GOAL #7   Title The patient will be able to rise  from a standard chair without UE use 2 out of 3x    Status Achieved                   Plan - 12/30/20 0916     Clinical Impression Statement Pt reports 90% improvement in use of her Rt UE after massage therapist released her muscles in that region.  Lt shoulder  flexion is improved to 150 degrees and IR is limited to L5 (A/AROM is L1).  PT issued HEP to address IR flexibility today.  Pt reports 85% improvement in hip symptoms and is able to stand in kitchen without significant limitation now.  Pt is able to perform sit to stand without UEs and negotiated steps at the gym reciprocally yesterday, meeting goals for these activities. FOTO for shoulder is increased to 61 (54 at evaluation).  Pt requires assistance with placing Rt foot on the leg press due to hip flexor weakness.  Pt will continue to benefit from skilled PT to address Rt hip strength, Rt shoulder ROM and strength to improve function.    PT Frequency 2x / week    PT Duration 8 weeks    PT Treatment/Interventions Therapeutic exercise;Patient/family education;ADLs/Self Care Home Management;Manual techniques;Manual lymph drainage;Compression bandaging;Scar mobilization;Passive range of motion;Taping;Therapeutic activities;Functional mobility training;Neuromuscular re-education;Gait training;Balance training;Iontophoresis '4mg'$ /ml Dexamethasone;Dry needling;Spinal Manipulations;Joint Manipulations;Other (comment);Aquatic Therapy;Electrical Stimulation    PT Next Visit Plan Rt LE strength, shoulder ROM/strength    PT Home Exercise Plan Access Code: [email or web address]    Consulted and Agree with Plan of Care Patient             Patient will benefit from skilled therapeutic intervention in order to improve the following deficits and impairments:  Abnormal gait, Decreased activity tolerance, Decreased balance, Decreased endurance, Decreased mobility, Decreased range of motion, Decreased strength, Difficulty walking, Increased muscle spasms, Pain, Impaired UE functional use, Impaired perceived functional ability  Visit Diagnosis: Stiffness of right shoulder, not elsewhere classified  Muscle weakness (generalized)  Cramp and spasm  Pain in right hip     Problem List Patient Active Problem List   Diagnosis  Date Noted   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   Hepatic steatosis 09/10/2020   Genetic testing 04/26/2020   Family history of pancreatic cancer 04/15/2020   Family history of ovarian cancer 04/15/2020   Family history of prostate cancer 04/15/2020   Family history of colon cancer 04/15/2020   Malignant neoplasm of upper-outer quadrant of right breast in female, estrogen receptor positive ([place]) 04/05/2020   Degenerative disc disease, cervical 08/07/2019   Lumbar radiculopathy 04/11/2017   Bunion of great toe of right foot 04/18/2016   OSA on CPAP 12/23/2015   Urinary incontinence 03/31/2014   Fibrocystic breast disease 06/06/2011   Routine health maintenance 11/12/2010   Obesity 09/11/2007   Hyperlipidemia 06/10/2007    Sigurd Sos, PT 12/30/20 9:32 AM   [facility] [address], [place], [place], [address] Phone: [phone]   Fax:  [phone]  Name: Kelly [name] MRN: [id number] Date of Birth: Oct 09, 1940

## 2021-01-03 ENCOUNTER — Telehealth: Payer: Self-pay | Admitting: Internal Medicine

## 2021-01-03 NOTE — Telephone Encounter (Signed)
See below

## 2021-01-03 NOTE — Telephone Encounter (Signed)
   Does patient need labs prior to 9/7 appointment

## 2021-01-04 ENCOUNTER — Ambulatory Visit: Payer: Medicare HMO | Admitting: Physical Therapy

## 2021-01-04 ENCOUNTER — Other Ambulatory Visit: Payer: Self-pay

## 2021-01-04 DIAGNOSIS — M6281 Muscle weakness (generalized): Secondary | ICD-10-CM | POA: Diagnosis not present

## 2021-01-04 DIAGNOSIS — M25611 Stiffness of right shoulder, not elsewhere classified: Secondary | ICD-10-CM

## 2021-01-04 DIAGNOSIS — M25511 Pain in right shoulder: Secondary | ICD-10-CM | POA: Diagnosis not present

## 2021-01-04 DIAGNOSIS — M25551 Pain in right hip: Secondary | ICD-10-CM | POA: Diagnosis not present

## 2021-01-04 DIAGNOSIS — R252 Cramp and spasm: Secondary | ICD-10-CM | POA: Diagnosis not present
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
## 2021-01-04 NOTE — Patient Instructions (Signed)
Access Code: [email or web address] URL: [email or web address] Date: 01/04/2021 Prepared by: Ruben Im  Program Notes Perform for both legs   Exercises Standing Hip Abduction with Counter Support - 1 x daily - 7 x weekly - 2 sets - 10 reps Sit to Stand with Arm Reach Toward Target - 1 x daily - 7 x weekly - 2 sets - 5 reps Clamshell with Resistance - 1 x daily - 7 x weekly - 2 sets - 10 reps Standing Row with Anchored Resistance - 1 x daily - 7 x weekly - 2 sets - 10 reps Seated Piriformis Stretch with Trunk Bend - 3 x daily - 7 x weekly - 1 sets - 3 reps - 20 hold Hip Extension with Resistance Loop - 1 x daily - 7 x weekly - 2 sets - 5 reps Seated Hip Adduction Isometrics with Ball - 1 x daily - 7 x weekly - 2 sets - 10 reps - 5 hold Sidelying Shoulder External Rotation - 1 x daily - 7 x weekly - 2 sets - 12 reps Supine Hip External Rotation Stretch - 2 x daily - 7 x weekly - 1 sets - 2 reps Seated Hip External Rotation Stretch - 2 x daily - 7 x weekly - 1 sets - 2 reps Standing Shoulder Internal Rotation Stretch with Towel - 1 x daily - 7 x weekly - 1 sets - 10 reps - 10 hold Side-lying Shoulder PNF D2 Flexion and Extension - 2-3 x daily - 7 x weekly - 1 sets - 10 reps Supine Shoulder Flexion Extension AAROM with Dowel - 2-3 x daily - 7 x weekly - 1 sets - 10 reps Seated Shoulder Flexion Slide at Table Top with Forearm in Neutral - 2-3 x daily - 7 x weekly - 1 sets - 10 reps Seated Thoracic Lumbar Extension - 1 x daily - 7 x weekly - 1 sets - 10 reps Isometric Shoulder Extension at Wall (Mirrored) - 1 x daily - 7 x weekly - 1 sets - 5 reps - 5 hold Isometric Shoulder Abduction at Wall - 1 x daily - 7 x weekly - 1 sets - 5 reps - 5 hold Isometric Shoulder External Rotation at Wall - 1 x daily - 7 x weekly - 1 sets - 5 reps - 5 hold Standing Row with Anchored Resistance - 1 x daily - 7 x weekly - 1 sets - 10 reps Single Arm Shoulder Extension with Anchored Resistance - 1 x daily - 7  x weekly - 1 sets - 10 reps Standing Shoulder Internal Rotation Stretch with Towel - 2 x daily - 7 x weekly - 1 sets - 5 reps - 10 hold Seated Self Great Toe Stretch - 1 x daily - 7 x weekly - 1 sets - 1-3 reps - 30 hold

## 2021-01-04 NOTE — Telephone Encounter (Signed)
See my chart message

## 2021-01-04 NOTE — Telephone Encounter (Signed)
No, we will do POC HgA1c at visit.

## 2021-01-05 ENCOUNTER — Ambulatory Visit (INDEPENDENT_AMBULATORY_CARE_PROVIDER_SITE_OTHER): Payer: Medicare HMO

## 2021-01-05 ENCOUNTER — Other Ambulatory Visit: Payer: Self-pay

## 2021-01-05 ENCOUNTER — Ambulatory Visit: Payer: Medicare HMO | Admitting: Family Medicine

## 2021-01-05 VITALS — BP 106/78 | HR 83 | Ht 67.0 in | Wt 207.4 lb

## 2021-01-05 DIAGNOSIS — M79672 Pain in left foot: Secondary | ICD-10-CM | POA: Diagnosis not present

## 2021-01-05 DIAGNOSIS — R29898 Other symptoms and signs involving the musculoskeletal system: Secondary | ICD-10-CM | POA: Diagnosis not present

## 2021-01-05 DIAGNOSIS — M7989 Other specified soft tissue disorders: Secondary | ICD-10-CM | POA: Diagnosis not present

## 2021-01-05 NOTE — Progress Notes (Signed)
I, Peterson Lombard, LAT, ATC acting as a scribe for [name], MD.  Kelly [name] is a 80 y.o. female who presents to [place] [facility] today for f/u R shoulder, R rib, and R hip pain. MOI: Pt was coming out of the bathroom and caught her foot on the leg of the chair and landing on R side. Pt was last seen by Dr. Georgina Snell on 11/24/20 and was referred to PT, of which she's completed 11 visits. Today, pt reports improvement in her mobility and has been able to go up stairs alternating feet. Pt is now getting pain along the lateral aspect of L foot and plantar aspect. Pt has been seeing a massage therapist who has given her a list of muscles that required extra work this morning.   She has had some persistent weakness in both legs including bilateral hip flexion and left ankle dorsiflexion that has been known to physical therapy for some time now and currently improving with PT strengthening exercises.  Dx imaging: 11/24/20 R shoulder & R rib XR 08/26/20 Renal US             08/08/20 R shoulder & L-spine MRI             07/30/20 L-spine & c-spine XR             07/06/20 R shoulder XR, pelvis XR, & L-spine XR  Pertinent review of systems: No fevers or chills  Relevant historical information: No fevers or chills   Exam:  BP 106/78   Pulse 83   Ht '5\' 7"'$  (1.702 m)   Wt 207 lb 6.4 oz (94.1 kg)   SpO2 96%   BMI 32.48 kg/m  General: Well Developed, well nourished, and in no acute distress.   MSK: Flexion diminished bilaterally 4/5. Knee extension diminished bilaterally 4+/5  Left foot dorsiflexion diminished 4+/5. Otherwise strength is intact Reflexes are intact Sensation is intact. Left foot normal. Mildly tender palpation along medial midfoot near tibialis anterior insertion. Also tender palpation lateral ankle along the course of the peroneal tendons.    Lab and Radiology Results  X-ray images left foot obtained today personally and independently  interpreted Intact surgical hardware first metatarsal.  No acute fractures.  Mild degenerative changes. Await formal radiology review   EXAM: MRI LUMBAR SPINE WITHOUT CONTRAST   TECHNIQUE: Multiplanar, multisequence MR imaging of the lumbar spine was performed. No intravenous contrast was administered.   COMPARISON:  07/30/2020 and prior.   FINDINGS: Segmentation:  Standard.   Alignment: Trace L1-2, L5-S1 retrolisthesis. Trace L3-4 anterolisthesis.   Vertebrae: Modic type 2 endplate degenerative changes. Scattered hemangiomata. No fracture or aggressive osseous lesion.   Conus medullaris and cauda equina: Conus extends to the L2 level. Conus and cauda equina appear normal.   Disc levels: Multilevel desiccation.   L1-2: Minimal disc bulge with shallow left lateral protrusion. Patent spinal canal and right neural foramen. Mild left neural foraminal narrowing.   L2-3: No significant disc bulge. Patent spinal canal and neural foramen.   L3-4: Minimal disc bulge and bilateral facet hypertrophy. Prominent dorsal epidural fat. Mild spinal canal narrowing. Patent neural foramen.   L4-5: Minimal disc bulge and bilateral facet hypertrophy. Prominent dorsal epidural fat. Mild spinal canal and bilateral neural foraminal narrowing.   L5-S1: Minimal disc bulge. Bilateral facet degenerative spurring. Patent spinal canal and left neural foramen. Mild right neural foraminal narrowing.   Paraspinal and other soft tissues: Right renal cyst. Partially imaged  left adrenal adenoma.   IMPRESSION: Mild spinal canal narrowing at the L3-4, L4-5 levels.   Mild left L1-2, bilateral L4-5 and right L5-S1 neural foraminal narrowing.     Electronically Signed   By: Primitivo Gauze M.D.   On: 08/09/2020 08:22 I, [name], personally (independently) visualized and performed the interpretation of the images attached in this note.   Assessment and Plan: 80 y.o. female visiting today  for follow-up for her flank pain after a fall.  Overall she is improving.  The weakness identified on exam today per PT notes that is old and improving with physical therapy focused on gait training and fall prevention.  She notes overall she is improving and happy with how things are going.  I cannot explain the hip flexor weakness based on MRI lumbar spine from March.  If she worsens or is not improving we may need to repeat lumbar spine MRI to further characterize potential cause of weakness.  However for now she is improving so watchful waiting with continued PT and home exercise.   She also notes a bit of worsening left foot pain likely thought to be due to increase tendinopathy at tibialis anterior tendon and peroneal tendons.  Plan for home exercise program taught in clinic today and continued PT.  Reassess in 1 month.   PDMP not reviewed this encounter. Orders Placed This Encounter  Procedures   DG Foot Complete Left    Standing Status:   Future    Number of Occurrences:   1    Standing Expiration Date:   01/05/2022    Order Specific Question:   Reason for Exam (SYMPTOM  OR DIAGNOSIS REQUIRED)    Answer:   eval left foot pain    Order Specific Question:   Preferred imaging location?    Answer:   [facility]   No orders of the defined types were placed in this encounter.    Discussed warning signs or symptoms. Please see discharge instructions. Patient expresses understanding.   The above documentation has been reviewed and is accurate and complete [name], M.D.

## 2021-01-05 NOTE — Patient Instructions (Signed)
Thank you for coming in today.   I think you have a problem with the tendons in the foot.   Lets work on home exercises.   Recheck in 1 month.   Let me know if this is not working.   I think you are improving.  Kelly [name]

## 2021-01-06 ENCOUNTER — Ambulatory Visit: Payer: Medicare HMO

## 2021-01-06 DIAGNOSIS — M6281 Muscle weakness (generalized): Secondary | ICD-10-CM | POA: Diagnosis not present

## 2021-01-06 DIAGNOSIS — M25551 Pain in right hip: Secondary | ICD-10-CM

## 2021-01-06 DIAGNOSIS — R252 Cramp and spasm: Secondary | ICD-10-CM | POA: Diagnosis not present

## 2021-01-06 DIAGNOSIS — M25611 Stiffness of right shoulder, not elsewhere classified: Secondary | ICD-10-CM | POA: Diagnosis not present

## 2021-01-06 DIAGNOSIS — M25511 Pain in right shoulder: Secondary | ICD-10-CM | POA: Diagnosis not present
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
## 2021-01-07 NOTE — Progress Notes (Signed)
Left foot x-ray shows a possible toe fracture and dislocation at the pinky toe.  However [place] of your foot exam is that you were not particularly painful there majority of your pain was in your midfoot.  I do not know how old this injury is.  I think we probably should see each other in the near future.  Recommend scheduling with me next week.

## 2021-01-10 ENCOUNTER — Other Ambulatory Visit: Payer: Self-pay

## 2021-01-10 ENCOUNTER — Encounter: Payer: Self-pay | Admitting: Family Medicine

## 2021-01-10 ENCOUNTER — Ambulatory Visit: Payer: Medicare HMO | Admitting: Family Medicine

## 2021-01-10 VITALS — BP 102/64 | HR 82 | Ht 67.0 in | Wt 208.0 lb

## 2021-01-10 DIAGNOSIS — S93105A Unspecified dislocation of left toe(s), initial encounter: Secondary | ICD-10-CM

## 2021-01-10 NOTE — Progress Notes (Signed)
   I, [name], LAT, ATC, am serving as scribe for Dr. [name].  Kelly [name] is a 80 y.o. female who presents to [place] at [facility] today for f/u of L lateral and plantar foot pain.  She was last seen by Dr. Georgina Snell on 01/05/21 for f/u of multiple injuries that occurred as a result of a fall she suffered in early July 2022 and noted worsening L foot pain.  She had an XR at the end of her visit that revealed a L 5th toe PIP dislocation and a possible nondisplaced fx of her 5th proximal phalangeal head.  Today, pt reports that her L foot remains about the same.  She states that she went to gym this morning.    She notes her left fifth toe is not painful at all.  She cannot recall when she may have injured it.  She thinks she did not hurt it during the fall in July or early June.  Diagnostic imaging: L foot XR- 01/05/21  Pertinent review of systems: No fevers or chills  Relevant historical information: History of breast cancer   Exam:  BP 102/64 (BP Location: Right Arm, Patient Position: Sitting, Cuff Size: Normal)   Pulse 82   Ht '5\' 7"'$  (1.702 m)   Wt 208 lb (94.3 kg)   SpO2 94%   BMI 32.58 kg/m  General: Well Developed, well nourished, and in no acute distress.   MSK: Great toe normal-appearing nontender. With palpation palpable step-off at PIP. Unable to easily reduce the dislocation.    Lab and Radiology Results  EXAM: LEFT FOOT - COMPLETE 3+ VIEW   COMPARISON:  None.   FINDINGS: The fifth proximal interphalangeal joint is dislocated with dorsomedial displacement of the middle phalanx. There may be an associated fracture of the head of fifth proximal phalanx. No additional evidence of acute fracture. Postoperative changes involving the first metatarsal. Moderate first metatarsophalangeal joint osteoarthritis. Calcaneal spur.   IMPRESSION: 1. Dislocated fifth proximal interphalangeal joint. Possible nondisplaced fracture of fifth proximal  phalangeal head. 2. First metatarsophalangeal joint osteoarthritis.     Electronically Signed   By: Lorin Picket M.D.   On: 01/06/2021 10:21 I, [name], personally (independently) visualized and performed the interpretation of the images attached in this note.     Assessment and Plan: 80 y.o. female with left fifth toe dislocation.  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  Her left fifth toe does not bother her and it is not easily reducible in clinic.  We had a discussion about what to do about it.  After a lengthy discussion we decided watchful waiting.  Certainly a more thorough attempt at reduction could be attempted however that might actually make her worse.  Ultimately if needed surgery would be helpful here.  However I think keep an eye on her toe and watchful waiting seems to be working just fine for now.  Recheck as scheduled for her other issues in late September.    Discussed warning signs or symptoms. Please see discharge instructions. Patient expresses understanding.   The above documentation has been reviewed and is accurate and complete [name], M.D.

## 2021-01-10 NOTE — Patient Instructions (Addendum)
Thank you for coming in today.   That toe is dislocated but I think it will be ok as it does not seem to bother you.   Use a toe spacer for the right foot.   Recheck with me as scheduled on Sep 28th.

## 2021-01-11 ENCOUNTER — Ambulatory Visit: Payer: Medicare HMO

## 2021-01-11 DIAGNOSIS — R252 Cramp and spasm: Secondary | ICD-10-CM

## 2021-01-11 DIAGNOSIS — M6281 Muscle weakness (generalized): Secondary | ICD-10-CM

## 2021-01-11 DIAGNOSIS — M25511 Pain in right shoulder: Secondary | ICD-10-CM | POA: Diagnosis not present

## 2021-01-11 DIAGNOSIS — M25611 Stiffness of right shoulder, not elsewhere classified: Secondary | ICD-10-CM

## 2021-01-11 DIAGNOSIS — M25551 Pain in right hip: Secondary | ICD-10-CM

## 2021-01-11 NOTE — Therapy (Signed)
[facility] [address] [place], [place], [address] Phone: [phone]   Fax:  [phone]  Physical Therapy Treatment  Patient Details  Name: Kelly [name] MRN: [id number] Date of Birth: May 06, 1941 Referring Provider (PT): [name], MD   Encounter Date: 01/11/2021   PT End of Session - 01/11/21 0942     Visit Number 13    Date for PT Re-Evaluation 01/25/21    Authorization Type Aetna Medicare  KX now    Authorization Time Period KX now since had previous PT this year    Authorization - Visit Number 20    Progress Note Due on Visit 20    PT Start Time 0847    PT Stop Time [id number]    PT Time Calculation (min) 51 min    Activity Tolerance Patient tolerated treatment well;No increased pain    Behavior During Therapy WFL for tasks assessed/performed             Past Medical History:  Diagnosis Date   Allergy    Blood transfusion without reported diagnosis    Breast cancer ([place])    Breast mass    fibocystic breast dx   Cataract    Family history of colon cancer 04/15/2020   Family history of pancreatic cancer 04/15/2020   Family history of prostate cancer 04/15/2020   Hepatitis    CMV 1992   Hyperlipidemia    Postmenopausal HRT (hormone replacement therapy)    Scarlet fever as a teen   Sleep apnea     Past Surgical History:  Procedure Laterality Date   APPENDECTOMY     BREAST LUMPECTOMY WITH RADIOACTIVE SEED LOCALIZATION Right 04/27/2020   Procedure: RIGHT BREAST LUMPECTOMY WITH RADIOACTIVE SEED LOCALIZATION;  Surgeon: [name], MD;  Location: [facility];  Service: General;  Laterality: Right;   BUNIONECTOMY     caesarean section     COLONOSCOPY     CORRECTION HAMMER TOE     FOOT TENDON SURGERY     left    POLYPECTOMY     TONSILLECTOMY     TONSILLECTOMY      There were no vitals filed for this visit.   Subjective Assessment - 01/11/21 0851     Subjective I saw the MD, dislocation and  possible fracture on Lt 5th toe.  I sometimes wake up and my Rt fingers are tingling.    Pertinent History Breast cancer, HLD, hepatitis;  foot issues (flat arches); rod in foot from bunionectomy; piriformis issues in the past    Patient Stated Goals walk better; reach shelves better even with some weight (dishes from dishwasher plates 2nd shelf)    Currently in Pain? Yes    Pain Score 4     Pain Location Shoulder    Pain Orientation Right    Pain Descriptors / Indicators Sore;Tightness    Pain Type Chronic pain    Pain Frequency Constant    Aggravating Factors  waking up at night, holding phone    Pain Relieving Factors rest, change of position    Pain Score 0    Pain Location Hip                               [facility] Adult PT Treatment/Exercise - 01/11/21 0001       Knee/Hip Exercises: Stretches   Other Knee/Hip Stretches 2nd step hip flexor stretch with UE over head reach 5x right/left  Knee/Hip Exercises: Standing   Forward Step Up Limitations step tap on 2nd step x15 Rt and Lt- much improvement in stabilizing with this today    Other Standing Knee Exercises step over 6 inch hurdle 10x right/left      Shoulder Exercises: Seated   External Rotation Strengthening;Both;20 reps;Theraband    Theraband Level (Shoulder External Rotation) Level 2 (Red)      Shoulder Exercises: ROM/Strengthening   Nustep L2 8 min while discussing status/progress      Manual Therapy   Manual Therapy Soft tissue mobilization    Manual therapy comments skilled palpation and monitoring with dry needling    Soft tissue mobilization elongation and release to Rt upper trap, posterior Rt shoulder and rhomboids              Trigger Point Dry Needling - 01/11/21 0001     Consent Given? Yes    Education Handout Provided Yes    Muscles Treated Head and Neck Upper trapezius   Rt   Muscles Treated Upper Quadrant Rhomboids;Teres minor   Rt   Upper Trapezius Response Twitch reponse  elicited;Palpable increased muscle length    Rhomboids Response Twitch response elicited;Palpable increased muscle length    Teres minor Response Twitch response elicited;Palpable increased muscle length                  PT Education - 01/11/21 0903     Education Details DN info    Person(s) Educated Patient    Methods Explanation;Demonstration;Handout    Comprehension Verbalized understanding;Returned demonstration              PT Short Term Goals - 12/21/20 0924       PT SHORT TERM GOAL #1   Title Patient will be independent with basic shoulder and LE HEP for continued progression at home.    Status Achieved      PT SHORT TERM GOAL #2   Title The patient will have improved right shoulder flexion/scaption to 120 degrees needed for reaching overhead    Status Achieved      PT SHORT TERM GOAL #3   Title The patient will be able to transfer supine to sit without assist    Status Achieved      PT SHORT TERM GOAL #4   Title The patient will be able to rise from a standard chair with min UE assist 3 out of 5x    Time 4    Period Weeks    Status On-going               PT Long Term Goals - 12/30/20 0855       PT LONG TERM GOAL #1   Title Patient will be independent with advanced HEP for long term management of symptoms post D/C    Status On-going      PT LONG TERM GOAL #4   Title The patient will be able to lift dinner plates to 2nd shelf when unloading the dishwasher    Status Achieved      PT LONG TERM GOAL #5   Title The patient will report a 50% improvement in standing tolerance for cooking and cleaning the kitchen.    Baseline 85% better- limited for long periods of time    Status Achieved      PT LONG TERM GOAL #6   Title The patient will have improved right shoulder internal rotation to T9 needed to hook bra in the back  Baseline L5 with inability to reach higher, A/AROM with towel to L1    Status On-going      PT LONG TERM GOAL #7    Title The patient will be able to rise from a standard chair without UE use 2 out of 3x    Status Achieved                   Plan - 01/11/21 [id number]     Clinical Impression Statement Pt reports 85% overall improvement in function since the start of care.  Pt has dislocation of 5th toe on the Lt with possible fracture.  Pt is most challenged with negotiating steps with initiation of Rt hip flexion and reaching overhead with Rt UE with functional tasks. Pt did well with 6" hurdle step overs andwas able to step tap to 2nd step today without UE support and stabilized well.  Pt with Rt posterior shoulder pain with trigger points in the external rotators and scapular musculature.  Good twitch response and improved tissue mobility after dry needling today.   Pt requires intermittent tactile and verbal cues for technique with activity in the clinic today.  She will continue to benefit from skilled PT to address Rt shoulder and hip pain, hip flexor activation for improved mobility.    PT Frequency 2x / week    PT Duration 8 weeks    PT Treatment/Interventions Therapeutic exercise;Patient/family education;ADLs/Self Care Home Management;Manual techniques;Manual lymph drainage;Compression bandaging;Scar mobilization;Passive range of motion;Taping;Therapeutic activities;Functional mobility training;Neuromuscular re-education;Gait training;Balance training;Iontophoresis '4mg'$ /ml Dexamethasone;Dry needling;Spinal Manipulations;Joint Manipulations;Other (comment);Aquatic Therapy;Electrical Stimulation    PT Next Visit Plan Rt LE strength especially hip flexion, shoulder ROM/strength.  See how pt responded to dry needling    PT Home Exercise Plan Access Code: [email or web address]    Consulted and Agree with Plan of Care Patient             Patient will benefit from skilled therapeutic intervention in order to improve the following deficits and impairments:  Abnormal gait, Decreased activity tolerance, Decreased balance,  Decreased endurance, Decreased mobility, Decreased range of motion, Decreased strength, Difficulty walking, Increased muscle spasms, Pain, Impaired UE functional use, Impaired perceived functional ability  Visit Diagnosis: Stiffness of right shoulder, not elsewhere classified  Muscle weakness (generalized)  Cramp and spasm  Pain in right hip     Problem List Patient Active Problem List   Diagnosis Date Noted   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   Hepatic steatosis 09/10/2020   Genetic testing 04/26/2020   Family history of pancreatic cancer 04/15/2020   Family history of ovarian cancer 04/15/2020   Family history of prostate cancer 04/15/2020   Family history of colon cancer 04/15/2020   Malignant neoplasm of upper-outer quadrant of right breast in female, estrogen receptor positive ([place]) 04/05/2020   Degenerative disc disease, cervical 08/07/2019   Lumbar radiculopathy 04/11/2017   Bunion of great toe of right foot 04/18/2016   OSA on CPAP 12/23/2015   Urinary incontinence 03/31/2014   Fibrocystic breast disease 06/06/2011   Routine health maintenance 11/12/2010   Obesity 09/11/2007   Hyperlipidemia 06/10/2007   Sigurd Sos, PT 01/11/21 9:47 AM   [facility] [address] [place], [place], [address] Phone: [phone]   Fax:  [phone]  Name: Zaharah Boudreaux MRN: [id number] Date of Birth: Sep 07, 1940

## 2021-01-11 NOTE — Patient Instructions (Signed)

## 2021-01-13 ENCOUNTER — Other Ambulatory Visit: Payer: Self-pay

## 2021-01-13 ENCOUNTER — Ambulatory Visit: Payer: Medicare HMO | Attending: Family Medicine

## 2021-01-13 DIAGNOSIS — R252 Cramp and spasm: Secondary | ICD-10-CM | POA: Diagnosis not present

## 2021-01-13 DIAGNOSIS — M25511 Pain in right shoulder: Secondary | ICD-10-CM | POA: Insufficient documentation

## 2021-01-13 DIAGNOSIS — M25611 Stiffness of right shoulder, not elsewhere classified: Secondary | ICD-10-CM | POA: Insufficient documentation

## 2021-01-13 DIAGNOSIS — M25552 Pain in left hip: Secondary | ICD-10-CM | POA: Diagnosis not present

## 2021-01-13 DIAGNOSIS — M25551 Pain in right hip: Secondary | ICD-10-CM | POA: Insufficient documentation

## 2021-01-13 DIAGNOSIS — M6281 Muscle weakness (generalized): Secondary | ICD-10-CM | POA: Insufficient documentation

## 2021-01-13 NOTE — Therapy (Signed)
[facility] [address] [place], [place], [address] Phone: [phone]   Fax:  [phone]  Physical Therapy Treatment  Patient Details  Name: Shamona Brunelli MRN: [id number] Date of Birth: 01/22/1941 Referring Provider (PT): [name], MD   Encounter Date: 01/13/2021   PT End of Session - 01/13/21 0927     Visit Number 14    Date for PT Re-Evaluation 01/25/21    Authorization Type [employer]  KX now    Authorization Time Period KX now since had previous PT this year    Authorization - Visit Number 20    Progress Note Due on Visit 20    PT Start Time 0848    PT Stop Time 0927    PT Time Calculation (min) 39 min    Activity Tolerance Patient tolerated treatment well;No increased pain    Behavior During Therapy WFL for tasks assessed/performed             Past Medical History:  Diagnosis Date   Allergy    Blood transfusion without reported diagnosis    Breast cancer ([place])    Breast mass    fibocystic breast dx   Cataract    Family history of colon cancer 04/15/2020   Family history of pancreatic cancer 04/15/2020   Family history of prostate cancer 04/15/2020   Hepatitis    CMV 1992   Hyperlipidemia    Postmenopausal HRT (hormone replacement therapy)    Scarlet fever as a teen   Sleep apnea     Past Surgical History:  Procedure Laterality Date   APPENDECTOMY     BREAST LUMPECTOMY WITH RADIOACTIVE SEED LOCALIZATION Right 04/27/2020   Procedure: RIGHT BREAST LUMPECTOMY WITH RADIOACTIVE SEED LOCALIZATION;  Surgeon: [name], MD;  Location: [facility];  Service: General;  Laterality: Right;   BUNIONECTOMY     caesarean section     COLONOSCOPY     CORRECTION HAMMER TOE     FOOT TENDON SURGERY     left    POLYPECTOMY     TONSILLECTOMY     TONSILLECTOMY      There were no vitals filed for this visit.   Subjective Assessment - 01/13/21 0851     Subjective I felt good after dry needling  last session.  I will see my massage therapist today.    Pertinent History Breast cancer, HLD, hepatitis;  foot issues (flat arches); rod in foot from bunionectomy; piriformis issues in the past    Pain Score 2     Pain Location Shoulder    Pain Orientation Right    Pain Descriptors / Indicators Sore;Tightness                               [facility] Adult PT Treatment/Exercise - 01/13/21 0001       Knee/Hip Exercises: Stretches   Other Knee/Hip Stretches 2nd step hip flexor stretch with UE over head reach 5x right/left      Knee/Hip Exercises: Aerobic   Stationary Bike Level 1x 5 minutes -PT present to discuss progress      Knee/Hip Exercises: Standing   Forward Step Up Limitations step tap on 2nd step x15 Rt and Lt- much improvement in stabilizing with this today    Other Standing Knee Exercises step over 6 inch hurdle 10x right/left      Knee/Hip Exercises: Seated   Sit to Sand 20 reps;without UE support  holding 5# kettle bell and pressing out upon standing     Shoulder Exercises: Seated   External Rotation Strengthening;Both;20 reps;Theraband    Theraband Level (Shoulder External Rotation) Level 2 (Red)                      PT Short Term Goals - 12/21/20 0924       PT SHORT TERM GOAL #1   Title Patient will be independent with basic shoulder and LE HEP for continued progression at home.    Status Achieved      PT SHORT TERM GOAL #2   Title The patient will have improved right shoulder flexion/scaption to 120 degrees needed for reaching overhead    Status Achieved      PT SHORT TERM GOAL #3   Title The patient will be able to transfer supine to sit without assist    Status Achieved      PT SHORT TERM GOAL #4   Title The patient will be able to rise from a standard chair with min UE assist 3 out of 5x    Time 4    Period Weeks    Status On-going               PT Long Term Goals - 12/30/20 0855       PT LONG TERM GOAL #1    Title Patient will be independent with advanced HEP for long term management of symptoms post D/C    Status On-going      PT LONG TERM GOAL #4   Title The patient will be able to lift dinner plates to 2nd shelf when unloading the dishwasher    Status Achieved      PT LONG TERM GOAL #5   Title The patient will report a 50% improvement in standing tolerance for cooking and cleaning the kitchen.    Baseline 85% better- limited for long periods of time    Status Achieved      PT LONG TERM GOAL #6   Title The patient will have improved right shoulder internal rotation to T9 needed to hook bra in the back    Baseline L5 with inability to reach higher, A/AROM with towel to L1    Status On-going      PT LONG TERM GOAL #7   Title The patient will be able to rise from a standard chair without UE use 2 out of 3x    Status Achieved                   Plan - 01/13/21 0912     Clinical Impression Statement Pt responded well to DN to the Rt shoulder and reports some reduction in symptoms.   Pt is most challenged with negotiating steps with initiation of Rt hip flexion and reaching overhead with Rt UE with functional tasks. Pt did well with weight on her Rt wrist with finger ladder to simulate reaching overhead at home for functional tasks.  Pt did well with 6" hurdle step overs this week and was able to step tap to 2nd step again today without UE support and stabilized well. Pt required an elevated surface with sit to stand due to reduced eccentric control. Moderate cueing required for technique.   Pt requires intermittent tactile and verbal cues for technique with activity in the clinic today.  She will continue to benefit from skilled PT to address Rt shoulder and hip pain, hip flexor activation  for improved mobility.    PT Frequency 2x / week    PT Duration 8 weeks    PT Treatment/Interventions Therapeutic exercise;Patient/family education;ADLs/Self Care Home Management;Manual  techniques;Manual lymph drainage;Compression bandaging;Scar mobilization;Passive range of motion;Taping;Therapeutic activities;Functional mobility training;Neuromuscular re-education;Gait training;Balance training;Iontophoresis '4mg'$ /ml Dexamethasone;Dry needling;Spinal Manipulations;Joint Manipulations;Other (comment);Aquatic Therapy;Electrical Stimulation    PT Next Visit Plan Rt LE strength especially hip flexion, shoulder ROM/strength.  DN again to shoulder if needed    PT Home Exercise Plan Access Code: [email or web address]    Consulted and Agree with Plan of Care Patient             Patient will benefit from skilled therapeutic intervention in order to improve the following deficits and impairments:  Abnormal gait, Decreased activity tolerance, Decreased balance, Decreased endurance, Decreased mobility, Decreased range of motion, Decreased strength, Difficulty walking, Increased muscle spasms, Pain, Impaired UE functional use, Impaired perceived functional ability  Visit Diagnosis: Muscle weakness (generalized)  Stiffness of right shoulder, not elsewhere classified  Cramp and spasm  Pain in right hip  Acute pain of right shoulder     Problem List Patient Active Problem List   Diagnosis Date Noted   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   Hepatic steatosis 09/10/2020   Genetic testing 04/26/2020   Family history of pancreatic cancer 04/15/2020   Family history of ovarian cancer 04/15/2020   Family history of prostate cancer 04/15/2020   Family history of colon cancer 04/15/2020   Malignant neoplasm of upper-outer quadrant of right breast in female, estrogen receptor positive ([place]) 04/05/2020   Degenerative disc disease, cervical 08/07/2019   Lumbar radiculopathy 04/11/2017   Bunion of great toe of right foot 04/18/2016   OSA on CPAP 12/23/2015   Urinary incontinence 03/31/2014   Fibrocystic breast disease 06/06/2011   Routine health maintenance 11/12/2010   Obesity 09/11/2007    Hyperlipidemia 06/10/2007    Sigurd Sos, PT 01/13/21 9:30 AM   [facility] [address], [place], [place], [address] Phone: [phone]   Fax:  [phone]  Name: Kampbell Difelice MRN: [id number] Date of Birth: [birth year]

## 2021-01-14 ENCOUNTER — Other Ambulatory Visit: Payer: Self-pay | Admitting: *Deleted

## 2021-01-14 DIAGNOSIS — C50411 Malignant neoplasm of upper-outer quadrant of right female breast: Secondary | ICD-10-CM

## 2021-01-14 DIAGNOSIS — Z170 Estrogen receptor positive status [ER+]: Secondary | ICD-10-CM

## 2021-01-18 ENCOUNTER — Other Ambulatory Visit: Payer: Medicare HMO

## 2021-01-18 ENCOUNTER — Telehealth: Payer: Self-pay | Admitting: *Deleted

## 2021-01-18 ENCOUNTER — Ambulatory Visit: Payer: Medicare HMO | Admitting: Physical Therapy

## 2021-01-18 ENCOUNTER — Inpatient Hospital Stay: Payer: Medicare HMO

## 2021-01-18 ENCOUNTER — Inpatient Hospital Stay: Payer: Medicare HMO | Attending: Oncology | Admitting: Adult Health

## 2021-01-18 ENCOUNTER — Other Ambulatory Visit: Payer: Self-pay

## 2021-01-18 ENCOUNTER — Encounter: Payer: Self-pay | Admitting: Adult Health

## 2021-01-18 VITALS — BP 113/67 | HR 71 | Temp 97.5°F | Resp 18 | Wt 207.4 lb

## 2021-01-18 DIAGNOSIS — Z170 Estrogen receptor positive status [ER+]: Secondary | ICD-10-CM | POA: Diagnosis not present

## 2021-01-18 DIAGNOSIS — Z79811 Long term (current) use of aromatase inhibitors: Secondary | ICD-10-CM | POA: Insufficient documentation

## 2021-01-18 DIAGNOSIS — M25552 Pain in left hip: Secondary | ICD-10-CM | POA: Diagnosis not present

## 2021-01-18 DIAGNOSIS — R252 Cramp and spasm: Secondary | ICD-10-CM | POA: Diagnosis not present

## 2021-01-18 DIAGNOSIS — M25551 Pain in right hip: Secondary | ICD-10-CM

## 2021-01-18 DIAGNOSIS — M6281 Muscle weakness (generalized): Secondary | ICD-10-CM | POA: Diagnosis not present

## 2021-01-18 DIAGNOSIS — M25511 Pain in right shoulder: Secondary | ICD-10-CM | POA: Diagnosis not present

## 2021-01-18 DIAGNOSIS — M25611 Stiffness of right shoulder, not elsewhere classified: Secondary | ICD-10-CM | POA: Diagnosis not present

## 2021-01-18 DIAGNOSIS — C50411 Malignant neoplasm of upper-outer quadrant of right female breast: Secondary | ICD-10-CM | POA: Diagnosis not present

## 2021-01-18 DIAGNOSIS — E2839 Other primary ovarian failure: Secondary | ICD-10-CM

## 2021-01-18 LAB — CMP (CANCER CENTER ONLY)
ALT: 15 U/L (ref 0–44)
AST: 14 U/L — ABNORMAL LOW (ref 15–41)
Albumin: 3.9 g/dL (ref 3.5–5.0)
Alkaline Phosphatase: 61 U/L (ref 38–126)
Anion gap: 10 (ref 5–15)
BUN: 18 mg/dL (ref 8–23)
CO2: 27 mmol/L (ref 22–32)
Calcium: 10 mg/dL (ref 8.9–10.3)
Chloride: 105 mmol/L (ref 98–111)
Creatinine: 1.02 mg/dL — ABNORMAL HIGH (ref 0.44–1.00)
GFR, Estimated: 56 mL/min — ABNORMAL LOW (ref >60)
Glucose, Bld: 93 mg/dL (ref 70–99)
Potassium: 3.9 mmol/L (ref 3.5–5.1)
Sodium: 142 mmol/L (ref 135–145)
Total Bilirubin: 0.4 mg/dL (ref 0.3–1.2)
Total Protein: 7.2 g/dL (ref 6.5–8.1)

## 2021-01-18 LAB — CBC WITH DIFFERENTIAL (CANCER CENTER ONLY)
Abs Immature Granulocytes: 0.01 10*3/uL (ref 0.00–0.07)
Basophils Absolute: 0 10*3/uL (ref 0.0–0.1)
Basophils Relative: 1 %
Eosinophils Absolute: 0.1 10*3/uL (ref 0.0–0.5)
Eosinophils Relative: 2 %
HCT: 38.5 % (ref 36.0–46.0)
Hemoglobin: 12.8 g/dL (ref 12.0–15.0)
Immature Granulocytes: 0 %
Lymphocytes Relative: 41 %
Lymphs Abs: 2.7 10*3/uL (ref 0.7–4.0)
MCH: 29.5 pg (ref 26.0–34.0)
MCHC: 33.2 g/dL (ref 30.0–36.0)
MCV: 88.7 fL (ref 80.0–100.0)
Monocytes Absolute: 0.6 10*3/uL (ref 0.1–1.0)
Monocytes Relative: 9 %
Neutro Abs: 3.2 10*3/uL (ref 1.7–7.7)
Neutrophils Relative %: 47 %
Platelet Count: 319 10*3/uL (ref 150–400)
RBC: 4.34 MIL/uL (ref 3.87–5.11)
RDW: 12.7 % (ref 11.5–15.5)
WBC Count: 6.6 10*3/uL (ref 4.0–10.5)
nRBC: 0 % (ref 0.0–0.2)

## 2021-01-18 MED ORDER — MINOXIDIL 2.5 MG PO TABS: 1.2500 mg | ORAL_TABLET | Freq: Every day | ORAL | 0 refills | Status: DC
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
## 2021-01-18 NOTE — Telephone Encounter (Signed)
Orders for mammogram and bone density faxed to [facility].

## 2021-01-19 ENCOUNTER — Ambulatory Visit (INDEPENDENT_AMBULATORY_CARE_PROVIDER_SITE_OTHER): Payer: Medicare HMO | Admitting: Internal Medicine

## 2021-01-19 ENCOUNTER — Encounter: Payer: Self-pay | Admitting: Internal Medicine

## 2021-01-19 ENCOUNTER — Ambulatory Visit: Payer: Medicare HMO | Admitting: Internal Medicine

## 2021-01-19 VITALS — BP 116/78 | HR 82 | Temp 98.3°F | Resp 18 | Ht 67.0 in | Wt 205.8 lb

## 2021-01-19 DIAGNOSIS — R7303 Prediabetes: Secondary | ICD-10-CM | POA: Diagnosis not present

## 2021-01-19 LAB — POCT GLYCOSYLATED HEMOGLOBIN (HGB A1C): Hemoglobin A1C: 6 % — AB (ref 4.0–5.6)

## 2021-01-19 NOTE — Progress Notes (Signed)
   Subjective:   Patient ID: Kelly [name], female    DOB: 04/16/1941, 80 y.o.   MRN: [id number]  HPI The patient is a 80 YO female coming in for follow up sugars.  Review of Systems  Constitutional: Negative.   HENT: Negative.    Eyes: Negative.   Respiratory:  Negative for cough, chest tightness and shortness of breath.   Cardiovascular:  Negative for chest pain, palpitations and leg swelling.  Gastrointestinal:  Negative for abdominal distention, abdominal pain, constipation, diarrhea, nausea and vomiting.  Musculoskeletal: Negative.   Skin: Negative.   Neurological: Negative.   Psychiatric/Behavioral: Negative.     Objective:  Physical Exam Constitutional:      Appearance: She is well-developed.  HENT:     Head: Normocephalic and atraumatic.  Cardiovascular:     Rate and Rhythm: Normal rate and regular rhythm.  Pulmonary:     Effort: Pulmonary effort is normal. No respiratory distress.     Breath sounds: Normal breath sounds. No wheezing or rales.  Abdominal:     General: Bowel sounds are normal. There is no distension.     Palpations: Abdomen is soft.     Tenderness: There is no abdominal tenderness. There is no rebound.  Musculoskeletal:     Cervical back: Normal range of motion.  Skin:    General: Skin is warm and dry.  Neurological:     Mental Status: She is alert and oriented to person, place, and time.     Coordination: Coordination normal.    Vitals:   01/19/21 1100  BP: 116/78  Pulse: 82  Resp: 18  Temp: 98.3 F (36.8 C)  TempSrc: Oral  SpO2: 95%  Weight: 205 lb 12.8 oz (93.4 kg)  Height: '5\' 7"'$  (1.702 m)    This visit occurred during the SARS-CoV-2 public health emergency.  Safety protocols were in place, including screening questions prior to the visit, additional usage of staff PPE, and extensive cleaning of exam room while observing appropriate contact time as indicated for disinfecting solutions.   Assessment & Plan:

## 2021-01-19 NOTE — Patient Instructions (Addendum)
We have checked the sugars today and the HgA1c is 6.0 which is better but still in the pre-diabetes range.

## 2021-01-21 DIAGNOSIS — R7303 Prediabetes: Secondary | ICD-10-CM

## 2021-01-21 HISTORY — DX: Prediabetes: R73.03

## 2021-01-21 NOTE — Assessment & Plan Note (Signed)
She has been working with nutrition. POC Hga1c done today at 6.5. We will plan to check again in 6 months and she is encouraged to continue with the eating changes.

## 2021-01-24 ENCOUNTER — Ambulatory Visit: Payer: Medicare HMO

## 2021-01-24 ENCOUNTER — Other Ambulatory Visit: Payer: Self-pay

## 2021-01-24 DIAGNOSIS — M6281 Muscle weakness (generalized): Secondary | ICD-10-CM | POA: Diagnosis not present

## 2021-01-24 DIAGNOSIS — M25611 Stiffness of right shoulder, not elsewhere classified: Secondary | ICD-10-CM | POA: Diagnosis not present

## 2021-01-24 DIAGNOSIS — M25511 Pain in right shoulder: Secondary | ICD-10-CM

## 2021-01-24 DIAGNOSIS — R252 Cramp and spasm: Secondary | ICD-10-CM

## 2021-01-24 DIAGNOSIS — M25551 Pain in right hip: Secondary | ICD-10-CM

## 2021-01-24 DIAGNOSIS — M25552 Pain in left hip: Secondary | ICD-10-CM | POA: Diagnosis not present

## 2021-01-24 NOTE — Therapy (Signed)
[facility] [address], [place], [place], [address] Phone: [phone]   Fax:  [phone]  Physical Therapy Treatment  Patient Details  Name: Kelly [name] MRN: [id number] Date of Birth: 1941-03-24 Referring Provider (PT): [name], MD   Encounter Date: 01/24/2021   PT End of Session - 01/24/21 1650     Visit Number 16    Authorization Type [employer]  KX now    Authorization - Number of Visits 24    Progress Note Due on Visit 20    PT Start Time 1617    PT Stop Time 1652    PT Time Calculation (min) 35 min    Activity Tolerance Patient tolerated treatment well    Behavior During Therapy [facility] for tasks assessed/performed             Past Medical History:  Diagnosis Date   Allergy    Blood transfusion without reported diagnosis    Breast cancer ([place])    Breast mass    fibocystic breast dx   Cataract    Family history of colon cancer 04/15/2020   Family history of pancreatic cancer 04/15/2020   Family history of prostate cancer 04/15/2020   Hepatitis    CMV 1992   Hyperlipidemia    Postmenopausal HRT (hormone replacement therapy)    Scarlet fever as a teen   Sleep apnea     Past Surgical History:  Procedure Laterality Date   APPENDECTOMY     BREAST LUMPECTOMY WITH RADIOACTIVE SEED LOCALIZATION Right 04/27/2020   Procedure: RIGHT BREAST LUMPECTOMY WITH RADIOACTIVE SEED LOCALIZATION;  Surgeon: [name], MD;  Location: [facility];  Service: General;  Laterality: Right;   BUNIONECTOMY     caesarean section     COLONOSCOPY     CORRECTION HAMMER TOE     FOOT TENDON SURGERY     left    POLYPECTOMY     TONSILLECTOMY     TONSILLECTOMY      There were no vitals filed for this visit.   Subjective Assessment - 01/24/21 1621     Subjective I feel 70-80% better overall.  I'm still having Lt foot pain.    Pertinent History Breast cancer, HLD, hepatitis;  foot issues (flat arches); rod in  foot from bunionectomy; piriformis issues in the past    Currently in Pain? Yes    Pain Score 2     Pain Location Shoulder    Pain Orientation Right    Pain Descriptors / Indicators Sore;Tightness    Pain Type Chronic pain    Pain Onset 1 to 4 weeks ago    Pain Frequency Constant    Aggravating Factors  reaching behind back    Pain Relieving Factors rest, change of position    Pain Score 0    Pain Location Hip    Pain Orientation Right                [facility] PT Assessment - 01/24/21 0001       Assessment   Medical Diagnosis acute pain of right shoulder; rib pain on right side; fall    Referring Provider (PT) [name], MD    Onset Date/Surgical Date 11/06/20    Hand Dominance Right      Prior Function   Level of Independence Independent    Vocation Retired    Leisure play bridge; card games      Cognition   Overall Cognitive Status Within Functional Limits  for tasks assessed      Observation/Other Assessments   Focus on Therapeutic Outcomes (FOTO)  58 (goal is 64)      AROM   Right Shoulder Flexion 160 Degrees    Right Shoulder ABduction 155 Degrees    Right Shoulder Internal Rotation --   to T6     Strength   Right Hip ABduction 4+/5    Left Hip ABduction 4+/5                           [facility] Adult PT Treatment/Exercise - 01/24/21 0001       Lumbar Exercises: Aerobic   Nustep L2 x 10 minutes: PT present to discuss progress and monitor patient response      Knee/Hip Exercises: Seated   Sit to Sand 20 reps;without UE support   holding 5# kettle bell and pressing out upon standing                    PT Education - 01/24/21 1653     Education Details verbal review of all HEP with instructions on how to progress after D/C.  body mechanics and pacing education    Person(s) Educated Patient    Methods Explanation    Comprehension Verbalized understanding              PT Short Term Goals - 12/21/20 3875       PT SHORT  TERM GOAL #1   Title Patient will be independent with basic shoulder and LE HEP for continued progression at home.    Status Achieved      PT SHORT TERM GOAL #2   Title The patient will have improved right shoulder flexion/scaption to 120 degrees needed for reaching overhead    Status Achieved      PT SHORT TERM GOAL #3   Title The patient will be able to transfer supine to sit without assist    Status Achieved      PT SHORT TERM GOAL #4   Title The patient will be able to rise from a standard chair with min UE assist 3 out of 5x    Time 4    Period Weeks    Status On-going               PT Long Term Goals - 01/24/21 1623       PT LONG TERM GOAL #1   Title Patient will be independent with advanced HEP for long term management of symptoms post D/C    Status Achieved      PT LONG TERM GOAL #2   Title Patient will demonstrate 150 degrees of Rt shoulder flexion/ scaption needed for reaching to 2nd shelf    Baseline 160    Status Achieved      PT LONG TERM GOAL #3   Title Patient will score 64 on FOTO to indicate improved overall mobility.    Baseline 58    Status Partially Met      PT LONG TERM GOAL #4   Title The patient will be able to lift dinner plates to 2nd shelf when unloading the dishwasher    Baseline 3 plates    Status Achieved      PT LONG TERM GOAL #5   Title The patient will report a 50% improvement in standing tolerance for cooking and cleaning the kitchen.    Baseline 85% better- limited for long periods of time  Status Achieved      PT LONG TERM GOAL #6   Title The patient will have improved right shoulder internal rotation to T9 needed to hook bra in the back    Baseline T6    Status Achieved      PT LONG TERM GOAL #7   Title The patient will be able to rise from a standard chair without UE use 2 out of 3x    Baseline --    Status Achieved      PT LONG TERM GOAL #8   Title Pt will demonstrate 4/5  bilateral hip abduction strength to  decrease fall risk and improve form during sit to stand.    Baseline 4+/5    Status Achieved                   Plan - 01/24/21 1656     Clinical Impression Statement Pt is ready to D/C to HEP.  Pt reports 70-80% overall improvement in her shoulder and hip symptoms since the start of care.  Pt exercises regularly at [facility] and will continue with this and HEP for continued gains.  Session focused on final goal assessment, review of HEP with instructions on how to progress after D/C and body mechanics modifications for home tasks to reduce stresses with functional tasks.  Pt verbalized understanding.  Main issue now is Lt foot and pt will follow-up with MD regarding this issue.    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   Hepatic steatosis 09/10/2020   Genetic testing 04/26/2020   Family history of pancreatic cancer 04/15/2020   Family history of ovarian cancer 04/15/2020   Family history of prostate cancer 04/15/2020   Family history of colon cancer 04/15/2020   Malignant neoplasm of upper-outer quadrant of right breast in female, estrogen receptor positive ([place]) 04/05/2020   Degenerative disc disease, cervical 08/07/2019   Lumbar radiculopathy 04/11/2017   Bunion of great toe of right foot 04/18/2016   OSA on CPAP 12/23/2015   Urinary incontinence 03/31/2014   Fibrocystic breast disease 06/06/2011   Routine health maintenance  11/12/2010   Obesity 09/11/2007   Hyperlipidemia 06/10/2007    PHYSICAL THERAPY DISCHARGE SUMMARY  Visits from Start of Care: 16  Current functional level related to goals / functional outcomes: See above for current status and goals.     Remaining deficits: No significant deficits related to Rt shoulder pain.   Education / Equipment: HEP, posture/mechanics   Patient agrees to discharge. Patient goals were partially met. Patient is being discharged due to being pleased with the current functional level.  Sigurd Sos, PT 01/24/21 4:57 PM   [facility] [address], [place], [place], [address] Phone: [phone]   Fax:  [phone]  Name: Kelly [name] MRN: [id number] Date of Birth: 10/27/1940

## 2021-02-07 DIAGNOSIS — G4733 Obstructive sleep apnea (adult) (pediatric): Secondary | ICD-10-CM | POA: Diagnosis not present

## 2021-02-07 DIAGNOSIS — E119 Type 2 diabetes mellitus without complications: Secondary | ICD-10-CM | POA: Diagnosis not present

## 2021-02-09 ENCOUNTER — Ambulatory Visit: Payer: Medicare HMO | Admitting: Family Medicine

## 2021-02-09 ENCOUNTER — Other Ambulatory Visit: Payer: Self-pay

## 2021-02-09 VITALS — BP 130/84 | HR 73 | Ht 67.0 in | Wt 206.0 lb

## 2021-02-09 DIAGNOSIS — M79672 Pain in left foot: Secondary | ICD-10-CM

## 2021-02-09 DIAGNOSIS — G8929 Other chronic pain: Secondary | ICD-10-CM

## 2021-02-09 DIAGNOSIS — M25512 Pain in left shoulder: Secondary | ICD-10-CM | POA: Diagnosis not present

## 2021-02-09 NOTE — Patient Instructions (Signed)
Thank you for coming in today.   You should hear from MRI scheduling within 1 week. If you do not hear please let me know.    Recheck after the MRI.

## 2021-02-09 NOTE — Progress Notes (Signed)
I, Peterson Lombard, LAT, ATC acting as a scribe for [name], MD.  Kelly [name] is a 80 y.o. female who presents to [place] at [facility] today for f/u of L lateral and plantar foot pain.  She had an XR at the end of her visit that revealed a L 5th toe PIP dislocation and a possible nondisplaced fx of her 5th proximal phalangeal head. Pt was last seen by Dr. Georgina Snell on 01/10/21 and was advised for watchful waiting. Today, pt reports L foot is still painful, esp when trying to walk across the arch on both the medial and lateral aspects. R foot/ankle swelling present and pt c/o not feeling "stable" on her feet. Pt also c/o soreness long the medial border of her L scapula and at the inferior apex.   Dx imaging: 01/05/21 L foot XR  Pertinent review of systems: No fevers or chills   Relevant historical information: History of breast cancer   Exam:  BP 130/84   Pulse 73   Ht 5\' 7"  (1.702 m)   Wt 206 lb (93.4 kg)   SpO2 98%   BMI 32.26 kg/m  General: Well Developed, well nourished, and in no acute distress.   MSK: Left foot.  Pes planus and ankle pronation present. Mild swelling dorsal and plantar midfoot. Tender palpation along plantar midfoot. Not particularly tender to palpation at fifth MTP. Normal foot and ankle motion. Strength is intact.  Left shoulder normal. Normal shoulder motion. Tender palpation medial and inferior periscapular musculature including rhomboid. Shoulder strength is intact.    Lab and Radiology Results   EXAM: LEFT FOOT - COMPLETE 3+ VIEW   COMPARISON:  None.   FINDINGS: The fifth proximal interphalangeal joint is dislocated with dorsomedial displacement of the middle phalanx. There may be an associated fracture of the head of fifth proximal phalanx. No additional evidence of acute fracture. Postoperative changes involving the first metatarsal. Moderate first metatarsophalangeal joint osteoarthritis. Calcaneal spur.    IMPRESSION: 1. Dislocated fifth proximal interphalangeal joint. Possible nondisplaced fracture of fifth proximal phalangeal head. 2. First metatarsophalangeal joint osteoarthritis.     Electronically Signed   By: Lorin Picket M.D.   On: 01/06/2021 10:21   I, [name], personally (independently) visualized and performed the interpretation of the images attached in this note.   Assessment and Plan: 80 y.o. female with left foot pain.  Patient does have fracture/dislocation of the fifth toe at MTP however she is not painful in this region and I do not think it is clinically significant source of her current pain.  We discussed treatment on visit S August 29.  Please see that note for further details.  Fundamentally patient does not wish to explore dedicated treatment for this including surgery or aggressive attempt at reduction.  As for the midfoot pain etiology remains a bit unclear.  She is had trials of conservative management so far with little benefit.  Pain occurred after a fall which could indicate an occult fracture.  X-ray in this region is unremarkable.  Plan for MRI to further characterize source of pain and dictate future treatment.  Recheck after MRI foot.  Left shoulder pain periscapular pain.  Plan to continue home exercise program and massage therapy.  Also recommend heating pad.   PDMP not reviewed this encounter. Orders Placed This Encounter  Procedures   MR FOOT LEFT WO CONTRAST    Eval midfoot and arch pain. Patient does not have much pain at 5th toe fx  Standing Status:   Future    Standing Expiration Date:   02/09/2022    Order Specific Question:   What is the patient's sedation requirement?    Answer:   No Sedation    Order Specific Question:   Does the patient have a pacemaker or implanted devices?    Answer:   No    Order Specific Question:   Preferred imaging location?    Answer:   Product/process development scientist (table limit-350lbs)   No orders of the defined  types were placed in this encounter.    Discussed warning signs or symptoms. Please see discharge instructions. Patient expresses understanding.   The above documentation has been reviewed and is accurate and complete [name], M.D.

## 2021-02-10 ENCOUNTER — Other Ambulatory Visit: Payer: Self-pay

## 2021-02-10 ENCOUNTER — Inpatient Hospital Stay: Payer: Medicare HMO | Admitting: Dietician

## 2021-02-10 NOTE — Progress Notes (Signed)
Nutrition  Patient attended Nutrition 101 class 02/10/21.

## 2021-02-14 ENCOUNTER — Encounter: Payer: Self-pay | Admitting: Family Medicine

## 2021-02-15 ENCOUNTER — Other Ambulatory Visit: Payer: Self-pay | Admitting: Adult Health

## 2021-02-15 DIAGNOSIS — C50411 Malignant neoplasm of upper-outer quadrant of right female breast: Secondary | ICD-10-CM

## 2021-02-18 ENCOUNTER — Encounter: Payer: Self-pay | Admitting: Family Medicine

## 2021-02-19 DIAGNOSIS — W19XXXA Unspecified fall, initial encounter: Secondary | ICD-10-CM | POA: Diagnosis not present

## 2021-02-19 DIAGNOSIS — M62830 Muscle spasm of back: Secondary | ICD-10-CM | POA: Diagnosis not present

## 2021-02-19 DIAGNOSIS — Z7182 Exercise counseling: Secondary | ICD-10-CM | POA: Diagnosis not present

## 2021-02-19 DIAGNOSIS — M25572 Pain in left ankle and joints of left foot: Secondary | ICD-10-CM | POA: Diagnosis not present

## 2021-02-19 DIAGNOSIS — S8002XA Contusion of left knee, initial encounter: Secondary | ICD-10-CM | POA: Diagnosis not present

## 2021-02-22 NOTE — Progress Notes (Signed)
I, [name], LAT, ATC, am serving as scribe for Dr. [name].  Kelly [name] is a 80 y.o. female who presents to [facility] at [facility] today for L knee pain.  She was last seen by Dr. Georgina Snell on 02/09/21 for f/u of L lateral and plantar foot pain due to a L 5th toe PIP dislocation and a possible nondisplaced fx of her 5th proximal phalangeal head per X-ray.  Today, pt reports L knee pain after suffering a fall on 02/17/21 while visiting her daughter in [place] when she fell down some steps and landed on her L knee on the hardwood floor.  She was [place] at an [place] on 02/19/21 and had L knee X-rays.  She was provided a knee brace and an ankle brace and prescribed methocarbamol.  Today, pt locates pain to the anterior aspect of her L knee, w/ swelling, and ecchymosis present. Pt locates L ankle pain to the lateral aspect. Pt has a L foot MRI scheduled for Saturday, 10/15.  Diagnostic testing: L knee XR from UC in [facility] [place]- 02/19/21 Urgent Care was total access urgent care on [address] in [place]  Pertinent review of systems: No fevers or chills  Relevant historical information: History of breast cancer   Exam:  BP 128/84   Pulse 77   Ht 5\' 7"  (1.702 m)   Wt 208 lb 6.4 oz (94.5 kg)   SpO2 96%   BMI 32.64 kg/m  General: Well Developed, well nourished, and in no acute distress.   MSK: Left hip normal-appearing normal motion.  Mildly tender palpation lateral hip.  Left knee bruising and swelling anterior knee.  Tender palpation overlying patella and patellar tendon. Normal knee motion and strength. Stable ligamentous exam.  Left ankle normal-appearing Normal ankle motion. Tender palpation at ATFL region and along course of peroneal tendons posterior to the lateral malleolus. Strength is intact. Stable ligamentous exam.    Lab and Radiology Results  Diagnostic Limited MSK Ultrasound of: Left knee anterior knee Hypoechoic fluid tracks  superficial to patella and patellar tendon anterior knee consistent with mild prepatellar bursitis. Impression: Patellar bursitis   X-ray images left knee and left ankle obtained at urgent care in [place] last week personally and independently interpreted today.  Images provided on CD by patient.  Left knee: No acute fractures.  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  Mild patellofemoral DJD.  Left ankle: Moderate ankle DJD.  No acute fractures.    Assessment and Plan: 80 y.o. female with fall occurring 6 days ago. Main issue today is left anterior knee pain thought to be due to prepatellar bursitis and contusion.  X-ray at time of urgent care encounter last week did not show fracture.  Ultrasound today shows prepatellar bursitis.  We will plan to treat with compression and Voltaren gel.    Left ankle pain: Thought to be due to ankle sprain and possible peroneal tendinopathy.  Treat with Voltaren gel. Patient has a previously scheduled left foot MRI this weekend and will follow up in clinic in 1 week to review the MRI and discuss how she is feeling with her knee and ankle   PDMP not reviewed this encounter. Orders Placed This Encounter  Procedures   [place] LIMITED JOINT SPACE STRUCTURES LOW LEFT(NO LINKED CHARGES)    Standing Status:   Future    Number of Occurrences:   1    Standing Expiration Date:   08/24/2021  Order Specific Question:   Reason for Exam (SYMPTOM  OR DIAGNOSIS REQUIRED)    Answer:   left knee pain    Order Specific Question:   Preferred imaging location?    Answer:   [place]   No orders of the defined types were placed in this encounter.    Discussed warning signs or symptoms. Please see discharge instructions. Patient expresses understanding.   The above documentation has been reviewed and is accurate and complete [name], M.D.

## 2021-02-23 ENCOUNTER — Other Ambulatory Visit: Payer: Self-pay

## 2021-02-23 ENCOUNTER — Ambulatory Visit: Payer: Self-pay

## 2021-02-23 ENCOUNTER — Ambulatory Visit: Payer: Medicare HMO | Admitting: Family Medicine

## 2021-02-23 VITALS — BP 128/84 | HR 77 | Ht 67.0 in | Wt 208.4 lb

## 2021-02-23 DIAGNOSIS — M7042 Prepatellar bursitis, left knee: Secondary | ICD-10-CM | POA: Diagnosis not present

## 2021-02-23 DIAGNOSIS — M25552 Pain in left hip: Secondary | ICD-10-CM

## 2021-02-23 DIAGNOSIS — S93492A Sprain of other ligament of left ankle, initial encounter: Secondary | ICD-10-CM

## 2021-02-23 DIAGNOSIS — M25562 Pain in left knee: Secondary | ICD-10-CM

## 2021-02-23 DIAGNOSIS — W19XXXA Unspecified fall, initial encounter: Secondary | ICD-10-CM

## 2021-02-23 NOTE — Patient Instructions (Addendum)
Thank you for coming in today.   Recheck in about 1 week.   We can review the MRI then and see how the knee is.   Please use Voltaren gel (Generic Diclofenac Gel) up to 4x daily for pain as needed.  This is available over-the-counter as both the name brand Voltaren gel and the generic diclofenac gel.   I recommend you obtained a compression sleeve to help with your joint problems. There are many options on the market however I recommend obtaining a full knee Body Helix compression sleeve.  You can find information (including how to appropriate measure yourself for sizing) can be found at www.Body http://www.lambert.com/.  Many of these products are health savings account (HSA) eligible.   You can use the compression sleeve at any time throughout the day but is most important to use while being active as well as for 2 hours post-activity.   It is appropriate to ice following activity with the compression sleeve in place.   Prepatellar Bursitis Prepatellar bursitis is inflammation of the prepatellar bursa, which is a fluid-filled sac that cushions the kneecap (patella). Prepatellar bursitis happens when fluid builds up in this sac and causes it to swell. The condition causes knee pain. What are the causes? This condition may be caused by: Constant pressure on the knees from kneeling. A hit to the knee. Falling on the knee. Infection from bacteria. Moving the knee often in a forceful way. What increases the risk? You are more likely to develop this condition if: You play sports that have a high risk of falling on the knee or being hit on the knee. These include football, wrestling, basketball, or soccer. You do work in which you kneel for long periods of time, such as roofing, plumbing, or gardening. You have another inflammatory condition, such as gout or rheumatoid arthritis. What are the signs or symptoms? The most common symptom of this condition is knee pain that gets better with rest. Other symptoms  include: Swelling on the front of the kneecap. Warmth in the knee. Tenderness with activity. Redness in the knee. Inability to bend the knee or to kneel. How is this diagnosed? This condition is diagnosed based on: A physical exam. Your health care provider will compare your knees and check for tenderness and pain while moving your knee. Your medical history. Tests to check for infection. These may include blood tests and tests on the fluid in the bursa. Imaging tests, such as X-ray, MRI, or ultrasound, to check for damage in the patella, or fluid buildup and swelling in the bursa. How is this treated? This condition may be treated by: Resting the knee. Putting ice on the knee. Taking medicines, such as: NSAIDs. These can help to reduce pain and swelling. Antibiotics. These may be needed if you have an infection. Steroids. These are used to reduce swelling and inflammation, and may be prescribed if other treatments are not helping. Raising (elevating) the knee while resting. Doing exercises to help you maintain movement (physical therapy). These may be recommended after pain and swelling improve. Having a procedure to remove fluid from the bursa. This may be done if other treatments are not helping. Having surgery to remove the bursa. This may be done if you have a severe infection or if the condition keeps coming back after treatment. Follow these instructions at home: Medicines Take over-the-counter and prescription medicines only as told by your health care provider. If you were prescribed an antibiotic medicine, take it as told  by your health care provider. Do not stop taking the antibiotic even if you start to feel better. Managing pain, stiffness, and swelling  If directed, put ice on the injured area. Put ice in a plastic bag. Place a towel between your skin and the bag. Leave the ice on for 20 minutes, 2-3 times a day. Elevate the injured area above the level of your heart  while you are sitting or lying down. Activity Do not use the injured limb to support your body weight until your health care provider says that you can. Rest your knee. Avoid activities that cause knee pain. Return to your normal activities as told by your health care provider. Ask your health care provider what activities are safe for you. Do exercises as told by your health care provider. General instructions Ask your health care provider when it is safe for you to drive. Do not use any products that contain nicotine or tobacco, such as cigarettes, e-cigarettes, and chewing tobacco. These can delay healing. If you need help quitting, ask your health care provider. Keep all follow-up visits as told by your health care provider. This is important. How is this prevented? Warm up and stretch before being active. Cool down and stretch after being active. Give your body time to rest between periods of activity. Maintain physical fitness, including strength and flexibility. Be safe and responsible while being active. This will help you to avoid falls. Wear knee pads if you have to kneel for a long period of time. Contact a health care provider if: Your symptoms do not improve or get worse. Your symptoms keep coming back after treatment. You develop a fever and have warmth, redness, or swelling over your knee. Summary Prepatellar bursitis is inflammation of the prepatellar bursa, which is a fluid-filled sac that cushions the kneecap (patella). This condition may be caused by injury or constant pressure on the knee. It may also be caused by an infection from bacteria. Symptoms of this condition include pain, swelling, warmth, and tenderness in the knee. Follow instructions from your health care provider about taking medicines, resting, and doing activities. Contact your health care provider if your symptoms do not improve, get worse, or keep coming back after treatment. This information is not  intended to replace advice given to you by your health care provider. Make sure you discuss any questions you have with your health care provider. Document Revised: 08/23/2018 Document Reviewed: 07/11/2018 Elsevier Patient Education  [place].

## 2021-02-26 ENCOUNTER — Other Ambulatory Visit: Payer: Medicare HMO

## 2021-02-27 ENCOUNTER — Telehealth: Payer: Medicare HMO | Admitting: Nurse Practitioner

## 2021-02-27 ENCOUNTER — Encounter: Payer: Self-pay | Admitting: Nurse Practitioner

## 2021-02-27 ENCOUNTER — Other Ambulatory Visit: Payer: Medicare HMO

## 2021-02-27 DIAGNOSIS — U071 COVID-19: Secondary | ICD-10-CM | POA: Diagnosis not present

## 2021-02-27 MED ORDER — NIRMATRELVIR/RITONAVIR (PAXLOVID)TABLET: 3.0000 | ORAL_TABLET | Freq: Two times a day (BID) | ORAL | 0 refills | Status: AC

## 2021-02-27 NOTE — Progress Notes (Signed)
Virtual Visit Consent   Kelly [name], you are scheduled for a virtual visit with a [facility] provider today.     Just as with appointments in the office, your consent must be obtained to participate.  Your consent will be active for this visit and any virtual visit you may have with one of our providers in the next 365 days.     If you have a MyChart account, a copy of this consent can be sent to you electronically.  All virtual visits are billed to your insurance company just like a traditional visit in the office.    As this is a virtual visit, video technology does not allow for your provider to perform a traditional examination.  This may limit your provider's ability to fully assess your condition.  If your provider identifies any concerns that need to be evaluated in person or the need to arrange testing (such as labs, EKG, etc.), we will make arrangements to do so.     Although advances in technology are sophisticated, we cannot ensure that it will always work on either your end or our end.  If the connection with a video visit is poor, the visit may have to be switched to a telephone visit.  With either a video or telephone visit, we are not always able to ensure that we have a secure connection.     I need to obtain your verbal consent now.   Are you willing to proceed with your visit today?    Kelly [name] has provided verbal consent on 02/27/2021 for a virtual visit (video or telephone).   Gildardo Pounds, NP   Date: 02/27/2021 1:06 PM   Virtual Visit via Video Note   I, Gildardo Pounds, connected with  Kelly [name]  ([id number], 12-09-1940) on 02/27/21 at  1:15 PM EDT by a video-enabled telemedicine application and verified that I am speaking with the correct person using two identifiers.  Location: Patient: Virtual Visit Location Patient: Home Provider: Virtual Visit Location Provider: Home Office   I discussed the limitations of evaluation and management by  telemedicine and the availability of in person appointments. The patient expressed understanding and agreed to proceed.    History of Present Illness: Kelly [name] is a 80 y.o. who identifies as a female who was assigned female at birth, and is being seen today for COVID POSITIVE test and symptoms.  HPI:  She went to [place] last week and returned 5 days ago. Yesterday she begain to feel weak and fatigued along with experiencing fever 101.2,  nasal congestion, cough, rhinorrhea. OTC antihistamine has provided minimal relief. COVID positive via home test x 2.    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 (Take nirmatrelvir 150 mg two tablets twice daily for 5 days and ritonavir 100 mg one tablet twice daily for 5 days) Patient GFR is 56, Disp: 30 tablet, Rfl: 0   anastrozole (ARIMIDEX) 1 MG tablet, Take 1 tablet (1 mg total) by mouth daily., Disp: 90 tablet, Rfl: 4    atorvastatin (LIPITOR) 40 MG tablet, TAKE 1 TABLET BY MOUTH EVERY DAY, Disp: 90 tablet, Rfl: 2   Calcium Citrate (CITRACAL PO), Take 2 tablets by mouth daily., Disp: , Rfl:    cholecalciferol (VITAMIN D3) 25 MCG (1000 UNIT) tablet, Take 1,000 Units by mouth daily., Disp: , Rfl:    Coenzyme Q10 (COQ10) 200 MG CAPS, Take 200 mg by mouth daily., Disp: , Rfl:    methocarbamol (ROBAXIN) 500 MG tablet, Take 500 mg by mouth 4 (four) times daily., Disp: , Rfl:    minoxidil (LONITEN) 2.5 MG tablet, TAKE 0.5 TABLETS BY MOUTH DAILY., Disp: 30 tablet, Rfl: 0   Multiple Vitamins-Minerals (SPECTRAVITE) TABS, Take 1 tablet by mouth daily., Disp: , Rfl:    Omega-3 Fatty Acids (FISH OIL) 1200 MG CAPS, Take 1,200 mg by mouth daily., Disp: , Rfl:   Observations/Objective: Patient is well-developed, well-nourished in no acute distress.  Resting comfortably  at home.  Head is normocephalic, atraumatic.  No labored breathing.  Speech is clear and coherent with logical content.  Patient is alert and oriented at baseline.    Assessment and Plan: 1. COVID-19 - nirmatrelvir/ritonavir EUA (PAXLOVID) 20 x 150 MG & 10 x 100MG  TABS; Take 3 tablets by mouth 2 (two) times daily for 5 days. (Take nirmatrelvir 150 mg two tablets twice daily for 5 days and ritonavir 100 mg one tablet twice daily for 5 days) Patient GFR is 56  Dispense: 30 tablet; Refill: 0 INSTRUCTIONS: use a humidifier for nasal congestion Drink plenty of fluids, rest and wash hands frequently to avoid the spread of infection Alternate tylenol and Motrin for relief of fever    Follow Up Instructions: I discussed the assessment and treatment plan with the patient. The patient was provided an opportunity to ask questions and all were answered. The patient agreed with the plan and demonstrated an understanding of the instructions.  A copy of instructions were sent to the patient via MyChart unless otherwise noted below.     The patient was advised to call  back or seek an in-person evaluation if the symptoms worsen or if the condition fails to improve as anticipated.  Time:  I spent 10 minutes with the patient via telehealth technology discussing the above problems/concerns and reviewing medications and past medical history.    Gildardo Pounds, NP

## 2021-02-27 NOTE — Patient Instructions (Signed)
E-Visit  for Positive Covid Test Result  We are sorry you are not feeling well. We are here to help!  You have tested positive for COVID-19, meaning that you were infected with the novel coronavirus and could give the virus to others.  It is vitally important that you stay home so you do not spread it to others.      Please continue isolation at home, for at least 10 days since the start of your symptoms and until you have had 24 hours with no fever (without taking a fever reducer) and with improving of symptoms.  If you have no symptoms but tested positive (or all symptoms resolve after 5 days and you have no fever) you can leave your house but continue to wear a mask around others for an additional 5 days. If you have a fever,continue to stay home until you have had 24 hours of no fever. Most cases improve 5-10 days from onset but we have seen a small number of patients who have gotten worse after the 10 days.  Please be sure to watch for worsening symptoms and remain taking the proper precautions.   Go to the nearest hospital ED for assessment if fever/cough/breathlessness are severe or illness seems like a threat to life.    The following symptoms may appear 2-14 days after exposure: Fever Cough Shortness of breath or difficulty breathing Chills Repeated shaking with chills Muscle pain Headache Sore throat New loss of taste or smell Fatigue Congestion or runny nose Nausea or vomiting Diarrhea  You have been enrolled in [facility] for COVID-19. Daily you will receive a questionnaire within the [facility] website. Our COVID-19 response team will be monitoring your responses daily.  I have prescribed PAXLOVID and sent to the pharmacy you requested.   You may also take acetaminophen (Tylenol) as needed for fever.  HOME CARE: Only take medications as instructed by your medical team. Drink plenty of fluids and get plenty of rest. A steam or ultrasonic humidifier can  help if you have congestion.   GET HELP RIGHT AWAY IF YOU HAVE EMERGENCY WARNING SIGNS.  Call 911 or proceed to your closest emergency facility if: You develop worsening high fever. Trouble breathing Bluish lips or face Persistent pain or pressure in the chest New confusion Inability to wake or stay awake You cough up blood. Your symptoms become more severe Inability to hold down food or fluids  This list is not all possible symptoms. Contact your medical provider for any symptoms that are severe or concerning to you.    Your e-visit answers were reviewed by a board certified advanced clinical practitioner to complete your personal care plan.  Depending on the condition, your plan could have included both over the counter or prescription medications.  If there is a problem please reply once you have received a response from your provider.  Your safety is important to [place].  If you have drug allergies check your prescription carefully.    You can use MyChart to ask questions about today's visit, request a non-urgent call back, or ask for a work or school excuse for 24 hours related to this e-Visit. If it has been greater than 24 hours you will need to follow up with your provider, or enter a new e-Visit to address those concerns. You will get an e-mail in the next two days asking about your experience.  I hope that your e-visit has been valuable and will speed your recovery.  Thank you for using e-visits.

## 2021-02-28 ENCOUNTER — Other Ambulatory Visit: Payer: Self-pay | Admitting: Oncology

## 2021-02-28 ENCOUNTER — Telehealth: Payer: Self-pay | Admitting: Internal Medicine

## 2021-02-28 NOTE — Progress Notes (Unsigned)
Ms. Kelly [name] tested positive for COVID 02/27/2021.  She was started on pack Slo-Bid.  This should not interfere with her continuing on anastrozole.

## 2021-02-28 NOTE — Telephone Encounter (Signed)
Pt had a virtual with Dr. Raul Del on 02/27/2021 an was prescribed Paxolvid. Dr. Sharlet Salina is aware.

## 2021-02-28 NOTE — Telephone Encounter (Signed)
[facility] FYI 02/26/2021  Caller states tested positive for COVID today. Symptoms sore throat, body aches, fatigue, runny nose, cough. 101.2 oral. Symptoms started Friday.  Advised to be seen within 4 hrs. Caller understood and had a virtual visit.

## 2021-03-01 ENCOUNTER — Encounter: Payer: Self-pay | Admitting: Internal Medicine

## 2021-03-02 ENCOUNTER — Ambulatory Visit: Payer: Medicare HMO | Admitting: Family Medicine

## 2021-03-05 ENCOUNTER — Ambulatory Visit (INDEPENDENT_AMBULATORY_CARE_PROVIDER_SITE_OTHER): Payer: Medicare HMO

## 2021-03-05 ENCOUNTER — Other Ambulatory Visit: Payer: Self-pay

## 2021-03-05 DIAGNOSIS — M79672 Pain in left foot: Secondary | ICD-10-CM

## 2021-03-05 DIAGNOSIS — G8929 Other chronic pain: Secondary | ICD-10-CM | POA: Diagnosis not present

## 2021-03-05 DIAGNOSIS — M19072 Primary osteoarthritis, left ankle and foot: Secondary | ICD-10-CM | POA: Diagnosis not present

## 2021-03-07 DIAGNOSIS — H35033 Hypertensive retinopathy, bilateral: Secondary | ICD-10-CM | POA: Diagnosis not present

## 2021-03-07 DIAGNOSIS — H25013 Cortical age-related cataract, bilateral: Secondary | ICD-10-CM | POA: Diagnosis not present

## 2021-03-07 DIAGNOSIS — H2513 Age-related nuclear cataract, bilateral: Secondary | ICD-10-CM | POA: Diagnosis not present

## 2021-03-07 DIAGNOSIS — H40023 Open angle with borderline findings, high risk, bilateral: Secondary | ICD-10-CM | POA: Diagnosis not present

## 2021-03-07 DIAGNOSIS — H524 Presbyopia: Secondary | ICD-10-CM | POA: Diagnosis not present

## 2021-03-07 NOTE — Progress Notes (Signed)
MRI does show some irritation potential impingement in the ankle that could cause some of your pain.  We will discuss this further during your visit tomorrow.

## 2021-03-07 NOTE — Progress Notes (Signed)
I, Kelly [name], LAT, ATC, am serving as scribe for Dr. [name].  Kelly [name] is a 80 y.o. female who presents to [place] [facility] at [facility] today for f/u of L knee pain, L ankle pain and L foot MRI review after suffering a fall on 02/17/21 while visiting her daughter in [place] when she fell down some steps and landed on her L knee on the hardwood floor.  She was last seen by Dr. Georgina Snell on 02/23/21 and was advised to treat her L knee w/ compression and Voltaren gel.  Previously, she had been seen on 02/09/21 for L platnar foot pain that was later found to be due to L 5th toe PIP dislocation and a possible nondisplaced fx of her 5th proximal phalangeal head per XR. Today, pt reports that her L knee is feeling better.  She has been using the Voltaren gel and a knee compression sleeve.  Her L foot pain is improved but she con't to have L lateral and medial ankle pain as well as post ankle pain.  Her L ankle is swollen today.  Diagnostic testing: L foot MRI- 03/05/21; L knee XR from [facility] in [place]- 02/19/21 Urgent Care was [facility] access urgent care on [address] in [place]; L foot XR- 01/05/21  Pertinent review of systems: no fever or chills  Relevant historical information: history of breast cancer   Exam:  BP 102/62 (BP Location: Left Arm, Patient Position: Sitting, Cuff Size: Normal)   Pulse 77   Ht 5\' 7"  (1.702 m)   Wt 202 lb 6.4 oz (91.8 kg)   SpO2 94%   BMI 31.70 kg/m  General: Well Developed, well nourished, and in no acute distress.   MSK: left foot and ankle Swelling poster lateral ankle mostly inferior to lateral malleolus.  Tender palpation in this region. Decreased ankle motion. Forefoot and midfoot nontender today. Pulses capillary fill and sensation are intact distally    Lab and Radiology Results No results found for this or any previous visit (from the past 72 hour(s)). MR FOOT LEFT WO CONTRAST  Result Date: 03/07/2021 CLINICAL DATA:   Foot pain.  Pain for 2 months. EXAM: MRI OF THE LEFT FOOT WITHOUT CONTRAST TECHNIQUE: Multiplanar, multisequence MR imaging of the left foot was performed. No intravenous contrast was administered. COMPARISON:  None. FINDINGS: Bones/Joint/Cartilage No acute fracture or dislocation. Prior healed osteotomy of the first metatarsal. Mild osteoarthritis of the first MTP joint. Normal alignment. No joint effusion. Mild osteoarthritis of the calcaneocuboid joint. Subcortical extra-articular reactive marrow changes involving the lateral talus and calcaneus as can be seen with posterolateral ankle impingement. Ligaments Collateral ligaments are intact.  Lisfranc ligament is intact. Muscles and Tendons Flexor, peroneal and extensor compartment tendons are intact. Muscles are normal. Soft tissue No fluid collection or hematoma.  No soft tissue mass. IMPRESSION: 1.  No acute osseous injury of the left foot. 2. Prior healed osteotomy of the first metatarsal. Mild osteoarthritis of the first MTP joint. 3. Subcortical extra-articular reactive marrow changes involving the lateral talus and calcaneus as can be seen with posterolateral ankle impingement. Electronically Signed   By: Kathreen Devoid M.D.   On: 03/07/2021 10:32   I, [name], personally (independently) visualized and performed the interpretation of the images attached in this note.     Assessment and Plan: 80 y.o. female with left poster lateral foot and ankle pain. The pain was more located in the forefoot and arch when the MRI  of the foot was ordered but recently the pain has been more located in the posterior lateral ankle and lateral foot.  This corresponds to the area seen on MRI of the foot.  Unfortunately the rest of the posterior foot and lateral foot and ankle are not well visualized with the foot MRI.  After discussion with patient plan to proceed with ankle MRI to further characterize source of pain including probable posterior lateral impingement.   This should help determine future treatment plans including possible injection or even surgery. Check after MRI.  PDMP not reviewed this encounter. Orders Placed This Encounter  Procedures   MR ANKLE LEFT WO CONTRAST    Standing Status:   Future    Standing Expiration Date:   03/08/2022    Order Specific Question:   What is the patient's sedation requirement?    Answer:   No Sedation    Order Specific Question:   Does the patient have a pacemaker or implanted devices?    Answer:   No    Order Specific Question:   Preferred imaging location?    Answer:   Product/process development scientist (table limit-350lbs)   No orders of the defined types were placed in this encounter.    Discussed warning signs or symptoms. Please see discharge instructions. Patient expresses understanding.   The above documentation has been reviewed and is accurate and complete [name], M.D.

## 2021-03-08 ENCOUNTER — Other Ambulatory Visit: Payer: Self-pay

## 2021-03-08 ENCOUNTER — Ambulatory Visit: Payer: Medicare HMO | Admitting: Family Medicine

## 2021-03-08 ENCOUNTER — Encounter: Payer: Self-pay | Admitting: Family Medicine

## 2021-03-08 VITALS — BP 102/62 | HR 77 | Ht 67.0 in | Wt 202.4 lb

## 2021-03-08 DIAGNOSIS — M25572 Pain in left ankle and joints of left foot: Secondary | ICD-10-CM | POA: Diagnosis not present

## 2021-03-08 DIAGNOSIS — G8929 Other chronic pain: Secondary | ICD-10-CM

## 2021-03-08 NOTE — Patient Instructions (Addendum)
Good to see you today.  Follow-up:after the MRI.

## 2021-03-10 ENCOUNTER — Inpatient Hospital Stay: Payer: Medicare HMO | Admitting: Dietician

## 2021-03-10 DIAGNOSIS — E119 Type 2 diabetes mellitus without complications: Secondary | ICD-10-CM | POA: Diagnosis not present

## 2021-03-16 ENCOUNTER — Telehealth: Payer: Self-pay | Admitting: Family Medicine

## 2021-03-16 ENCOUNTER — Telehealth: Payer: Self-pay | Admitting: *Deleted

## 2021-03-16 NOTE — Telephone Encounter (Signed)
This RN spoke with per her call regarding expected schedule for mammo, bone scan and follow up with this office.  Noted mammo and bone scan ordered by [facility]/NP at visit in Sept 2022 with expected date of October 2022.  Follow up in this office is with Dr Chryl Heck in Nov 2023.  Reviewed above - informed pt this RN would [facility] need to schedule scans.  Informed pt that of next appt in Nov of 2023- as well as this RN's name for contact if she has questions or needs before next scheduled appt.  Pt stated appreciation.  This RN contacted the [facility] - verified orders are viewable and they will call pt now to schedule.

## 2021-03-16 NOTE — Telephone Encounter (Signed)
Peer-to-peer completed today for ankle MRI. Ankle MRI approved. Authorization number : [id number] Exp May 2023

## 2021-03-20 ENCOUNTER — Other Ambulatory Visit: Payer: Self-pay

## 2021-03-20 ENCOUNTER — Ambulatory Visit (INDEPENDENT_AMBULATORY_CARE_PROVIDER_SITE_OTHER): Payer: Medicare HMO

## 2021-03-20 ENCOUNTER — Other Ambulatory Visit: Payer: Medicare HMO

## 2021-03-20 DIAGNOSIS — M25572 Pain in left ankle and joints of left foot: Secondary | ICD-10-CM | POA: Diagnosis not present

## 2021-03-20 DIAGNOSIS — Q666 Other congenital valgus deformities of feet: Secondary | ICD-10-CM | POA: Diagnosis not present

## 2021-03-20 DIAGNOSIS — G8929 Other chronic pain: Secondary | ICD-10-CM

## 2021-03-20 DIAGNOSIS — M65872 Other synovitis and tenosynovitis, left ankle and foot: Secondary | ICD-10-CM | POA: Diagnosis not present

## 2021-03-20 DIAGNOSIS — S86312A Strain of muscle(s) and tendon(s) of peroneal muscle group at lower leg level, left leg, initial encounter: Secondary | ICD-10-CM | POA: Diagnosis not present

## 2021-03-22 ENCOUNTER — Encounter: Payer: Self-pay | Admitting: Family Medicine

## 2021-03-22 NOTE — Progress Notes (Signed)
The ankle MRI is a lot more helpful.   You have a partial tear of one of the tendons going down the outside part of the ankle and a partial tear of one of the tendons: The inside part of the ankle and some evidence of impingement in the back of the ankle and you have some cartilage injury in the ankle joint.  All of this together could obviously cause some pain. Recommend that you return to clinic to go over the results of full detail and discuss treatment plan and options.

## 2021-03-23 DIAGNOSIS — H2513 Age-related nuclear cataract, bilateral: Secondary | ICD-10-CM | POA: Diagnosis not present

## 2021-03-23 DIAGNOSIS — H25013 Cortical age-related cataract, bilateral: Secondary | ICD-10-CM | POA: Diagnosis not present

## 2021-03-24 ENCOUNTER — Ambulatory Visit: Payer: Medicare HMO | Admitting: Family Medicine

## 2021-03-24 ENCOUNTER — Other Ambulatory Visit: Payer: Self-pay

## 2021-03-24 VITALS — BP 108/76 | HR 96 | Ht 67.0 in | Wt 200.8 lb

## 2021-03-24 DIAGNOSIS — G8929 Other chronic pain: Secondary | ICD-10-CM

## 2021-03-24 DIAGNOSIS — M79672 Pain in left foot: Secondary | ICD-10-CM | POA: Diagnosis not present

## 2021-03-24 DIAGNOSIS — M25572 Pain in left ankle and joints of left foot: Secondary | ICD-10-CM

## 2021-03-24 NOTE — Patient Instructions (Addendum)
Thank you for coming in today.   Try wearing the scaphoid pad  Check back in 1 month

## 2021-03-24 NOTE — Progress Notes (Signed)
I, Peterson Lombard, LAT, ATC acting as a scribe for [name], MD.  Kelly [name] is a 80 y.o. female who presents to [place] at [facility] today for f/u chronic L ankle pain and L ankle MRI review. Pt was last seen by Dr. Georgina Snell on 03/08/21 and was advised to proceed to MRI. Today, pt reports L ankle is still sore. Pt notes pain is now also over the dorsum of the L foot and her 5th toe. Pt reports her L knee is still bothering her, esp w/ kneeling or pressure.  Dx imaging: 03/20/21 L ankle MRI  03/05/21 L foot MRI  01/05/21 L foot XR  Pertinent review of systems: No fevers or chills  Relevant historical information: History of breast cancer   Exam:  BP 108/76   Pulse 96   Ht 5\' 7"  (1.702 m)   Wt 200 lb 12.8 oz (91.1 kg)   SpO2 97%   BMI 31.45 kg/m  General: Well Developed, well nourished, and in no acute distress.   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 Tender palpation at the posterior tibialis tendon near the insertion on the navicular. Mildly tender palpation along the course of the distal peroneal tendon. Tender palpation at the subtalar joint laterally. Normal foot and ankle motion.  Intact strength. Fifth toe minimally tender palpation.    Lab and Radiology Results No results found for this or any previous visit (from the past 72 hour(s)). MR ANKLE LEFT WO CONTRAST  Result Date: 03/22/2021 CLINICAL DATA:  Chronic left ankle pain. History of prior surgery in 2004. EXAM: MRI OF THE LEFT ANKLE WITHOUT CONTRAST TECHNIQUE: Multiplanar, multisequence MR imaging of the ankle was performed. No intravenous contrast was administered. COMPARISON:  MRI left foot dated March 05, 2021. Left foot x-rays dated January 05, 2021. FINDINGS: TENDONS Peroneal: Peroneal longus tendon intact. High-grade split tear of the peroneal brevis tendon measuring approximately 8.5 cm in length. Posteromedial: Distal posterior tibial  tendinosis with small partial tear at the insertion and small amount of fluid in the tendon sheath. Flexor digitorum longus tendon intact with small amount of fluid in the tendon sheath. Flexor hallucis longus tendon intact with small amount of fluid in the tendon sheath of. Anterior: Tibialis anterior tendon intact. Extensor hallucis longus tendon intact Extensor digitorum longus tendon intact. Achilles:  Intact. Plantar Fascia: Intact. LIGAMENTS Lateral: Anterior talofibular ligament intact. Calcaneofibular ligament is degenerated and partially torn. Posterior talofibular ligament intact. Anterior and posterior tibiofibular ligaments intact. Medial: Deltoid ligament intact. Spring ligament intact. CARTILAGE Ankle Joint: No joint effusion. Small focus of marrow edema and cystic change in the medial talar dome with overlying cartilage loss. Subtalar Joints/Sinus Tarsi: Marrow edema and subcortical cystic change in the extra-articular lateral talar process and adjacent calcaneus. Partial loss of the normal fat within the sinus tarsi. Bones: Pes planovalgus. The distal fibula articulates with the lateral calcaneus. There is subcortical marrow edema and cystic change in the distal fibula. Mild midfoot osteoarthritis. Partially visualized first metatarsal hardware. No acute fracture or dislocation. No suspicious bone lesion. Soft Tissue: No soft tissue mass or fluid collection. IMPRESSION: 1. High-grade split tear of the peroneal brevis tendon measuring approximately 8.5 cm in length. 2. Distal posterior tibial tendinosis with small partial tear at the insertion. Mild flexor tenosynovitis. 3. 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 4. Small osteochondral lesion of the medial talar dome. Electronically Signed   By: Orville Govern.D.  On: 03/22/2021 10:38       EXAM: MRI OF THE LEFT FOOT WITHOUT CONTRAST   TECHNIQUE: Multiplanar, multisequence MR imaging of the left foot was performed.  No intravenous contrast was administered.   COMPARISON:  None.   FINDINGS: Bones/Joint/Cartilage   No acute fracture or dislocation.   Prior healed osteotomy of the first metatarsal. Mild osteoarthritis of the first MTP joint. Normal alignment. No joint effusion.   Mild osteoarthritis of the calcaneocuboid joint. Subcortical extra-articular reactive marrow changes involving the lateral talus and calcaneus as can be seen with posterolateral ankle impingement.   Ligaments   Collateral ligaments are intact.  Lisfranc ligament is intact.   Muscles and Tendons   Flexor, peroneal and extensor compartment tendons are intact. Muscles are normal.   Soft tissue No fluid collection or hematoma.  No soft tissue mass.   IMPRESSION: 1.  No acute osseous injury of the left foot. 2. Prior healed osteotomy of the first metatarsal. Mild osteoarthritis of the first MTP joint. 3. Subcortical extra-articular reactive marrow changes involving the lateral talus and calcaneus as can be seen with posterolateral ankle impingement.     Electronically Signed   By: Kathreen Devoid M.D.   On: 03/07/2021 10:32  I, [name], personally (independently) visualized and performed the interpretation of the images attached in this note.   Assessment and Plan: 80 y.o. female with   Left foot and ankle pain multifactorial.  Lateral ankle pain: Majority of the lateral ankle pain is due to the hindfoot impingement of the subtalar joint.  This is primarily because of her pes planus and pronation/medial ankle subluxation. The minority of the pain is due to the peroneal tendinitis seen on MRI.  She reports having a surgery of this tendon in 2004 however medical records are not available. Plan: We discussed options.  Plan to attempt to correct foot and ankle positioning with scaphoid pads.  Plan to continue conservative management and home exercise program and reassess in 1 month.  If not improved would  consider targeted injection of the subtalar joint lateral ankle into try to address the hindfoot impingement.  Ultimately may attempt to inject the peroneal tendon sheath but would like to avoid steroid injection of this area as it may increase risk of tendon rupture.  Medial ankle pain: Majority of pain due to the posterior tibialis tendinitis seen on MRI.  Some of the pain may be due to the osteochondral lesion talar dome seen on MRI. Plan: Again correction of foot pronation will help as well home exercise program.  Consider intra-articular steroid injection into the ankle and possibly into the tendon sheath if needed.  Fifth toe pain:  History of dislocation/fracture.  Did not appear significantly abnormal on MRI recently. Majority of the pain was due to some compression in her shoe due to narrow toebox.  Discussed some options about it and will widen her toebox.  Recheck in 1 month.    Discussed warning signs or symptoms. Please see discharge instructions. Patient expresses understanding.   The above documentation has been reviewed and is accurate and complete [name], M.D.   Total encounter time 30 minutes including face-to-face time with the patient and, reviewing past medical record, and charting on the date of service.   Reviewed MRI discussed treatment plan and options.

## 2021-03-28 ENCOUNTER — Other Ambulatory Visit: Payer: Self-pay | Admitting: Internal Medicine

## 2021-04-05 DIAGNOSIS — E119 Type 2 diabetes mellitus without complications: Secondary | ICD-10-CM | POA: Diagnosis not present

## 2021-04-19 NOTE — Progress Notes (Signed)
Faxed MM orders to [facility] [phone]

## 2021-04-25 ENCOUNTER — Other Ambulatory Visit: Payer: Self-pay

## 2021-04-25 ENCOUNTER — Ambulatory Visit: Payer: Medicare HMO | Admitting: Family Medicine

## 2021-04-25 ENCOUNTER — Ambulatory Visit: Payer: Self-pay

## 2021-04-25 VITALS — BP 118/80 | HR 87 | Ht 67.0 in | Wt 202.2 lb

## 2021-04-25 DIAGNOSIS — G8929 Other chronic pain: Secondary | ICD-10-CM | POA: Diagnosis not present

## 2021-04-25 DIAGNOSIS — M25572 Pain in left ankle and joints of left foot: Secondary | ICD-10-CM

## 2021-04-25 NOTE — Patient Instructions (Addendum)
Thank you for coming in today.   Let me know prior to our follow up visit if you want a referral for orthotics.  Recheck back in 2 months.

## 2021-04-25 NOTE — Progress Notes (Signed)
I, Peterson Lombard, LAT, ATC acting as a scribe for [name], MD.  Kelly [name] is a 80 y.o. female who presents to [facility] today for  f/u chronic L ankle and 5th toe pain. Pt was last seen by Dr. Georgina Snell on 03/24/21 and was advised to correct foot and ankle positioning with scaphoid pads, cont HEP, and widen the toebox. Today, pt reports L ankle is still painful intermittently and is feeling unstable at times. Pt has been wearing the scaphoid pad. Pt has not been working on [employer].  She did purchase some new shoes today from [employer].  She purchased [employer] motion control shoe  Dx imaging: 03/20/21 L ankle MRI             03/05/21 L foot MRI             01/05/21 L foot XR  Pertinent review of systems: No fevers or chills  Relevant historical information: Breast cancer history   Exam:  BP 118/80   Pulse 87   Ht 5\' 7"  (1.702 m)   Wt 202 lb 3.2 oz (91.7 kg)   SpO2 96%   BMI 31.67 kg/m  General: Well Developed, well nourished, and in no acute distress.   MSK: Left foot and ankle significant pronation.  Tender palpation lateral ankle. Pronation significantly controlled with new shoe.    Lab and Radiology Results  EXAM: MRI OF THE LEFT ANKLE WITHOUT CONTRAST   TECHNIQUE: Multiplanar, multisequence MR imaging of the ankle was performed. No intravenous contrast was administered.   COMPARISON:  MRI left foot dated March 05, 2021. Left foot x-rays dated January 05, 2021.   FINDINGS: TENDONS   Peroneal: Peroneal longus tendon intact. High-grade split tear of the peroneal brevis tendon measuring approximately 8.5 cm in length.   Posteromedial: Distal posterior tibial tendinosis with small partial tear at the insertion and small amount of fluid in the tendon sheath. Flexor digitorum longus tendon intact with small amount of fluid in the tendon sheath. Flexor hallucis longus tendon intact with small amount of fluid in the tendon  sheath of.   Anterior: Tibialis anterior tendon intact. Extensor hallucis longus tendon intact Extensor digitorum longus tendon intact.   Achilles:  Intact.   Plantar Fascia: Intact.   LIGAMENTS   Lateral: Anterior talofibular ligament intact. Calcaneofibular ligament is degenerated and partially torn. Posterior talofibular ligament intact. Anterior and posterior tibiofibular ligaments intact.   Medial: Deltoid ligament intact. Spring ligament intact.   CARTILAGE   Ankle Joint: No joint effusion. Small focus of marrow edema and cystic change in the medial talar dome with overlying cartilage loss.   Subtalar Joints/Sinus Tarsi: Marrow edema and subcortical cystic change in the extra-articular lateral talar process and adjacent calcaneus. Partial loss of the normal fat within the sinus tarsi.   Bones: Pes planovalgus. The distal fibula articulates with the lateral calcaneus. There is subcortical marrow edema and cystic change in the distal fibula. Mild midfoot osteoarthritis. Partially visualized first metatarsal hardware. No acute fracture or dislocation. No suspicious bone lesion.   Soft Tissue: No soft tissue mass or fluid collection.   IMPRESSION: 1. High-grade split tear of the peroneal brevis tendon measuring approximately 8.5 cm in length. 2. Distal posterior tibial tendinosis with small partial tear at the insertion. Mild flexor tenosynovitis. 3. 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 4. Small osteochondral lesion of the medial talar dome.     Electronically Signed   By:  Titus Dubin M.D.   On: 03/22/2021 10:38     Assessment and Plan: 80 y.o. female with left foot and ankle pain.  Multifactorial.  Primary pain is due to lateral impingement at subtalar joint.  This is caused by the excessive pronation.  Fundamentally we have been working on correcting the foot pronation with scaphoid pads and now a better shoe which does seem to  help.  Additionally strengthening program will help as well.  She notes that her pain is not bad enough now for an injection.  Plan to recheck with me in 2 months.  If she would like to proceed with orthotic she will let me know before the next visit and I could refer her to one of my colleagues for orthotics.  Certainly could proceed with injection at subtalar joint if needed.    Discussed warning signs or symptoms. Please see discharge instructions. Patient expresses understanding.   The above documentation has been reviewed and is accurate and complete [name], M.D.   Total encounter time 20 minutes including face-to-face time with the patient and, reviewing past medical record, and charting on the date of service.   Review footwear discussed treatment plan and options.

## 2021-04-26 ENCOUNTER — Ambulatory Visit (INDEPENDENT_AMBULATORY_CARE_PROVIDER_SITE_OTHER): Payer: Medicare HMO | Admitting: Internal Medicine

## 2021-04-26 ENCOUNTER — Encounter: Payer: Self-pay | Admitting: Internal Medicine

## 2021-04-26 VITALS — BP 118/82 | HR 72 | Resp 18 | Ht 67.0 in | Wt 200.0 lb

## 2021-04-26 DIAGNOSIS — Z9989 Dependence on other enabling machines and devices: Secondary | ICD-10-CM | POA: Diagnosis not present

## 2021-04-26 DIAGNOSIS — G4733 Obstructive sleep apnea (adult) (pediatric): Secondary | ICD-10-CM | POA: Diagnosis not present

## 2021-04-26 DIAGNOSIS — R7303 Prediabetes: Secondary | ICD-10-CM

## 2021-04-26 LAB — POCT GLYCOSYLATED HEMOGLOBIN (HGB A1C): Hemoglobin A1C: 5.9 % — AB (ref 4.0–5.6)

## 2021-04-26 NOTE — Progress Notes (Signed)
° °  Subjective:   Patient ID: Kelly [name], female    DOB: 1940/10/27, 80 y.o.   MRN: [id number]  HPI The patient is a 80 YO female coming in for follow up sugars. Down 10 pounds through dietary changes.  Review of Systems  Constitutional: Negative.   HENT: Negative.    Eyes: Negative.   Respiratory:  Negative for cough, chest tightness and shortness of breath.   Cardiovascular:  Negative for chest pain, palpitations and leg swelling.  Gastrointestinal:  Negative for abdominal distention, abdominal pain, constipation, diarrhea, nausea and vomiting.  Musculoskeletal: Negative.   Skin: Negative.   Neurological: Negative.   Psychiatric/Behavioral: Negative.     Objective:  Physical Exam Constitutional:      Appearance: She is well-developed.  HENT:     Head: Normocephalic and atraumatic.  Cardiovascular:     Rate and Rhythm: Normal rate and regular rhythm.  Pulmonary:     Effort: Pulmonary effort is normal. No respiratory distress.     Breath sounds: Normal breath sounds. No wheezing or rales.  Abdominal:     General: Bowel sounds are normal. There is no distension.     Palpations: Abdomen is soft.     Tenderness: There is no abdominal tenderness. There is no rebound.  Musculoskeletal:     Cervical back: Normal range of motion.  Skin:    General: Skin is warm and dry.  Neurological:     Mental Status: She is alert and oriented to person, place, and time.     Coordination: Coordination normal.    Vitals:   04/26/21 1010  BP: 118/82  Pulse: 72  Resp: 18  SpO2: 97%  Weight: 200 lb (90.7 kg)  Height: 5\' 7"  (1.702 m)    This visit occurred during the SARS-CoV-2 public health emergency.  Safety protocols were in place, including screening questions prior to the visit, additional usage of staff PPE, and extensive cleaning of exam room while observing appropriate contact time as indicated for disinfecting solutions.   Assessment & Plan:

## 2021-04-26 NOTE — Assessment & Plan Note (Signed)
Making dietary changes and down 10 pounds in the last 6 months. HgA1c down to 5.9 today did POC HgA1c in the office. Advised to continue with lifestyle changes and reinforced those.

## 2021-04-26 NOTE — Patient Instructions (Addendum)
Your HgA1c is better today at 5.9

## 2021-04-26 NOTE — Assessment & Plan Note (Signed)
Rx given to her for new CPAP as she has had hers 5 years and some problems.

## 2021-04-27 DIAGNOSIS — H2512 Age-related nuclear cataract, left eye: Secondary | ICD-10-CM | POA: Diagnosis not present

## 2021-04-27 DIAGNOSIS — H02834 Dermatochalasis of left upper eyelid: Secondary | ICD-10-CM | POA: Diagnosis not present

## 2021-04-27 DIAGNOSIS — H40013 Open angle with borderline findings, low risk, bilateral: Secondary | ICD-10-CM | POA: Diagnosis not present

## 2021-04-27 DIAGNOSIS — H43813 Vitreous degeneration, bilateral: Secondary | ICD-10-CM | POA: Diagnosis not present

## 2021-04-27 DIAGNOSIS — H2513 Age-related nuclear cataract, bilateral: Secondary | ICD-10-CM | POA: Diagnosis not present

## 2021-04-28 ENCOUNTER — Encounter: Payer: Self-pay | Admitting: Adult Health

## 2021-04-28 DIAGNOSIS — Z853 Personal history of malignant neoplasm of breast: Secondary | ICD-10-CM | POA: Diagnosis not present

## 2021-04-28 DIAGNOSIS — Z780 Asymptomatic menopausal state: Secondary | ICD-10-CM | POA: Diagnosis not present

## 2021-05-02 ENCOUNTER — Encounter: Payer: Self-pay | Admitting: Family Medicine

## 2021-05-02 ENCOUNTER — Other Ambulatory Visit: Payer: Self-pay

## 2021-05-02 ENCOUNTER — Encounter: Payer: Self-pay | Admitting: Physical Therapy

## 2021-05-02 DIAGNOSIS — M79671 Pain in right foot: Secondary | ICD-10-CM

## 2021-05-02 DIAGNOSIS — G8929 Other chronic pain: Secondary | ICD-10-CM

## 2021-05-02 DIAGNOSIS — Z170 Estrogen receptor positive status [ER+]: Secondary | ICD-10-CM

## 2021-05-02 DIAGNOSIS — M25572 Pain in left ankle and joints of left foot: Secondary | ICD-10-CM

## 2021-05-02 DIAGNOSIS — M79672 Pain in left foot: Secondary | ICD-10-CM

## 2021-05-02 MED ORDER — MINOXIDIL 2.5 MG PO TABS: ORAL_TABLET | ORAL | 0 refills | Status: DC

## 2021-05-19 DIAGNOSIS — Z853 Personal history of malignant neoplasm of breast: Secondary | ICD-10-CM | POA: Diagnosis not present

## 2021-05-19 DIAGNOSIS — Z01411 Encounter for gynecological examination (general) (routine) with abnormal findings: Secondary | ICD-10-CM | POA: Diagnosis not present

## 2021-05-19 DIAGNOSIS — N952 Postmenopausal atrophic vaginitis: Secondary | ICD-10-CM | POA: Diagnosis not present

## 2021-05-23 NOTE — Addendum Note (Signed)
Addended by: Gregor Hams on: 05/23/2021 07:57 AM   Modules accepted: Orders

## 2021-05-24 ENCOUNTER — Ambulatory Visit: Payer: Medicare HMO | Admitting: Family Medicine

## 2021-05-24 ENCOUNTER — Encounter: Payer: Self-pay | Admitting: Family Medicine

## 2021-05-24 VITALS — Ht 67.0 in | Wt 200.0 lb

## 2021-05-24 DIAGNOSIS — M25872 Other specified joint disorders, left ankle and foot: Secondary | ICD-10-CM

## 2021-05-24 HISTORY — DX: Other specified joint disorders, left ankle and foot: M25.872

## 2021-05-24 NOTE — Assessment & Plan Note (Signed)
Has impingement on the left with limited longitudinal arch.  -Counseled on home exercise therapy and supportive care. -Orthotics with scaphoid on the left. -Could consider metatarsal pads.

## 2021-05-24 NOTE — Progress Notes (Signed)
°  Kelly [name] - 81 y.o. female MRN [id number]  Date of birth: September 11, 1940  SUBJECTIVE:  Including CC & ROS.  No chief complaint on file.   Kelly [name] is a 81 y.o. female that is presenting with acute left foot and ankle pain.  Recent MRI was showing different changes in around the foot.  Pain is worse with prolonged walking.   Review of Systems See HPI   HISTORY: Past Medical, Surgical, Social, and Family History Reviewed & Updated per EMR.   Pertinent Historical Findings include:  Past Medical History:  Diagnosis Date   Allergy    Blood transfusion without reported diagnosis    Breast cancer ([place])    Breast mass    fibocystic breast dx   Cataract    Family history of colon cancer 04/15/2020   Family history of pancreatic cancer 04/15/2020   Family history of prostate cancer 04/15/2020   Hepatitis    CMV 1992   Hyperlipidemia    Postmenopausal HRT (hormone replacement therapy)    Scarlet fever as a teen   Sleep apnea     Past Surgical History:  Procedure Laterality Date   APPENDECTOMY     BREAST LUMPECTOMY WITH RADIOACTIVE SEED LOCALIZATION Right 04/27/2020   Procedure: RIGHT BREAST LUMPECTOMY WITH RADIOACTIVE SEED LOCALIZATION;  Surgeon: [name], MD;  Location: [facility];  Service: General;  Laterality: Right;   BUNIONECTOMY     caesarean section     COLONOSCOPY     CORRECTION HAMMER TOE     FOOT TENDON SURGERY     left    POLYPECTOMY     TONSILLECTOMY     TONSILLECTOMY       PHYSICAL EXAM:  VS: Ht 5\' 7"  (1.702 m)    Wt 200 lb (90.7 kg)    BMI 31.32 kg/m  Physical Exam Gen: NAD, alert, cooperative with exam, well-appearing MSK: Neurovascularly intact    Patient was fitted for a standard, cushioned, semi-rigid orthotic. The orthotic was heated and afterward the patient stood on the orthotic blank positioned on the orthotic stand. The patient was positioned in subtalar neutral position and 10 degrees of ankle dorsiflexion in a weight bearing  stance. After completion of molding, a stable base was applied to the orthotic blank. The blank was ground to a stable position for weight bearing. Size: 11 Pairs: 2 Base: Blue EVA Additional Posting and Padding: left scaphoid pad The patient ambulated these, and they were very comfortable.     ASSESSMENT & PLAN:   Ankle impingement syndrome, left Has impingement on the left with limited longitudinal arch.  -Counseled on home exercise therapy and supportive care. -Orthotics with scaphoid on the left. -Could consider metatarsal pads.

## 2021-05-30 ENCOUNTER — Encounter: Payer: Self-pay | Admitting: Internal Medicine

## 2021-05-31 DIAGNOSIS — E119 Type 2 diabetes mellitus without complications: Secondary | ICD-10-CM | POA: Diagnosis not present

## 2021-06-03 DIAGNOSIS — G4733 Obstructive sleep apnea (adult) (pediatric): Secondary | ICD-10-CM | POA: Diagnosis not present

## 2021-06-23 ENCOUNTER — Other Ambulatory Visit: Payer: Self-pay | Admitting: Adult Health

## 2021-06-23 DIAGNOSIS — C50411 Malignant neoplasm of upper-outer quadrant of right female breast: Secondary | ICD-10-CM

## 2021-06-23 DIAGNOSIS — Z170 Estrogen receptor positive status [ER+]: Secondary | ICD-10-CM

## 2021-06-24 NOTE — Progress Notes (Signed)
I, [name], LAT, ATC, am serving as scribe for Dr. [name].  Kelly [name] is a 81 y.o. female who presents to [place] [facility] today for f/u chronic L ankle and 5th toe pain. Pt was last seen by Dr. Georgina Snell on 04/25/21 and was advised to communicate if she was interest in orthotics, which she did and orthotics were made by Dr. Raeford Razor on 05/24/21. Today, pt reports that her L ankle and foot pain is worse.  She states that the orthotics seem to help w/ her foot stability but has not helped w/ her pain.  She has recently joined [employer] and does like the aquatic therapy sessions.  Dx imaging: 03/20/21 L ankle MRI             03/05/21 L foot MRI             01/05/21 L foot XR  Pertinent review of systems: No fevers or chills  Relevant historical information: History of breast cancer   Exam:  BP 102/70 (BP Location: Right Arm, Patient Position: Sitting, Cuff Size: Normal)    Pulse 85    Ht 5\' 7"  (1.702 m)    Wt 199 lb 3.2 oz (90.4 kg)    SpO2 97%    BMI 31.20 kg/m  General: Well Developed, well nourished, and in no acute distress.   MSK: Left foot and ankle significant pronation. Tender palpation anterior lateral ankle especially standing and foot dorsiflexion.    Lab and Radiology Results   EXAM: MRI OF THE LEFT ANKLE WITHOUT CONTRAST   TECHNIQUE: Multiplanar, multisequence MR imaging of the ankle was performed. No intravenous contrast was administered.   COMPARISON:  MRI left foot dated March 05, 2021. Left foot x-rays dated January 05, 2021.   FINDINGS: TENDONS   Peroneal: Peroneal longus tendon intact. High-grade split tear of the peroneal brevis tendon measuring approximately 8.5 cm in length.   Posteromedial: Distal posterior tibial tendinosis with small partial tear at the insertion and small amount of fluid in the tendon sheath. Flexor digitorum longus tendon intact with small amount of fluid in the tendon sheath. Flexor hallucis  longus tendon intact with small amount of fluid in the tendon sheath of.   Anterior: Tibialis anterior tendon intact. Extensor hallucis longus tendon intact Extensor digitorum longus tendon intact.   Achilles:  Intact.   Plantar Fascia: Intact.   LIGAMENTS   Lateral: Anterior talofibular ligament intact. Calcaneofibular ligament is degenerated and partially torn. Posterior talofibular ligament intact. Anterior and posterior tibiofibular ligaments intact.   Medial: Deltoid ligament intact. Spring ligament intact.   CARTILAGE   Ankle Joint: No joint effusion. Small focus of marrow edema and cystic change in the medial talar dome with overlying cartilage loss.   Subtalar Joints/Sinus Tarsi: Marrow edema and subcortical cystic change in the extra-articular lateral talar process and adjacent calcaneus. Partial loss of the normal fat within the sinus tarsi.   Bones: Pes planovalgus. The distal fibula articulates with the lateral calcaneus. There is subcortical marrow edema and cystic change in the distal fibula. Mild midfoot osteoarthritis. Partially visualized first metatarsal hardware. No acute fracture or dislocation. No suspicious bone lesion.   Soft Tissue: No soft tissue mass or fluid collection.   IMPRESSION: 1. High-grade split tear of the peroneal brevis tendon measuring approximately 8.5 cm in length. 2. Distal posterior tibial tendinosis with small partial tear at the insertion. Mild flexor tenosynovitis. 3. 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 4.  Small osteochondral lesion of the medial talar dome.     Electronically Signed   By: Titus Dubin M.D.   On: 03/22/2021 10:38   EXAM: MRI OF THE LEFT FOOT WITHOUT CONTRAST   TECHNIQUE: Multiplanar, multisequence MR imaging of the left foot was performed. No intravenous contrast was administered.   COMPARISON:  None.   FINDINGS: Bones/Joint/Cartilage   No acute fracture or  dislocation.   Prior healed osteotomy of the first metatarsal. Mild osteoarthritis of the first MTP joint. Normal alignment. No joint effusion.   Mild osteoarthritis of the calcaneocuboid joint. Subcortical extra-articular reactive marrow changes involving the lateral talus and calcaneus as can be seen with posterolateral ankle impingement.   Ligaments   Collateral ligaments are intact.  Lisfranc ligament is intact.   Muscles and Tendons   Flexor, peroneal and extensor compartment tendons are intact. Muscles are normal.   Soft tissue No fluid collection or hematoma.  No soft tissue mass.   IMPRESSION: 1.  No acute osseous injury of the left foot. 2. Prior healed osteotomy of the first metatarsal. Mild osteoarthritis of the first MTP joint. 3. Subcortical extra-articular reactive marrow changes involving the lateral talus and calcaneus as can be seen with posterolateral ankle impingement.     Electronically Signed   By: Kathreen Devoid M.D.   On: 03/07/2021 10:32  I, [name], personally (independently) visualized and performed the interpretation of the images attached in this note.    Assessment and Plan: 81 y.o. female with left anterior lateral foot pain.  Patient has so much foot pronation that I think she is having impingement at the anterior lateral ankle.  She already has good orthotics by Dr. Clearance Coots with scaphoid pads.  We will try to maximize the arch support by adding another scaphoid pad to the left orthotic.  This should put her foot more into a neutral position.  Additionally she experiences pain with more foot dorsiflexion so I have added heel lifts to both shoes to reduce how much foot dorsiflexion she gets into with normal walking.  She found the modification to her footwear to be comfortable and reduced her pain with ambulation.  We will see how this goes for the next month and check back in 1 month.  If not better consider injection at the area of  pain.   Discussed warning signs or symptoms. Please see discharge instructions. Patient expresses understanding.   The above documentation has been reviewed and is accurate and complete [name], M.D.

## 2021-06-27 ENCOUNTER — Ambulatory Visit (INDEPENDENT_AMBULATORY_CARE_PROVIDER_SITE_OTHER): Payer: Medicare HMO | Admitting: Family Medicine

## 2021-06-27 ENCOUNTER — Encounter: Payer: Self-pay | Admitting: Family Medicine

## 2021-06-27 ENCOUNTER — Other Ambulatory Visit: Payer: Self-pay

## 2021-06-27 VITALS — BP 102/70 | HR 85 | Ht 67.0 in | Wt 199.2 lb

## 2021-06-27 DIAGNOSIS — M25872 Other specified joint disorders, left ankle and foot: Secondary | ICD-10-CM | POA: Diagnosis not present

## 2021-06-27 NOTE — Patient Instructions (Addendum)
Good to see you today. ? ?Follow-up: one month ?

## 2021-06-28 ENCOUNTER — Encounter (HOSPITAL_COMMUNITY): Payer: Self-pay

## 2021-07-12 DIAGNOSIS — H25011 Cortical age-related cataract, right eye: Secondary | ICD-10-CM | POA: Diagnosis not present

## 2021-07-12 DIAGNOSIS — H2512 Age-related nuclear cataract, left eye: Secondary | ICD-10-CM | POA: Diagnosis not present

## 2021-07-12 DIAGNOSIS — H25041 Posterior subcapsular polar age-related cataract, right eye: Secondary | ICD-10-CM | POA: Diagnosis not present

## 2021-07-19 DIAGNOSIS — H2511 Age-related nuclear cataract, right eye: Secondary | ICD-10-CM | POA: Diagnosis not present

## 2021-07-20 ENCOUNTER — Other Ambulatory Visit: Payer: Self-pay

## 2021-07-20 MED ORDER — ANASTROZOLE 1 MG PO TABS: 1.0000 mg | ORAL_TABLET | Freq: Every day | ORAL | 3 refills | Status: DC

## 2021-07-25 ENCOUNTER — Other Ambulatory Visit: Payer: Self-pay

## 2021-07-25 ENCOUNTER — Encounter: Payer: Self-pay | Admitting: Family Medicine

## 2021-07-25 ENCOUNTER — Ambulatory Visit: Payer: Medicare HMO | Admitting: Family Medicine

## 2021-07-25 VITALS — BP 138/80 | HR 81 | Ht 67.0 in | Wt 203.2 lb

## 2021-07-25 DIAGNOSIS — M25872 Other specified joint disorders, left ankle and foot: Secondary | ICD-10-CM

## 2021-07-25 DIAGNOSIS — M79672 Pain in left foot: Secondary | ICD-10-CM

## 2021-07-25 NOTE — Patient Instructions (Addendum)
Good to see you today. ? ?Resume exercise when cleared by your eye doctor. ? ?Follow-up: as needed ?

## 2021-07-25 NOTE — Progress Notes (Signed)
? ?  I, [name], LAT, ATC, am serving as scribe for Dr. [name]. ? ?Kelly [name] is a 81 y.o. female who presents to [facility] today for f/u L ankle impingement syndrome. Pt had orthotics made by Dr. Raeford Razor on 05/24/21. Pt was last seen by Dr. Georgina Snell on 06/27/21 and a L scaphoid pad was added to her orthotics. Today, pt reports that her L ankle is sore and wobbly in the morning until she gets up and going.  She notes that once her ankle warms up she's able to walk.  She does not have excruciating pain.  She has recently had cataract surgery so she's not been exercising recently.  She enjoys going to [employer] group exercise classes but is not able to go to those currently due to her cataract surgeries. ? ?Dx imaging: 03/20/21 L ankle MRI ?            03/05/21 L foot MRI ?            01/05/21 L foot XR ? ?Pertinent review of systems: No fevers or chills ? ?Relevant historical information: History of breast cancer. ? ? ?Exam:  ?BP 138/80 (BP Location: Left Arm, Patient Position: Sitting, Cuff Size: Normal)   Pulse 81   Ht '5\' 7"'$  (1.702 m)   Wt 203 lb 3.2 oz (92.2 kg)   SpO2 95%   BMI 31.83 kg/m?  ?General: Well Developed, well nourished, and in no acute distress.  ? ?MSK: Left foot and ankle nontender normal motion.  Mild antalgic gait. ? ? ? ? ?Assessment and Plan: ?81 y.o. female with left foot and ankle pain.  Overall doing better with home exercise program and good supportive shoe with medial arch support. ? ?Plan to continue home exercise program and exercise in the gym.  She also is doing water aerobics which are excellent.  Spent time talking about treatment plan and options.  Certainly could add in physical therapy again if needed.  She will let me know.  Check back with me as needed. ? ?Total encounter time 20 minutes including face-to-face time with the patient and, reviewing past medical record, and charting on the date of service.   ? ? ?Discussed warning signs  or symptoms. Please see discharge instructions. Patient expresses understanding. ? ? ?The above documentation has been reviewed and is accurate and complete [name], M.D. ? ? ?

## 2021-07-26 DIAGNOSIS — E119 Type 2 diabetes mellitus without complications: Secondary | ICD-10-CM | POA: Diagnosis not present

## 2021-08-21 ENCOUNTER — Other Ambulatory Visit: Payer: Self-pay | Admitting: Adult Health

## 2021-08-21 DIAGNOSIS — C50411 Malignant neoplasm of upper-outer quadrant of right female breast: Secondary | ICD-10-CM

## 2021-09-01 DIAGNOSIS — G4733 Obstructive sleep apnea (adult) (pediatric): Secondary | ICD-10-CM | POA: Diagnosis not present

## 2021-09-07 ENCOUNTER — Telehealth: Payer: Self-pay | Admitting: Internal Medicine

## 2021-09-07 NOTE — Telephone Encounter (Signed)
LVM for pt to rtn my call to schedule AWV with [facility]. Please schedule appt if pt calls the office.  

## 2021-09-16 ENCOUNTER — Ambulatory Visit (INDEPENDENT_AMBULATORY_CARE_PROVIDER_SITE_OTHER): Payer: Medicare HMO

## 2021-09-16 DIAGNOSIS — Z0000 Encounter for general adult medical examination without abnormal findings: Secondary | ICD-10-CM

## 2021-09-16 NOTE — Patient Instructions (Signed)
Ms. Romano , ?Thank you for taking time to come for your Medicare Wellness Visit. I appreciate your ongoing commitment to your health goals. Please review the following plan we discussed and let me know if I can assist you in the future.  ? ?Screening recommendations/referrals: ?Colonoscopy: no longer required  ?Mammogram: no longer required  ?Bone Density: 04/28/2021 ?Recommended yearly ophthalmology/optometry visit for glaucoma screening and checkup ?Recommended yearly dental visit for hygiene and checkup ? ?Vaccinations: ?Influenza vaccine: completed  ?Pneumococcal vaccine: completed  ?Tdap vaccine: 11/29/2011 ?Shingles vaccine: completed    ? ?Advanced directives: none  ? ?Conditions/risks identified: none  ? ?Next appointment: none  ? ? ?Preventive Care 36 Years and Older, Female ?Preventive care refers to lifestyle choices and visits with your health care provider that can promote health and wellness. ?What does preventive care include? ?A yearly physical exam. This is also called an annual well check. ?Dental exams once or twice a year. ?Routine eye exams. Ask your health care provider how often you should have your eyes checked. ?Personal lifestyle choices, including: ?Daily care of your teeth and gums. ?Regular physical activity. ?Eating a healthy diet. ?Avoiding tobacco and drug use. ?Limiting alcohol use. ?Practicing safe sex. ?Taking low-dose aspirin every day. ?Taking vitamin and mineral supplements as recommended by your health care provider. ?What happens during an annual well check? ?The services and screenings done by your health care provider during your annual well check will depend on your age, overall health, lifestyle risk factors, and family history of disease. ?Counseling  ?Your health care provider may ask you questions about your: ?Alcohol use. ?Tobacco use. ?Drug use. ?Emotional well-being. ?Home and relationship well-being. ?Sexual activity. ?Eating habits. ?History of falls. ?Memory and  ability to understand (cognition). ?Work and work Statistician. ?Reproductive health. ?Screening  ?You may have the following tests or measurements: ?Height, weight, and BMI. ?Blood pressure. ?Lipid and cholesterol levels. These may be checked every 5 years, or more frequently if you are over 33 years old. ?Skin check. ?Lung cancer screening. You may have this screening every year starting at age 69 if you have a 30-pack-year history of smoking and currently smoke or have quit within the past 15 years. ?Fecal occult blood test (FOBT) of the stool. You may have this test every year starting at age 13. ?Flexible sigmoidoscopy or colonoscopy. You may have a sigmoidoscopy every 5 years or a colonoscopy every 10 years starting at age 98. ?Hepatitis C blood test. ?Hepatitis B blood test. ?Sexually transmitted disease (STD) testing. ?Diabetes screening. This is done by checking your blood sugar (glucose) after you have not eaten for a while (fasting). You may have this done every 1-3 years. ?Bone density scan. This is done to screen for osteoporosis. You may have this done starting at age 58. ?Mammogram. This may be done every 1-2 years. Talk to your health care provider about how often you should have regular mammograms. ?Talk with your health care provider about your test results, treatment options, and if necessary, the need for more tests. ?Vaccines  ?Your health care provider may recommend certain vaccines, such as: ?Influenza vaccine. This is recommended every year. ?Tetanus, diphtheria, and acellular pertussis (Tdap, Td) vaccine. You may need a Td booster every 10 years. ?Zoster vaccine. You may need this after age 31. ?Pneumococcal 13-valent conjugate (PCV13) vaccine. One dose is recommended after age 15. ?Pneumococcal polysaccharide (PPSV23) vaccine. One dose is recommended after age 79. ?Talk to your health care provider about which screenings and vaccines  you need and how often you need them. ?This information is  not intended to replace advice given to you by your health care provider. Make sure you discuss any questions you have with your health care provider. ?Document Released: 05/28/2015 Document Revised: 01/19/2016 Document Reviewed: 03/02/2015 ?Elsevier Interactive Patient Education ? 2017 [place]. ? ?Fall Prevention in the Home ?Falls can cause injuries. They can happen to people of all ages. There are many things you can do to make your home safe and to help prevent falls. ?What can I do on the outside of my home? ?Regularly fix the edges of walkways and driveways and fix any cracks. ?Remove anything that might make you trip as you walk through a door, such as a raised step or threshold. ?Trim any bushes or trees on the path to your home. ?Use bright outdoor lighting. ?Clear any walking paths of anything that might make someone trip, such as rocks or tools. ?Regularly check to see if handrails are loose or broken. Make sure that both sides of any steps have handrails. ?Any raised decks and porches should have guardrails on the edges. ?Have any leaves, snow, or ice cleared regularly. ?Use sand or salt on walking paths during winter. ?Clean up any spills in your garage right away. This includes oil or grease spills. ?What can I do in the bathroom? ?Use night lights. ?Install grab bars by the toilet and in the tub and shower. Do not use towel bars as grab bars. ?Use non-skid mats or decals in the tub or shower. ?If you need to sit down in the shower, use a plastic, non-slip stool. ?Keep the floor dry. Clean up any water that spills on the floor as soon as it happens. ?Remove soap buildup in the tub or shower regularly. ?Attach bath mats securely with double-sided non-slip rug tape. ?Do not have throw rugs and other things on the floor that can make you trip. ?What can I do in the bedroom? ?Use night lights. ?Make sure that you have a light by your bed that is easy to reach. ?Do not use any sheets or blankets that  are too big for your bed. They should not hang down onto the floor. ?Have a firm chair that has side arms. You can use this for support while you get dressed. ?Do not have throw rugs and other things on the floor that can make you trip. ?What can I do in the kitchen? ?Clean up any spills right away. ?Avoid walking on wet floors. ?Keep items that you use a lot in easy-to-reach places. ?If you need to reach something above you, use a strong step stool that has a grab bar. ?Keep electrical cords out of the way. ?Do not use floor polish or wax that makes floors slippery. If you must use wax, use non-skid floor wax. ?Do not have throw rugs and other things on the floor that can make you trip. ?What can I do with my stairs? ?Do not leave any items on the stairs. ?Make sure that there are handrails on both sides of the stairs and use them. Fix handrails that are broken or loose. Make sure that handrails are as long as the stairways. ?Check any carpeting to make sure that it is firmly attached to the stairs. Fix any carpet that is loose or worn. ?Avoid having throw rugs at the top or bottom of the stairs. If you do have throw rugs, attach them to the floor with carpet tape. ?Make sure  that you have a light switch at the top of the stairs and the bottom of the stairs. If you do not have them, ask someone to add them for you. ?What else can I do to help prevent falls? ?Wear shoes that: ?Do not have high heels. ?Have rubber bottoms. ?Are comfortable and fit you well. ?Are closed at the toe. Do not wear sandals. ?If you use a stepladder: ?Make sure that it is fully opened. Do not climb a closed stepladder. ?Make sure that both sides of the stepladder are locked into place. ?Ask someone to hold it for you, if possible. ?Clearly mark and make sure that you can see: ?Any grab bars or handrails. ?First and last steps. ?Where the edge of each step is. ?Use tools that help you move around (mobility aids) if they are needed. These  include: ?Canes. ?Walkers. ?Scooters. ?Crutches. ?Turn on the lights when you go into a dark area. Replace any light bulbs as soon as they burn out. ?Set up your furniture so you have a clear path. Avoid

## 2021-09-16 NOTE — Progress Notes (Signed)
? ?Subjective:  ? Kelly [name] is a 81 y.o. female who presents for Medicare Annual (Subsequent) preventive examination. ? ? ?I connected with Kelly [name] today by telephone and verified that I am speaking with the correct person using two identifiers. ?Location patient: home ?Location provider: work ?Persons participating in the virtual visit: patient, provider. ?  ?I discussed the limitations, risks, security and privacy concerns of performing an evaluation and management service by telephone and the availability of in person appointments. I also discussed with the patient that there may be a patient responsible charge related to this service. The patient expressed understanding and verbally consented to this telephonic visit.  ?  ?Interactive audio and video telecommunications were attempted between this provider and patient, however failed, due to patient having technical difficulties OR patient did not have access to video capability.  We continued and completed visit with audio only. ? ?  ?Review of Systems    ? ?Cardiac Risk Factors include: advanced age (>23mn, >>3women) ? ?   ?Objective:  ?  ?Today's Vitals  ? ?There is no height or weight on file to calculate BMI. ? ? ?  09/16/2021  ?  2:16 PM 11/30/2020  ?  8:46 AM 08/17/2020  ?  2:05 PM 06/22/2020  ?  3:11 PM 05/26/2020  ? 10:07 AM 04/20/2020  ?  9:10 AM 06/26/2018  ?  8:03 AM  ?Advanced Directives  ?Does Patient Have a Medical Advance Directive? Yes Yes Yes Yes Yes Yes No  ?Type of Advance Directive Healthcare Power of Attorney Living will;Healthcare Power of [place] will [place] will [place] will   ?Does patient want to make changes to medical advance directive?  No - Patient declined  No - Patient declined No - Patient declined No - Patient declined   ?Copy of HM[place]in Chart? No - copy requested Yes - validated most recent copy scanned in  chart (See row information)  No - copy requested No - copy requested No - copy requested   ?Would patient like information on creating a medical advance directive?       No - Patient declined  ? ? ?Current Medications (verified) ?Outpatient Encounter Medications as of 09/16/2021  ?Medication Sig  ? anastrozole (ARIMIDEX) 1 MG tablet Take 1 tablet (1 mg total) by mouth daily.  ? atorvastatin (LIPITOR) 40 MG tablet TAKE 1 TABLET BY MOUTH EVERY DAY  ? Calcium Citrate (CITRACAL PO) Take 2 tablets by mouth daily.  ? cholecalciferol (VITAMIN D3) 25 MCG (1000 UNIT) tablet Take 1,000 Units by mouth daily.  ? Coenzyme Q10 (COQ10) 200 MG CAPS Take 200 mg by mouth daily.  ? minoxidil (LONITEN) 2.5 MG tablet TAKE 1/2 TABLET BY MOUTH EVERY DAY  ? Multiple Vitamins-Minerals (SPECTRAVITE) TABS Take 1 tablet by mouth daily.  ? Omega-3 Fatty Acids (FISH OIL) 1200 MG CAPS Take 1,200 mg by mouth daily.  ? methocarbamol (ROBAXIN) 500 MG tablet Take 500 mg by mouth 4 (four) times daily. (Patient not taking: Reported on 09/16/2021)  ? ?No facility-administered encounter medications on file as of 09/16/2021.  ? ? ?Allergies (verified) ?Amoxicillin and Codeine  ? ?History: ?Past Medical History:  ?Diagnosis Date  ? Allergy   ? Blood transfusion without reported diagnosis   ? [place]   ? Breast mass   ? fibocystic breast dx  ? Cataract   ? Family history of colon cancer 04/15/2020  ?  Family history of pancreatic cancer 04/15/2020  ? Family history of prostate cancer 04/15/2020  ? Hepatitis   ? CMV 1992  ? Hyperlipidemia   ? Postmenopausal HRT (hormone replacement therapy)   ? Scarlet fever as a teen  ? Sleep apnea   ? ?Past Surgical History:  ?Procedure Laterality Date  ? APPENDECTOMY    ? BREAST LUMPECTOMY WITH RADIOACTIVE SEED LOCALIZATION Right 04/27/2020  ? Procedure: RIGHT BREAST LUMPECTOMY WITH RADIOACTIVE SEED LOCALIZATION;  Surgeon: [name], MD;  Location: [facility];  Service: General;  Laterality: Right;  ? BUNIONECTOMY     ? caesarean section    ? COLONOSCOPY    ? CORRECTION HAMMER TOE    ? FOOT TENDON SURGERY    ? left   ? POLYPECTOMY    ? TONSILLECTOMY    ? TONSILLECTOMY    ? ?Family History  ?Problem Relation Age of Onset  ? Pancreatic cancer Mother 68  ? Hyperlipidemia Mother   ? Hypertension Mother   ? Polymyalgia rheumatica Mother   ? Dementia Father   ? Basal cell carcinoma Father   ?     dx after 35, sun exposure  ? Ovarian cancer Paternal Aunt   ?     dx 78s  ? Ovarian cancer Paternal Grandmother   ?     d. early 5s  ? Colon cancer Paternal Grandfather   ?     dx early 68s  ? Liver cancer Maternal Uncle   ?     dx 62s  ? Prostate cancer Maternal Uncle   ?     dx >50  ? Leukemia Cousin 76  ?     maternal cousin   ? ?Social History  ? ?Socioeconomic History  ? Marital status: Single  ?  Spouse name: Not on file  ? Number of children: 2  ? Years of education: 21  ? Highest education level: Not on file  ?Occupational History  ? Occupation: MORTGAGE CONSULTANT  ?  Employer: [employer]  ?  Comment: webinars on financial management  ?  Employer: [employer]  ?Tobacco Use  ? Smoking status: Never  ? Smokeless tobacco: Never  ?Vaping Use  ? Vaping Use: Never used  ?Substance and Sexual Activity  ? Alcohol use: Yes  ?  Comment: rarely will have a glass of wine  ? Drug use: No  ? Sexual activity: Yes  ?  Partners: Male  ?Other Topics Concern  ? Not on file  ?Social History Narrative  ? [employer]; Master's [employer]. Lives alone and independently. Married '67--24 yrs. - widowed '92.  2 dtrs - '69, '73; 6 grandchildren. ACP/EoL - yes CPR, yes for short term ventilation, no prolonged heroic care in an irreversible state.   ? ?Social Determinants of Health  ? ?Financial Resource Strain: Low Risk   ? Difficulty of Paying Living Expenses: Not hard at all  ?Food Insecurity: No Food Insecurity  ? Worried About Charity fundraiser in the Last Year: Never true  ? Ran Out of Food in the Last Year: Never true   ?Transportation Needs: No Transportation Needs  ? Lack of Transportation (Medical): No  ? Lack of Transportation (Non-Medical): No  ?Physical Activity: Sufficiently Active  ? Days of Exercise per Week: 5 days  ? Minutes of Exercise per Session: 60 min  ?Stress: No Stress Concern Present  ? Feeling of Stress : Not at all  ?Social Connections: Socially Integrated  ?  Frequency of Communication with Friends and Family: Three times a week  ? Frequency of Social Gatherings with Friends and Family: Three times a week  ? Attends Religious Services: More than 4 times per year  ? Active Member of Clubs or Organizations: Yes  ? Attends Archivist Meetings: More than 4 times per year  ? Marital Status: Living with partner  ? ? ?Tobacco Counseling ?Counseling given: Not Answered ? ? ?Clinical Intake: ? ?Pre-visit preparation completed: Yes ? ?Pain : No/denies pain ? ?  ? ?Nutritional Risks: None ?Diabetes: No ? ?How often do you need to have someone help you when you read instructions, pamphlets, or other written materials from your doctor or pharmacy?: 1 - Never ?What is the last grade level you completed in school?: masters ? ?Diabetic?no  ? ?Interpreter Needed?: No ? ?Information entered by :: [email or web address] ? ? ?Activities of Daily Living ? ?  09/16/2021  ?  2:17 PM  ?In your present state of health, do you have any difficulty performing the following activities:  ?Hearing? 0  ?Vision? 0  ?Difficulty concentrating or making decisions? 0  ?Walking or climbing stairs? 0  ?Dressing or bathing? 0  ?Doing errands, shopping? 0  ?Preparing Food and eating ? N  ?Using the Toilet? N  ?In the past six months, have you accidently leaked urine? N  ?Do you have problems with loss of bowel control? N  ?Managing your Medications? N  ?Managing your Finances? N  ?Housekeeping or managing your Housekeeping? N  ? ? ?Patient Care Team: ?Hoyt Koch, MD as PCP - General (Internal Medicine) ?[name], MD as Consulting  Physician (General Surgery) ?Lyndal Pulley, DO as Consulting Physician (Sports Medicine) ?Key, Nelia Shi, NP as Nurse Practitioner (Gynecology) ?Benay Pike, MD as Consulting Physician (Hematology a

## 2021-09-20 ENCOUNTER — Other Ambulatory Visit: Payer: Self-pay | Admitting: Hematology and Oncology

## 2021-09-20 DIAGNOSIS — Z170 Estrogen receptor positive status [ER+]: Secondary | ICD-10-CM

## 2021-10-06 DIAGNOSIS — H903 Sensorineural hearing loss, bilateral: Secondary | ICD-10-CM | POA: Diagnosis not present

## 2021-11-08 ENCOUNTER — Other Ambulatory Visit (HOSPITAL_BASED_OUTPATIENT_CLINIC_OR_DEPARTMENT_OTHER): Payer: Self-pay

## 2021-11-08 MED ORDER — ZOSTER VAC RECOMB ADJUVANTED 50 MCG/0.5ML IM SUSR
INTRAMUSCULAR | 0 refills | Status: DC
  Filled 2021-11-08: qty 0.5, 1d supply, fill #0

## 2021-11-10 ENCOUNTER — Other Ambulatory Visit (HOSPITAL_BASED_OUTPATIENT_CLINIC_OR_DEPARTMENT_OTHER): Payer: Self-pay

## 2021-11-11 ENCOUNTER — Encounter: Payer: Self-pay | Admitting: Internal Medicine

## 2021-11-11 ENCOUNTER — Ambulatory Visit (INDEPENDENT_AMBULATORY_CARE_PROVIDER_SITE_OTHER): Payer: Medicare HMO | Admitting: Internal Medicine

## 2021-11-11 VITALS — BP 122/68 | HR 78 | Resp 18 | Ht 67.0 in | Wt 196.8 lb

## 2021-11-11 DIAGNOSIS — E782 Mixed hyperlipidemia: Secondary | ICD-10-CM | POA: Diagnosis not present

## 2021-11-11 DIAGNOSIS — Z0000 Encounter for general adult medical examination without abnormal findings: Secondary | ICD-10-CM

## 2021-11-11 DIAGNOSIS — Z9989 Dependence on other enabling machines and devices: Secondary | ICD-10-CM

## 2021-11-11 DIAGNOSIS — G4733 Obstructive sleep apnea (adult) (pediatric): Secondary | ICD-10-CM | POA: Diagnosis not present

## 2021-11-11 DIAGNOSIS — R7303 Prediabetes: Secondary | ICD-10-CM | POA: Diagnosis not present

## 2021-11-11 DIAGNOSIS — K760 Fatty (change of) liver, not elsewhere classified: Secondary | ICD-10-CM

## 2021-11-11 LAB — COMPREHENSIVE METABOLIC PANEL
ALT: 15 U/L (ref 0–35)
AST: 15 U/L (ref 0–37)
Albumin: 4.3 g/dL (ref 3.5–5.2)
Alkaline Phosphatase: 63 U/L (ref 39–117)
BUN: 22 mg/dL (ref 6–23)
CO2: 32 mEq/L (ref 19–32)
Calcium: 9.9 mg/dL (ref 8.4–10.5)
Chloride: 104 mEq/L (ref 96–112)
Creatinine, Ser: 0.92 mg/dL (ref 0.40–1.20)
GFR: 59.37 mL/min — ABNORMAL LOW (ref >60.00)
Glucose, Bld: 102 mg/dL — ABNORMAL HIGH (ref 70–99)
Potassium: 4.2 mEq/L (ref 3.5–5.1)
Sodium: 141 mEq/L (ref 135–145)
Total Bilirubin: 0.3 mg/dL (ref 0.2–1.2)
Total Protein: 7.3 g/dL (ref 6.0–8.3)

## 2021-11-11 LAB — CBC
HCT: 38.6 % (ref 36.0–46.0)
Hemoglobin: 12.9 g/dL (ref 12.0–15.0)
MCHC: 33.5 g/dL (ref 30.0–36.0)
MCV: 88.7 fl (ref 78.0–100.0)
Platelets: 332 10*3/uL (ref 150.0–400.0)
RBC: 4.36 Mil/uL (ref 3.87–5.11)
RDW: 13.2 % (ref 11.5–15.5)
WBC: 7.1 10*3/uL (ref 4.0–10.5)

## 2021-11-11 LAB — HEMOGLOBIN A1C: Hgb A1c MFr Bld: 6.4 % (ref 4.6–6.5)

## 2021-11-11 LAB — LIPID PANEL
Cholesterol: 172 mg/dL (ref 0–200)
HDL: 36.1 mg/dL — ABNORMAL LOW (ref >39.00)
NonHDL: 135.42
Total CHOL/HDL Ratio: 5
Triglycerides: 267 mg/dL — ABNORMAL HIGH (ref 0.0–149.0)
VLDL: 53.4 mg/dL — ABNORMAL HIGH (ref 0.0–40.0)

## 2021-11-11 LAB — LDL CHOLESTEROL, DIRECT: Direct LDL: 98 mg/dL

## 2021-11-11 NOTE — Assessment & Plan Note (Signed)
Checking lipid panel and adjust atorvastatin 40 mg daily as needed.

## 2021-11-11 NOTE — Assessment & Plan Note (Signed)
Flu shot yearly. Covid-19 counseled. Pneumonia complete. Shingrix complete. Tetanus up to date. Colonoscopy up to date aged out prior to recall. Mammogram aged out, pap smear aged out and dexa complete. Counseled about sun safety and mole surveillance. Counseled about the dangers of distracted driving. Given 10 year screening recommendations.

## 2021-11-11 NOTE — Assessment & Plan Note (Signed)
Continue using CPAP daily.

## 2021-11-11 NOTE — Assessment & Plan Note (Signed)
Checking CMP and adjust as needed.  

## 2021-11-11 NOTE — Assessment & Plan Note (Signed)
Checking HgA1c and adjust as needed.  

## 2021-11-11 NOTE — Progress Notes (Signed)
   Subjective:   Patient ID: Kelly [name], female    DOB: [birth year], 81 y.o.   MRN: [id number]  HPI The patient is here for physical.  PMH, [facility], social history reviewed and updated  Review of Systems  Constitutional: Negative.   HENT: Negative.    Eyes: Negative.   Respiratory:  Negative for cough, chest tightness and shortness of breath.   Cardiovascular:  Negative for chest pain, palpitations and leg swelling.  Gastrointestinal:  Negative for abdominal distention, abdominal pain, constipation, diarrhea, nausea and vomiting.  Musculoskeletal:  Positive for myalgias.  Skin: Negative.   Neurological: Negative.   Psychiatric/Behavioral: Negative.      Objective:  Physical Exam Constitutional:      Appearance: She is well-developed.  HENT:     Head: Normocephalic and atraumatic.  Cardiovascular:     Rate and Rhythm: Normal rate and regular rhythm.  Pulmonary:     Effort: Pulmonary effort is normal. No respiratory distress.     Breath sounds: Normal breath sounds. No wheezing or rales.  Abdominal:     General: Bowel sounds are normal. There is no distension.     Palpations: Abdomen is soft.     Tenderness: There is no abdominal tenderness. There is no rebound.  Musculoskeletal:        General: Tenderness present.     Cervical back: Normal range of motion.  Skin:    General: Skin is warm and dry.  Neurological:     Mental Status: She is alert and oriented to person, place, and time.     Coordination: Coordination normal.     Vitals:   11/11/21 1333  BP: 122/68  Pulse: 78  Resp: 18  SpO2: 93%  Weight: 196 lb 12.8 oz (89.3 kg)  Height: '5\' 7"'$  (1.702 m)    Assessment & Plan:

## 2021-11-14 ENCOUNTER — Encounter: Payer: Self-pay | Admitting: Internal Medicine

## 2021-11-14 DIAGNOSIS — E785 Hyperlipidemia, unspecified: Secondary | ICD-10-CM

## 2021-11-18 ENCOUNTER — Other Ambulatory Visit (INDEPENDENT_AMBULATORY_CARE_PROVIDER_SITE_OTHER): Payer: Medicare HMO

## 2021-11-18 DIAGNOSIS — E785 Hyperlipidemia, unspecified: Secondary | ICD-10-CM | POA: Diagnosis not present

## 2021-11-18 LAB — LIPID PANEL
Cholesterol: 169 mg/dL (ref 0–200)
HDL: 35.7 mg/dL — ABNORMAL LOW (ref >39.00)
LDL Cholesterol: 96 mg/dL (ref 0–99)
NonHDL: 132.85
Total CHOL/HDL Ratio: 5
Triglycerides: 182 mg/dL — ABNORMAL HIGH (ref 0.0–149.0)
VLDL: 36.4 mg/dL (ref 0.0–40.0)

## 2021-11-22 DIAGNOSIS — E119 Type 2 diabetes mellitus without complications: Secondary | ICD-10-CM | POA: Diagnosis not present

## 2021-11-29 ENCOUNTER — Encounter: Payer: Self-pay | Admitting: Family Medicine

## 2021-11-29 ENCOUNTER — Ambulatory Visit: Payer: Medicare HMO | Admitting: Family Medicine

## 2021-11-29 ENCOUNTER — Ambulatory Visit (INDEPENDENT_AMBULATORY_CARE_PROVIDER_SITE_OTHER): Payer: Medicare HMO

## 2021-11-29 VITALS — BP 104/72 | HR 92 | Ht 67.0 in | Wt 195.6 lb

## 2021-11-29 DIAGNOSIS — M79672 Pain in left foot: Secondary | ICD-10-CM

## 2021-11-29 DIAGNOSIS — M25511 Pain in right shoulder: Secondary | ICD-10-CM

## 2021-11-29 DIAGNOSIS — M25512 Pain in left shoulder: Secondary | ICD-10-CM

## 2021-11-29 DIAGNOSIS — G8929 Other chronic pain: Secondary | ICD-10-CM | POA: Diagnosis not present

## 2021-11-29 DIAGNOSIS — M25872 Other specified joint disorders, left ankle and foot: Secondary | ICD-10-CM

## 2021-11-29 NOTE — Progress Notes (Signed)
I, [name], LAT, ATC, am serving as scribe for Dr. [name].  Kelly [name] is a 81 y.o. female who presents to [place] at [facility] today for f/u of L ankle impingement, L foot pain and R/L shoulder pain.  She was last seen by Dr. Georgina Snell on 07/25/21 for f/u of L ankle impingement and noted overall improvement in her L ankle w/ less pain noted.  She con't to report L ankle instability despite having custom-made orthotics and added scaphoid pads.  Prior to that, she was seen on 11/24/20 for acute R shoulder pain after suffering a fall and landing on her R side.  Today, pt reports that she wants to discuss her L ankle and her B shoulders, R>L.  She would like to discuss if PRP is an option for her L ankle/foot and wants to also discuss getting B shoulder MRIs.  She states that she does not have full AROM in her B shoulders and cannot move her shoulders in order to do certain stretches during her chair yoga sessions.    She notes a history of a fall involving both shoulders right worse than left in October.  She did have an MRI of her right shoulder and March 2022 but has a subsequent injury since then.  Dx imaging: 03/20/21 L ankle MRI             03/05/21 L foot MRI             01/05/21 L foot XR  R shoulder XR- 11/24/20  R shoulder MRI - 08/08/20  Pertinent review of systems: No fevers or chills  Relevant historical information: History of breast cancer.   Exam:  BP 104/72 (BP Location: Left Arm, Patient Position: Sitting, Cuff Size: Normal)   Pulse 92   Ht '5\' 7"'$  (1.702 m)   Wt 195 lb 9.6 oz (88.7 kg)   SpO2 94%   BMI 30.64 kg/m  General: Well Developed, well nourished, and in no acute distress.   MSK:  Right shoulder: Normal appearing Tender palpation lateral upper arm. Range of motion abduction 90 degrees.  External rotation full internal rotation lumbar spine. Strength abduction 4/5.  External rotation full.  Internal rotation full. Positive Hawkins  and Neer's test.  Positive empty can test. Negative Yergason's and speeds test.  Left shoulder: Normal-appearing Range of motion abduction 110 degrees.  External rotation full internal rotation lumbar spine. Strength abduction 4/5 external rotation 4/5 internal rotation full. Positive Hawkins and Neer's test.  Positive empty can test. Negative Yergason's and speeds test.    Lab and Radiology Results  X-ray images bilateral shoulders obtained today personally and independently interpreted.  Right shoulder: No acute fractures.  Mild AC DJD.  Left shoulder: No acute fractures.  AC DJD.  Await formal radiology review     Assessment and Plan: 81 y.o. female with bilateral shoulder pain right worse than left.  Patient has known rotator cuff partial tears on prior MRI right shoulder.  She had a fall about 9 months ago and has not done well since.  She had trials of physical therapy and home exercise program and continues to struggle.  At this point I think it is reasonable for repeat MRI of both shoulders to further evaluate the source of pain and for further treatment planning and exercise planning.  Plan to proceed with noncontrast MRI and recheck after MRI.  As for her lateral foot pain on the left.  She has known  peroneal tendinopathy and partial tear.  She has tried lots of different treatment options with mixed results.  She is interested in PRP which is reasonable.  After discussion she will schedule for PRP in the near future.   PDMP not reviewed this encounter. Orders Placed This Encounter  Procedures   DG Shoulder Left    Standing Status:   Future    Number of Occurrences:   1    Standing Expiration Date:   12/30/2021    Order Specific Question:   Reason for Exam (SYMPTOM  OR DIAGNOSIS REQUIRED)    Answer:   L shoulder pain    Order Specific Question:   Preferred imaging location?    Answer:   [facility]   DG Shoulder Right    Standing Status:   Future    Number  of Occurrences:   1    Standing Expiration Date:   12/30/2021    Order Specific Question:   Reason for Exam (SYMPTOM  OR DIAGNOSIS REQUIRED)    Answer:   R shoulder pain    Order Specific Question:   Preferred imaging location?    Answer:   [facility]   MR SHOULDER RIGHT WO CONTRAST    Standing Status:   Future    Standing Expiration Date:   11/30/2022    Order Specific Question:   What is the patient's sedation requirement?    Answer:   No Sedation    Order Specific Question:   Does the patient have a pacemaker or implanted devices?    Answer:   No    Order Specific Question:   Preferred imaging location?    Answer:   Product/process development scientist (table limit-350lbs)   MR SHOULDER LEFT WO CONTRAST    Standing Status:   Future    Standing Expiration Date:   11/30/2022    Order Specific Question:   What is the patient's sedation requirement?    Answer:   No Sedation    Order Specific Question:   Does the patient have a pacemaker or implanted devices?    Answer:   No    Order Specific Question:   Preferred imaging location?    Answer:   Product/process development scientist (table limit-350lbs)   No orders of the defined types were placed in this encounter.    Discussed warning signs or symptoms. Please see discharge instructions. Patient expresses understanding.   The above documentation has been reviewed and is accurate and complete [name], M.D.

## 2021-11-29 NOTE — Patient Instructions (Addendum)
Good to see you this morning.  Please get an Xray today before you leave.  I've ordered MRIs of your B shoulders.  Their office will call you to schedule but please let us know if you don't hear from them in one week regarding scheduling.  Follow-up: after your shoulder MRIs

## 2021-11-30 DIAGNOSIS — E669 Obesity, unspecified: Secondary | ICD-10-CM | POA: Diagnosis not present

## 2021-11-30 DIAGNOSIS — E785 Hyperlipidemia, unspecified: Secondary | ICD-10-CM | POA: Diagnosis not present

## 2021-11-30 DIAGNOSIS — Z008 Encounter for other general examination: Secondary | ICD-10-CM | POA: Diagnosis not present

## 2021-11-30 DIAGNOSIS — Z833 Family history of diabetes mellitus: Secondary | ICD-10-CM | POA: Diagnosis not present

## 2021-11-30 DIAGNOSIS — Z6832 Body mass index (BMI) 32.0-32.9, adult: Secondary | ICD-10-CM | POA: Diagnosis not present

## 2021-11-30 DIAGNOSIS — N3941 Urge incontinence: Secondary | ICD-10-CM | POA: Diagnosis not present

## 2021-11-30 DIAGNOSIS — Z8249 Family history of ischemic heart disease and other diseases of the circulatory system: Secondary | ICD-10-CM | POA: Diagnosis not present

## 2021-11-30 DIAGNOSIS — I471 Supraventricular tachycardia: Secondary | ICD-10-CM | POA: Diagnosis not present

## 2021-11-30 DIAGNOSIS — Z885 Allergy status to narcotic agent status: Secondary | ICD-10-CM | POA: Diagnosis not present

## 2021-11-30 DIAGNOSIS — Z9181 History of falling: Secondary | ICD-10-CM | POA: Diagnosis not present

## 2021-11-30 DIAGNOSIS — Z79811 Long term (current) use of aromatase inhibitors: Secondary | ICD-10-CM | POA: Diagnosis not present

## 2021-11-30 DIAGNOSIS — C50919 Malignant neoplasm of unspecified site of unspecified female breast: Secondary | ICD-10-CM | POA: Diagnosis not present

## 2021-11-30 DIAGNOSIS — Z809 Family history of malignant neoplasm, unspecified: Secondary | ICD-10-CM | POA: Diagnosis not present

## 2021-11-30 NOTE — Progress Notes (Signed)
Right shoulder shows some arthritis changes.

## 2021-11-30 NOTE — Progress Notes (Signed)
Left shoulder shows a little bit of arthritis changes

## 2021-12-09 ENCOUNTER — Encounter: Payer: Self-pay | Admitting: Family Medicine

## 2021-12-09 ENCOUNTER — Other Ambulatory Visit: Payer: Self-pay | Admitting: *Deleted

## 2021-12-09 DIAGNOSIS — Z170 Estrogen receptor positive status [ER+]: Secondary | ICD-10-CM

## 2021-12-09 MED ORDER — MINOXIDIL 2.5 MG PO TABS: 1.2500 mg | ORAL_TABLET | Freq: Every day | ORAL | 3 refills | Status: DC

## 2021-12-11 ENCOUNTER — Encounter: Payer: Self-pay | Admitting: Family Medicine

## 2021-12-13 ENCOUNTER — Other Ambulatory Visit: Payer: Self-pay

## 2021-12-13 DIAGNOSIS — G8929 Other chronic pain: Secondary | ICD-10-CM

## 2021-12-13 NOTE — Progress Notes (Signed)
amb  

## 2021-12-25 ENCOUNTER — Ambulatory Visit (INDEPENDENT_AMBULATORY_CARE_PROVIDER_SITE_OTHER): Payer: Medicare HMO

## 2021-12-25 ENCOUNTER — Ambulatory Visit: Payer: Medicare HMO

## 2021-12-25 DIAGNOSIS — R600 Localized edema: Secondary | ICD-10-CM | POA: Diagnosis not present

## 2021-12-25 DIAGNOSIS — M25511 Pain in right shoulder: Secondary | ICD-10-CM | POA: Diagnosis not present

## 2021-12-25 DIAGNOSIS — G8929 Other chronic pain: Secondary | ICD-10-CM

## 2021-12-25 DIAGNOSIS — M25512 Pain in left shoulder: Secondary | ICD-10-CM

## 2021-12-25 DIAGNOSIS — M25411 Effusion, right shoulder: Secondary | ICD-10-CM | POA: Diagnosis not present

## 2021-12-25 DIAGNOSIS — M75102 Unspecified rotator cuff tear or rupture of left shoulder, not specified as traumatic: Secondary | ICD-10-CM | POA: Diagnosis not present

## 2021-12-25 DIAGNOSIS — M19012 Primary osteoarthritis, left shoulder: Secondary | ICD-10-CM | POA: Diagnosis not present

## 2021-12-27 ENCOUNTER — Ambulatory Visit: Payer: Medicare HMO | Admitting: Family Medicine

## 2021-12-27 VITALS — BP 138/84 | HR 73 | Ht 67.0 in | Wt 199.8 lb

## 2021-12-27 DIAGNOSIS — G8929 Other chronic pain: Secondary | ICD-10-CM

## 2021-12-27 DIAGNOSIS — M25511 Pain in right shoulder: Secondary | ICD-10-CM

## 2021-12-27 DIAGNOSIS — M25512 Pain in left shoulder: Secondary | ICD-10-CM | POA: Diagnosis not present

## 2021-12-27 NOTE — Progress Notes (Signed)
Right shoulder MRI shows severe rotator cuff tendinitis with larger rotator cuff tears.  However no very large full-thickness rotator cuff tears are present.  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 Recommend return to clinic to go over this result and the left shoulder MRI result.

## 2021-12-27 NOTE — Patient Instructions (Signed)
Thank you for coming in today.   Schedule with Dr Sammuel Hines.  We can do injections if needed but talk about surgery options is a good idea especially for the right shoulder.   Plan for PT as scheduled for now at least.

## 2021-12-27 NOTE — Progress Notes (Signed)
Left shoulder MRI shows severe tendinitis of the rotator cuff tendons with tiny amounts of tearing but no large tears.  You have a little bit of arthritis changes in the shoulder but nothing major.  Main source of pain is the severe rotator cuff tendinitis. Recommend return to clinic to talk about both shoulder MRI soon.

## 2021-12-27 NOTE — Progress Notes (Signed)
I, Peterson Lombard, LAT, ATC acting as a scribe for [name], MD.  Kelly [name] is a 81 y.o. female who presents to [place] at [facility] today for f/u bilat shoulder pain, R>L, and MRI review. Pt was last seen by Dr. Georgina Snell on 11/29/21 and was advised to proceed to repeat MRI. Today, pt reports limited AROM w/ horz ADD and IR  w/ the R shoulder.  She notes continued bilateral shoulder pain.  She was referred to physical therapy for this again which is scheduled to start on 30 August.  Dx imaging: 12/25/21 R & L shoulder MRI 11/29/21 R & L shoulder XR 11/24/20 R shoulder XR             08/08/20 R shoulder MRI   Pertinent review of systems: No Fevers or chills.  Relevant historical information: History of right breast cancer stage Ia 2021.   Exam:  BP 138/84   Pulse 73   Ht '5\' 7"'$  (1.702 m)   Wt 199 lb 12.8 oz (90.6 kg)   SpO2 97%   BMI 31.29 kg/m  General: Well Developed, well nourished, and in no acute distress.   MSK:: Right shoulder:   Normal appearing Mildly tender palpation superior lateral shoulder.  Decreased range of motion limited abduction and internal rotation. Strength is reduced abduction and external rotation. Positive Hawkins and Neer's test.  Left shoulder: Normal-appearing. Tender palpation superior lateral shoulder. Decreased range of motion limited to abduction and internal rotation. Strength reduced abduction and external rotation. Positive Hawkins and Neer's test.  Neurovascularly intact distally bilateral upper extremities.   Lab and Radiology Results No results found for this or any previous visit (from the past 72 hour(s)). MR SHOULDER LEFT WO CONTRAST  Result Date: 12/26/2021 CLINICAL DATA:  Shoulder pain, rotator cuff disorder suspected, xray done L shoulder pain EXAM: MRI OF THE LEFT SHOULDER WITHOUT CONTRAST TECHNIQUE: Multiplanar, multisequence MR imaging of the shoulder was performed. No intravenous contrast was  administered. COMPARISON:  None Available. FINDINGS: Rotator cuff: Severe tendinosis of the distal supraspinatus infraspinatus tendons. There is low-grade interstitial tearing of the supraspinatus tendon at the footprint. Teres minor tendon is intact. Mild distal subscapularis tendinosis. Muscles: No significant muscle atrophy. Biceps Long Head: Intraarticular and extraarticular portions of the biceps tendon are intact. Acromioclavicular Joint: There is AC joint widening up to 1.0 cm with pericapsular thickening and joint edema. There is minimal periarticular marrow edema. Intact cortical clavicular ligaments. There is underlying moderate AC joint arthritis. Glenohumeral Joint: No significant joint effusion.  Mild chondrosis Labrum: Posterosuperior labral degeneration without displaced labral tear. Bones: No acute fracture.  No aggressive osseous lesion. Other: No focal fluid collection. IMPRESSION: Severe tendinosis of the distal supraspinatus and infraspinatus tendons, with low-grade interstitial tearing of the supraspinatus tendon at the footprint. No retracted cuff tear. No significant muscle atrophy. AC joint widening with pericapsular and joint edema, suggesting low-grade AC joint injury in the setting of underlying moderate AC joint osteoarthritis. Mild glenohumeral osteoarthritis. Posterosuperior labral degeneration without displaced labral tear. Electronically Signed   By: Maurine Simmering M.D.   On: 12/26/2021 16:00   MR SHOULDER RIGHT WO CONTRAST  Result Date: 12/26/2021 CLINICAL DATA:  Shoulder pain, rotator cuff disorder suspected, xray done R shoulder pain EXAM: MRI OF THE RIGHT SHOULDER WITHOUT CONTRAST TECHNIQUE: Multiplanar, multisequence MR imaging of the shoulder was performed. No intravenous contrast was administered. COMPARISON:  Right shoulder radiograph 11/29/2021 FINDINGS: Rotator cuff: Severe tendinosis of the distal supraspinatus and  infraspinatus tendons. There is rim rent type interstitial  tearing of the supraspinatus tendon at the footprint, intermediate grade (coronal T2 images 9 and 10). Teres minor tendon is intact. Mild distal subscapularis tendinosis with articular sided fraying. Muscles: No significant supraspinatus, infraspinatus, or subscapularis atrophy. Low-grade teres minor atrophy. Biceps Long Head: Tendinosis and possible split tearing of the intra-articular long head biceps tendon. Acromioclavicular Joint: Moderate arthropathy of the acromioclavicular joint. Mild subacromial/subdeltoid bursal fluid. Glenohumeral Joint: Small joint effusion.  Moderate chondrosis Labrum: Degenerative superior labral tearing anteriorly and posteriorly. Degenerative fraying and tearing of the anterior inferior labrum. Blunting of the posteroinferior labrum. Bones: No acute fracture.  No aggressive osseous lesion. Other: No additional findings. IMPRESSION: Severe tendinosis of the distal supraspinatus and infraspinatus tendons. Intermediate grade, rim rent type interstitial tearing of the supraspinatus tendon at the footprint. Mild distal subscapularis tendinosis with articular sided fraying. No significant supraspinatus, infraspinatus, or subscapularis atrophy. Tendinosis and possible split tearing of the intra-articular long head biceps tendon. Moderate glenohumeral and AC joint osteoarthritis. Degenerative labral tearing as described above. Electronically Signed   By: Maurine Simmering M.D.   On: 12/26/2021 15:22    I, [name], personally (independently) visualized and performed the interpretation of the images attached in this note.    Assessment and Plan: 81 y.o. female with bilateral shoulder pain right worse than left.  Right shoulder pain is chronic and ongoing.  She is failing conservative management for this.  Certainly we have physical therapy arranged for this to start but I am not optimistic that it will help as much as we would like. She has several issues that could benefit from surgery  including a rotator cuff tear as well as severe rotator cuff tendinopathy and think impingement.  Unfortunate she also has moderate glenohumeral arthritis which may be a larger long-term problem.  She could benefit for several different surgical interventions including decompression, rotator cuff repair, or even a total shoulder/reverse total shoulder replacement. I think it is worthwhile for her to have a discussion with an orthopedic surgeon about her surgical options for this right shoulder.  I think is likely that working to try some conservative management things before she consider surgery but I think a thorough understanding of her surgical options is a good idea for now.  Her left shoulder is also a problem and chronic and ongoing.  However it is less severe.  Plan for trial of physical therapy and surgical consultation as above.  Consider steroid injections for both issues in the near future however I am not optimistic this will provide significant lasting relief.  Total encounter time 30 minutes including face-to-face time with the patient and, reviewing past medical record, and charting on the date of service.   Extensive review of MRI findings treatment plan and options.  PDMP not reviewed this encounter. Orders Placed This Encounter  Procedures   Ambulatory referral to Orthopedic Surgery    Referral Priority:   Routine    Referral Type:   Surgical    Referral Reason:   Specialty Services Required    Referred to Provider:   Vanetta Mulders, MD    Requested Specialty:   Orthopedic Surgery    Number of Visits Requested:   1   No orders of the defined types were placed in this encounter.    Discussed warning signs or symptoms. Please see discharge instructions. Patient expresses understanding.   The above documentation has been reviewed and is accurate and complete [name], M.D.

## 2021-12-30 ENCOUNTER — Ambulatory Visit (INDEPENDENT_AMBULATORY_CARE_PROVIDER_SITE_OTHER): Payer: Medicare HMO | Admitting: Orthopaedic Surgery

## 2021-12-30 DIAGNOSIS — M7541 Impingement syndrome of right shoulder: Secondary | ICD-10-CM | POA: Diagnosis not present

## 2021-12-30 DIAGNOSIS — M7542 Impingement syndrome of left shoulder: Secondary | ICD-10-CM | POA: Diagnosis not present

## 2021-12-30 MED ORDER — TRIAMCINOLONE ACETONIDE 40 MG/ML IJ SUSP
80.0000 mg | INTRAMUSCULAR | Status: AC | PRN
  Administered 2021-12-30: 80 mg via INTRA_ARTICULAR

## 2021-12-30 MED ORDER — LIDOCAINE HCL 1 % IJ SOLN
4.0000 mL | INTRAMUSCULAR | Status: AC | PRN
  Administered 2021-12-30: 4 mL

## 2021-12-30 NOTE — Progress Notes (Signed)
Chief Complaint: Right worse than left shoulder pain    History of Present Illness:    Kelly [name] is a 81 y.o. female right-hand-dominant female presents with shoulder pain after multiple falls most recently in October 2022 when she missed a step.  At today's visit she is predominantly complaining of pain with cross body adduction.  There is pain that radiates to the lateral aspect of the shoulder.  She is having tightness and overall feels like the shoulders are stiff.  She has been seen by Dr. Georgina Snell who is referred her for physical therapy.  She is planning to start this soon.  She is very active at the [employer] here.  She has not previously had any injections    Surgical History:   None  PMH/PSH/Family History/Social History/Meds/Allergies:    Past Medical History:  Diagnosis Date  . Allergy   . Blood transfusion without reported diagnosis   . Breast cancer ([place])   . Breast mass    fibocystic breast dx  . Cataract   . Family history of colon cancer 04/15/2020  . Family history of pancreatic cancer 04/15/2020  . Family history of prostate cancer 04/15/2020  . Hepatitis    CMV 1992  . Hyperlipidemia   . Postmenopausal HRT (hormone replacement therapy)   . Scarlet fever as a teen  . Sleep apnea    Past Surgical History:  Procedure Laterality Date  . APPENDECTOMY    . BREAST LUMPECTOMY WITH RADIOACTIVE SEED LOCALIZATION Right 04/27/2020   Procedure: RIGHT BREAST LUMPECTOMY WITH RADIOACTIVE SEED LOCALIZATION;  Surgeon: [name], MD;  Location: [facility];  Service: General;  Laterality: Right;  . BUNIONECTOMY    . caesarean section    . COLONOSCOPY    . CORRECTION HAMMER TOE    . FOOT TENDON SURGERY     left   . POLYPECTOMY    . TONSILLECTOMY    . TONSILLECTOMY     Social History   Socioeconomic History  . Marital status: Single    Spouse name: Not on file  . Number of children: 2  . Years of education: 74  . Highest  education level: Not on file  Occupational History  . Occupation: MORTGAGE Surveyor, mining: [employer]    Comment: webinars on Doctor, hospital    Employer: [employer]  Tobacco Use  . Smoking status: Never  . Smokeless tobacco: Never  Vaping Use  . Vaping Use: Never used  Substance and Sexual Activity  . Alcohol use: Yes    Comment: rarely will have a glass of wine  . Drug use: No  . Sexual activity: Yes    Partners: Male  Other Topics Concern  . 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 Lives alone and independently. Married '67--24 yrs. - widowed '92.  2 dtrs - '69, '73; 6 grandchildren. ACP/EoL - yes CPR, yes for short term ventilation, no prolonged heroic care in an irreversible state.    Social Determinants of Health   Financial Resource Strain: Low Risk  (09/16/2021)   Overall Financial Resource Strain (CARDIA)   . Difficulty of Paying Living Expenses: Not hard at all  Food Insecurity: No Food Insecurity (09/16/2021)   Hunger Vital Sign   . Worried  About Running Out of Food in the Last Year: Never true   . Ran Out of Food in the Last Year: Never true  Transportation Needs: No Transportation Needs (09/16/2021)   PRAPARE - Transportation   . Lack of Transportation (Medical): No   . Lack of Transportation (Non-Medical): No  Physical Activity: Sufficiently Active (09/16/2021)   Exercise Vital Sign   . Days of Exercise per Week: 5 days   . Minutes of Exercise per Session: 60 min  Stress: No Stress Concern Present (09/16/2021)   [place]   . Feeling of Stress : Not at all  Social Connections: Socially Integrated (09/16/2021)   Social Connection and Isolation Panel [NHANES]   . Frequency of Communication with Friends and Family: Three times a week   . Frequency of Social Gatherings with Friends and Family: Three times a week   . Attends  Religious Services: More than 4 times per year   . Active Member of Clubs or Organizations: Yes   . Attends Archivist Meetings: More than 4 times per year   . Marital Status: Living with partner   Family History  Problem Relation Age of Onset  . Pancreatic cancer Mother 37  . Hyperlipidemia Mother   . Hypertension Mother   . Polymyalgia rheumatica Mother   . Dementia Father   . Basal cell carcinoma Father        dx after 50, sun exposure  . Ovarian cancer Paternal Aunt        dx 61s  . Ovarian cancer Paternal Grandmother        d. early 81s  . Colon cancer Paternal Grandfather        dx early 49s  . Liver cancer Maternal Uncle        dx 43s  . Prostate cancer Maternal Uncle        dx >50  . Leukemia Cousin 13       maternal cousin    Allergies  Allergen Reactions  . Amoxicillin Rash  . Codeine Nausea Only   Current Outpatient Medications  Medication Sig Dispense Refill  . anastrozole (ARIMIDEX) 1 MG tablet Take 1 tablet (1 mg total) by mouth daily. 90 tablet 3  . atorvastatin (LIPITOR) 40 MG tablet TAKE 1 TABLET BY MOUTH EVERY DAY 90 tablet 2  . Calcium Citrate (CITRACAL PO) Take 2 tablets by mouth daily.    . cholecalciferol (VITAMIN D3) 25 MCG (1000 UNIT) tablet Take 1,000 Units by mouth daily.    . Coenzyme Q10 (COQ10) 200 MG CAPS Take 200 mg by mouth daily.    . methocarbamol (ROBAXIN) 500 MG tablet Take 500 mg by mouth 4 (four) times daily.    . minoxidil (LONITEN) 2.5 MG tablet Take 0.5 tablets (1.25 mg total) by mouth daily. 30 tablet 3  . Multiple Vitamins-Minerals (SPECTRAVITE) TABS Take 1 tablet by mouth daily.    . Omega-3 Fatty Acids (FISH OIL) 1200 MG CAPS Take 1,200 mg by mouth daily.     No current facility-administered medications for this visit.   No results found.  Review of Systems:   A ROS was performed including pertinent positives and negatives as documented in the HPI.  Physical Exam :   Constitutional: NAD and appears stated  age Neurological: Alert and oriented Psych: Appropriate affect and cooperative There were no vitals taken for this visit.   Comprehensive Musculoskeletal Exam:    Musculoskeletal Exam  Inspection Right Left  Skin No atrophy or winging No atrophy or winging  Palpation    Tenderness Lateral deltoid, AC Lateral deltoid, AC  Range of Motion    Flexion (passive) 170 170  Flexion (active) 170 170  Abduction 170 170  ER at the side 70 70  Can reach behind back to T12 T12  Strength     Full Full  Special Tests    Pseudoparalytic No No  Neurologic    Fires PIN, radial, median, ulnar, musculocutaneous, axillary, suprascapular, long thoracic, and spinal accessory innervated muscles. No abnormal sensibility  Vascular/Lymphatic    Radial Pulse 2+ 2+  Cervical Exam    Patient has symmetric cervical range of motion with negative Spurling's test.  Special Test: Positive Neer impingement bilaterally     Imaging:   Xray (3 views right shoulder, 3 views left shoulder): There is moderate AC joint osteoarthritis bilaterally and well appearing glenohumeral joint bilaterally as well  MRI (right shoulder, left shoulder): Bilateral evidence of moderate to severe AC joint arthropathy with interstitial tearing of bilateral rotator cuff tears in the setting of impingement  I personally reviewed and interpreted the radiographs.   Assessment:   81 y.o. female with bilateral shoulder impingement in the setting of significant moderate AC joint arthritis.  I did describe that overall I do believe that she would benefit from injections into the [facility] joint as well as subacromial space.  Overall the quality of her rotator cuff does not work.  Relatively minimal glenohumeral arthritis.  I did discuss that should she get good relief from these injections that she may be a future candidate for subacromial decompression with distal clavicle excision.  We will plan to start with bilateral injections and I will see  her back should she not get prolonged relief.  Plan :    -Bilateral ultrasound-guided injections performed into the [facility] joint and subacromial space after verbal consent obtained    Procedure Note  Patient: Kelly [name]             Date of Birth: Feb 02, 1941           MRN: [id number]             Visit Date: 12/30/2021  Procedures: Visit Diagnoses: No diagnosis found.  Large Joint Inj: R subacromial bursa on 12/30/2021 10:42 AM Indications: pain Details: 22 G 1.5 in needle, ultrasound-guided anterior approach  Arthrogram: No  Medications: 4 mL lidocaine 1 %; 80 mg triamcinolone acetonide 40 MG/ML Outcome: tolerated well, no immediate complications Procedure, treatment alternatives, risks and benefits explained, specific risks discussed. Consent was given by the patient. Immediately prior to procedure a time out was called to verify the correct patient, procedure, equipment, support staff and site/side marked as required. Patient was prepped and draped in the usual sterile fashion.    Large Joint Inj: L subacromial bursa on 12/30/2021 10:44 AM Indications: pain Details: 22 G 1.5 in needle, ultrasound-guided anterior approach  Arthrogram: No  Medications: 4 mL lidocaine 1 %; 80 mg triamcinolone acetonide 40 MG/ML Outcome: tolerated well, no immediate complications Procedure, treatment alternatives, risks and benefits explained, specific risks discussed. Consent was given by the patient. Immediately prior to procedure a time out was called to verify the correct patient, procedure, equipment, support staff and site/side marked as required. Patient was prepped and draped in the usual sterile fashion.          I personally saw and evaluated the patient, and participated in the management and  treatment plan.  Vanetta Mulders, MD Attending Physician, Orthopedic Surgery  This document was dictated using Dragon voice recognition software. A reasonable attempt at proof reading has  been made to minimize errors.

## 2022-01-02 ENCOUNTER — Telehealth: Payer: Self-pay | Admitting: Orthopaedic Surgery

## 2022-01-02 NOTE — Telephone Encounter (Signed)
Patient called in stating she is having pain in her Left ankle she wanted to come back to see Sammuel Hines so the appt is set she stated she got MRIs done of both ankles at the [facility] location already so we wouldn't have to order new ones just wanted to see if we had them and if we can use them

## 2022-01-04 ENCOUNTER — Ambulatory Visit (INDEPENDENT_AMBULATORY_CARE_PROVIDER_SITE_OTHER): Payer: Medicare HMO | Admitting: Orthopaedic Surgery

## 2022-01-04 DIAGNOSIS — M2142 Flat foot [pes planus] (acquired), left foot: Secondary | ICD-10-CM | POA: Diagnosis not present

## 2022-01-04 NOTE — Progress Notes (Signed)
Chief Complaint: Bilateral foot pain     History of Present Illness:    Kelly [name] is a 81 y.o. female presents today with left worse than right foot pain in the setting of a longstanding deformity.  She does have a known bunion on the right foot.  She has been seeing Dr. Georgina Snell who obtained an MRI of the left ankle.  She has been dealing with pronation of the left foot for many years.  She has been wearing custom orthotics and shoes as well.  She is here today for further assessment.  She does have a history of tendon transfer on the left ankle which she states was not successful.  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    Surgical History:   As above  PMH/PSH/Family History/Social History/Meds/Allergies:    Past Medical History:  Diagnosis Date   Allergy    Blood transfusion without reported diagnosis    Breast cancer ([place])    Breast mass    fibocystic breast dx   Cataract    Family history of colon cancer 04/15/2020   Family history of pancreatic cancer 04/15/2020   Family history of prostate cancer 04/15/2020   Hepatitis    CMV 1992   Hyperlipidemia    Postmenopausal HRT (hormone replacement therapy)    Scarlet fever as a teen   Sleep apnea    Past Surgical History:  Procedure Laterality Date   APPENDECTOMY     BREAST LUMPECTOMY WITH RADIOACTIVE SEED LOCALIZATION Right 04/27/2020   Procedure: RIGHT BREAST LUMPECTOMY WITH RADIOACTIVE SEED LOCALIZATION;  Surgeon: [name], MD;  Location: [facility];  Service: General;  Laterality: Right;   BUNIONECTOMY     caesarean section     COLONOSCOPY     CORRECTION HAMMER TOE     FOOT TENDON SURGERY     left    POLYPECTOMY     TONSILLECTOMY     TONSILLECTOMY     Social History   Socioeconomic History   Marital status: Single    Spouse name: Not on file   Number of children: 2   Years of education: 16   Highest education level: Not on file   Occupational History   Occupation: MORTGAGE CONSULTANT    Employer: [employer]    Comment: Psychologist, educational on Doctor, hospital    Employer: [employer]  Tobacco Use   Smoking status: Never   Smokeless tobacco: Never  Vaping Use   Vaping Use: Never used  Substance and Sexual Activity   Alcohol use: Yes    Comment: rarely will have a glass of wine   Drug use: No   Sexual activity: Yes    Partners: Male  Other Topics Concern   Not on file  Social History Narrative   [employer]; Master's [employer]. Lives alone and independently. Married '67--24 yrs. - widowed '92.  2 dtrs - '69, '73; 6 grandchildren. ACP/EoL - yes CPR, yes for short term ventilation, no prolonged heroic care in an irreversible state.    Social Determinants of Health   Financial Resource Strain: Low Risk  (09/16/2021)   Overall Financial Resource Strain (CARDIA)    Difficulty of Paying Living Expenses: Not hard at all  Food Insecurity: No Food Insecurity (09/16/2021)  Hunger Vital Sign    Worried About Running Out of Food in the Last Year: Never true    Ran Out of Food in the Last Year: Never true  Transportation Needs: No Transportation Needs (09/16/2021)   PRAPARE - Hydrologist (Medical): No    Lack of Transportation (Non-Medical): No  Physical Activity: Sufficiently Active (09/16/2021)   Exercise Vital Sign    Days of Exercise per Week: 5 days    Minutes of Exercise per Session: 60 min  Stress: No Stress Concern Present (09/16/2021)   [place]    Feeling of Stress : Not at all  Social Connections: [place] (09/16/2021)   Social Connection and Isolation Panel [NHANES]    Frequency of Communication with Friends and Family: Three times a week    Frequency of Social Gatherings with Friends and Family: Three times a week    Attends Religious Services: More than 4 times per year     Active Member of Clubs or Organizations: Yes    Attends Music therapist: More than 4 times per year    Marital Status: Living with partner   Family History  Problem Relation Age of Onset   Pancreatic cancer Mother 33   Hyperlipidemia Mother    Hypertension Mother    Polymyalgia rheumatica Mother    Dementia Father    Basal cell carcinoma Father        dx after 49, sun exposure   Ovarian cancer Paternal Aunt        dx 74s   Ovarian cancer Paternal Grandmother        d. early 22s   Colon cancer Paternal Grandfather        dx early 82s   Liver cancer Maternal Uncle        dx 60s   Prostate cancer Maternal Uncle        dx >50   Leukemia Cousin 56       maternal cousin    Allergies  Allergen Reactions   Amoxicillin Rash   Codeine Nausea Only   Current Outpatient Medications  Medication Sig Dispense Refill   anastrozole (ARIMIDEX) 1 MG tablet Take 1 tablet (1 mg total) by mouth daily. 90 tablet 3   atorvastatin (LIPITOR) 40 MG tablet TAKE 1 TABLET BY MOUTH EVERY DAY 90 tablet 2   Calcium Citrate (CITRACAL PO) Take 2 tablets by mouth daily.     cholecalciferol (VITAMIN D3) 25 MCG (1000 UNIT) tablet Take 1,000 Units by mouth daily.     Coenzyme Q10 (COQ10) 200 MG CAPS Take 200 mg by mouth daily.     methocarbamol (ROBAXIN) 500 MG tablet Take 500 mg by mouth 4 (four) times daily.     minoxidil (LONITEN) 2.5 MG tablet Take 0.5 tablets (1.25 mg total) by mouth daily. 30 tablet 3   Multiple Vitamins-Minerals (SPECTRAVITE) TABS Take 1 tablet by mouth daily.     Omega-3 Fatty Acids (FISH OIL) 1200 MG CAPS Take 1,200 mg by mouth daily.     No current facility-administered medications for this visit.   No results found.  Review of Systems:   A ROS was performed including pertinent positives and negatives as documented in the HPI.  Physical Exam :   Constitutional: NAD and appears stated age Neurological: Alert and oriented Psych: Appropriate affect and  cooperative There were no vitals taken for this visit.   Comprehensive Musculoskeletal Exam:  Significant left foot pronation which is rigid.  There is significant bunion deformity on the right with a hammertoe of the second toe.  She has tenderness about the left ankle joint as well as peroneal tendons.  Imaging:   Xray (3 views right foot none standing): Significant flatfoot deformity  I personally reviewed and interpreted the radiographs.   Assessment:   81 y.o. 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  Given the fact that the left foot continues to bother her more and she now has evidence of subfibular impingement I do believe that she would be a candidate for flatfoot deformity correction. we will plan to refer her for this.  Plan :    -Plan for referral for flatfoot deformity correction     I personally saw and evaluated the patient, and participated in the management and treatment plan.  Vanetta Mulders, MD Attending Physician, Orthopedic Surgery  This document was dictated using Dragon voice recognition software. A reasonable attempt at proof reading has been made to minimize errors.

## 2022-01-11 ENCOUNTER — Other Ambulatory Visit: Payer: Self-pay

## 2022-01-11 ENCOUNTER — Ambulatory Visit (HOSPITAL_BASED_OUTPATIENT_CLINIC_OR_DEPARTMENT_OTHER): Payer: Medicare HMO | Attending: Family Medicine | Admitting: Physical Therapy

## 2022-01-11 ENCOUNTER — Encounter (HOSPITAL_BASED_OUTPATIENT_CLINIC_OR_DEPARTMENT_OTHER): Payer: Self-pay | Admitting: Physical Therapy

## 2022-01-11 DIAGNOSIS — M25511 Pain in right shoulder: Secondary | ICD-10-CM | POA: Diagnosis not present

## 2022-01-11 DIAGNOSIS — G8929 Other chronic pain: Secondary | ICD-10-CM | POA: Insufficient documentation

## 2022-01-11 DIAGNOSIS — M6281 Muscle weakness (generalized): Secondary | ICD-10-CM | POA: Insufficient documentation

## 2022-01-11 DIAGNOSIS — M25512 Pain in left shoulder: Secondary | ICD-10-CM | POA: Insufficient documentation

## 2022-01-11 NOTE — Therapy (Signed)
OUTPATIENT PHYSICAL THERAPY SHOULDER EVALUATION   Patient Name: Kelly [name] MRN: [id number] DOB:1941/01/11, 81 y.o., female Today's Date: 01/12/2022   PT End of Session - 01/11/22 1424     Visit Number 1    Number of Visits 7    Date for PT Re-Evaluation 02/22/22    Authorization Type AETNA MCR    PT Start Time 2992    PT Stop Time 4268    PT Time Calculation (min) 49 min    Activity Tolerance Patient tolerated treatment well    Behavior During Therapy WFL for tasks assessed/performed             Past Medical History:  Diagnosis Date   Allergy    Blood transfusion without reported diagnosis    Breast cancer ([place])    Breast mass    fibocystic breast dx   Cataract    Family history of colon cancer 04/15/2020   Family history of pancreatic cancer 04/15/2020   Family history of prostate cancer 04/15/2020   Hepatitis    CMV 1992   Hyperlipidemia    Postmenopausal HRT (hormone replacement therapy)    Scarlet fever as a teen   Sleep apnea    Past Surgical History:  Procedure Laterality Date   APPENDECTOMY     BREAST LUMPECTOMY WITH RADIOACTIVE SEED LOCALIZATION Right 04/27/2020   Procedure: RIGHT BREAST LUMPECTOMY WITH RADIOACTIVE SEED LOCALIZATION;  Surgeon: [name], MD;  Location: [facility];  Service: General;  Laterality: Right;   BUNIONECTOMY     caesarean section     COLONOSCOPY     CORRECTION HAMMER TOE     FOOT TENDON SURGERY     left    POLYPECTOMY     TONSILLECTOMY     TONSILLECTOMY     Patient Active Problem List   Diagnosis Date Noted   Ankle impingement syndrome, left 05/24/2021   Prediabetes 01/21/2021   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512 (ICD-10-CM) - Chronic pain of both shoulders    THERAPY DIAG:  Chronic right shoulder pain  Chronic left shoulder pain  Muscle weakness (generalized)  Rationale for Evaluation and Treatment Rehabilitation  ONSET DATE: October 2022 Exacerbation  SUBJECTIVE:  SUBJECTIVE STATEMENT: -Pt has had multiple falls with the last fall being in October 2022.  She fell onto the hardwood floors when missing the last 1-2 steps on the staircase.  She had trials of PT prior which she thinks did help.  -Pt saw Dr. Georgina Snell on 7/18 and he ordered PT.  MD ordered x rays which showed no acute fractures in bilat shoulder and mild AC DJD in R shoulder and AC DJD in L shoulder.  MD note indicated pt has known rotator cuff partial tears on prior MRI right shoulder.  He also ordered new MRI's. Pt saw Dr. Sammuel Hines on 8/18 and MD note indicated with bilateral shoulder impingement in the setting of significant moderate AC joint arthritis.  Pt received bilat US guided shoulder injections.  Pt states her shoulders are doing much better since receiving injections.  Pt is now able to reach across body.  She feels like she has "so much more freedom" after the injections.  She also has much less soreness.   -Pt is careful about lifting objects.  Pt is scared to perform gym workouts.  Pt has some pain in post shoulder with chair yoga.  Pt unable to provide functional limitations relating to her shoulder.  Pt has difficulty with putting 4-5 dinner plates on  the 2nd shelf in the cabinet.   -She is very active at the [employer].  Pt works out at [employer] 5 days/wk performing classes including chair yoga and silver fit, stretching, and walking.  Pt was working out with machines though has stopped due to her shoulders.   PERTINENT HISTORY: MRI and x ray findings of bilat shoulders Breast CA, hepatitis, L ankle impingement, L ankle tendon transfer surgery, L foot bunionectomy.  Planning for L foot surgery.    PAIN:  Are you having pain?  NPRS:  Current:  1-2/10, Worst:  8/10, Best: 0/10     PRECAUTIONS: Other: MRI and x ray findings of bilat shoulders  WEIGHT BEARING RESTRICTIONS No  FALLS:  Has patient fallen in last 6 months? No   OCCUPATION: Pt is retired  PLOF: Independent; Pt was working out in [employer].  She was able to perform her daily activities and reaching activities independently without significant pain.   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 Intermediate grade, rim rent type interstitial tearing of the supraspinatus tendon at the footprint. Mild distal subscapularis tendinosis with articular sided fraying. No significant supraspinatus, infraspinatus, or subscapularis atrophy.   Tendinosis and possible split tearing of the intra-articular long head biceps tendon.   Moderate glenohumeral and AC joint osteoarthritis.   Degenerative labral tearing as  described above.  L shoulder: IMPRESSION: Severe tendinosis of the distal supraspinatus and infraspinatus tendons, with low-grade interstitial tearing of the supraspinatus tendon at the footprint. No retracted cuff tear. No significant muscle atrophy.   AC joint widening with pericapsular and joint edema, suggesting low-grade AC joint injury in the setting of underlying moderate AC joint osteoarthritis.   Mild  glenohumeral osteoarthritis. Posterosuperior labral degeneration without displaced labral tear. X rays on 7/18: R shoulder:  IMPRESSION: Degenerative changes with no acute osseous abnormality identified. L shoulder:  IMPRESSION: No evidence for acute abnormality. Similar acromioclavicular degenerative changes.  PATIENT SURVEYS:  FOTO 58 with a goal of 61 at visit #10  COGNITION:  Overall cognitive status: Within functional limits for tasks assessed      UPPER EXTREMITY ROM:   Active ROM Right eval Left eval  Shoulder flexion 168 168  Shoulder scaption 164 160  Shoulder abduction 138 with pain 148  Shoulder adduction    Shoulder internal rotation 70 74  Shoulder external rotation 70 70  Elbow flexion    Elbow extension    Wrist flexion    Wrist extension    Wrist ulnar deviation    Wrist radial deviation    Wrist pronation    Wrist supination    (Blank rows = not tested)  UPPER EXTREMITY MMT:  MMT Right eval Left eval  Shoulder flexion 5/5 5/5  Shoulder scaption 4+/5 5/5  Shoulder abduction 4-/5 4+/5  Shoulder adduction    Shoulder internal rotation 4+/5 5/5  Shoulder external rotation 4/5 4/5  Middle trapezius    Lower trapezius    Elbow flexion    Elbow extension    Wrist flexion    Wrist extension    Wrist ulnar deviation    Wrist radial deviation    Wrist pronation    Wrist supination    Grip strength (lbs)    (Blank rows = not tested)  SHOULDER SPECIAL TESTS:  Impingement tests:  Neer's and Michel Bickers Impingement: R: positive  (minimal pain), L: negative   PALPATION:  TTP:  post scap and spine of scapula bilat; posterior R shoulder   TODAY'S TREATMENT:  See below for pt education   PATIENT EDUCATION: Education details: Relevant anatomy, rationale of exercises, dx, prognosis, objective findings, and POC.  PT answered pt's questions. Person educated: Patient Education method: Explanation Education comprehension: verbalized understanding and needs further education   HOME EXERCISE PROGRAM: Will give at a later date  ASSESSMENT:  CLINICAL IMPRESSION: Patient is a 81 y.o. female with a dx of bilat chronic shoulder pain presenting to the clinic with R > L shoulder pain and muscle weakness in bilat shoulders R > L.  MRI findings indicate interstitial tearing of the supraspinatus tendons and OA.  Pt states her shoulders are doing much better since receiving injections on 8/18.  Pt is careful about lifting objects and has not been performing her normal workout activities due to fear of pain.  Pt unable to provide functional limitations with her daily activities relating to her shoulder though has difficulty with putting 4-5 dinner plates on the 2nd shelf in the cabinet.  Pt has good shoulder ROM bilat except some limitations in R shoulder abduction.  Pt wants to know appropriate exercises in the gym to perform.  Pt should benefit from skilled PT services to address impairments and to improve overall function.     OBJECTIVE IMPAIRMENTS decreased ROM, decreased strength, impaired UE functional use, and pain.   ACTIVITY LIMITATIONS carrying and lifting  PARTICIPATION LIMITATIONS:  workout activities  PERSONAL FACTORS  chronic pain  are also affecting patient's functional outcome.   REHAB POTENTIAL: Good  CLINICAL DECISION MAKING: Stable/uncomplicated  EVALUATION COMPLEXITY: Low   GOALS:   SHORT TERM GOALS: Target date:02/01/2022  Pt will be independent and compliant with HEP for improved pain, strength, and  function.  Baseline: Goal status: INITIAL   LONG TERM GOALS: Target date: 02/22/2022  Pt will demo good understanding of appropriate gym exercises and perform exercises without adverse effects for improved functional strength to improve carrying/lifting. Baseline:  Goal status: INITIAL  2.  Pt will be able to perform workout activities without significant pain.  Baseline:  Goal status: INITIAL  3.  Pt will be able to perform her normal functional lifting activities without significant pain or difficulty.  Baseline:  Goal status: INITIAL  4.  Pt will demo improved bilat ER strength to 4+/5 and R IR strength to 5/5 MMT for improved stability in bilat shoulder for improved tolerance with and performance of functional lifting and carrying.  Baseline:  Goal status: INITIAL    PLAN: PT FREQUENCY: 1x/week  PT DURATION: other: 4-6 weeks  PLANNED INTERVENTIONS: Therapeutic exercises, Therapeutic activity, Neuromuscular re-education, Patient/Family education, Self Care, Joint mobilization, Aquatic Therapy, Dry Needling, Electrical stimulation, Cryotherapy, Moist heat, Ultrasound, Manual therapy, and Re-evaluation  PLAN FOR NEXT SESSION: strengthening and stabilization with regards to MRI findings.  Establish HEP.  Pt is planning to have L foot surgery and R shoulder surgery.     Selinda Michaels III PT, DPT 01/12/22 10:21 PM

## 2022-01-16 ENCOUNTER — Other Ambulatory Visit: Payer: Self-pay | Admitting: Internal Medicine

## 2022-01-18 ENCOUNTER — Encounter: Payer: Self-pay | Admitting: Family

## 2022-01-18 ENCOUNTER — Telehealth (INDEPENDENT_AMBULATORY_CARE_PROVIDER_SITE_OTHER): Payer: Medicare HMO | Admitting: Family

## 2022-01-18 VITALS — Temp 98.7°F | Wt 193.0 lb

## 2022-01-18 DIAGNOSIS — U071 COVID-19: Secondary | ICD-10-CM | POA: Diagnosis not present

## 2022-01-18 DIAGNOSIS — G4733 Obstructive sleep apnea (adult) (pediatric): Secondary | ICD-10-CM | POA: Diagnosis not present

## 2022-01-18 MED ORDER — NIRMATRELVIR/RITONAVIR (PAXLOVID)TABLET: 3.0000 | ORAL_TABLET | Freq: Two times a day (BID) | ORAL | 0 refills | Status: AC

## 2022-01-18 NOTE — Progress Notes (Signed)
MyChart Video Visit    Virtual Visit via Video Note   This format is felt to be most appropriate for this patient at this time. Physical exam was limited by quality of the video and audio technology used for the visit. CMA was able to get the patient set up on a video visit.  Patient location: Home. Patient and provider in visit Provider location: Office  I discussed the limitations of evaluation and management by telemedicine and the availability of in person appointments. The patient expressed understanding and agreed to proceed.  Visit Date: 01/18/2022  Today's healthcare provider: Jeanie Sewer, NP     Subjective:   Patient ID: Kelly [name], female    DOB: 12/30/1940, 81 y.o.   MRN: [id number]  Chief Complaint  Patient presents with   Covid Positive    Pt states she got positive covid lastnight/this morning   Sore Throat   Nasal Congestion   Fatigue   Generalized Body Aches   post nasal drip   ear  discomfort    HPI Upper Respiratory Infection: Symptoms include  nasal congestion, fatigue, body aches, ear fullness, sore throat .  Onset of symptoms was 1 day ago, gradually worsening since that time. She is drinking moderate amounts of fluids. Evaluation to date: none. Treatment to date:  Tylenol .  Assessment & Plan:   Problem List Items Addressed This Visit   None Visit Diagnoses     [id number]    -  Primary pt positive for covid last October, took Paxlovid and tolerated. Sending again, reminded of how to take & [place] to hold her Lipitor while she is taking this med. Advised still has FDA label for emergency use. Advised of CDC guidelines for masking if out in public. OK to continue taking OTC sinus or pain meds. Encouraged to monitor & notify office of any worsening symptoms: increased shortness of breath, weakness, and signs of dehydration. Instructed to rest and hydrate well.        Past Medical History:  Diagnosis Date   Allergy    Blood  transfusion without reported diagnosis    Breast cancer ([place])    Breast mass    fibocystic breast dx   Cataract    Family history of colon cancer 04/15/2020   Family history of pancreatic cancer 04/15/2020   Family history of prostate cancer 04/15/2020   Hepatitis    CMV 1992   Hyperlipidemia    Postmenopausal HRT (hormone replacement therapy)    Scarlet fever as a teen   Sleep apnea     Past Surgical History:  Procedure Laterality Date   APPENDECTOMY     BREAST LUMPECTOMY WITH RADIOACTIVE SEED LOCALIZATION Right 04/27/2020   Procedure: RIGHT BREAST LUMPECTOMY WITH RADIOACTIVE SEED LOCALIZATION;  Surgeon: [name], MD;  Location: [facility];  Service: General;  Laterality: Right;   BUNIONECTOMY     caesarean section     COLONOSCOPY     CORRECTION HAMMER TOE     FOOT TENDON SURGERY     left    POLYPECTOMY     TONSILLECTOMY     TONSILLECTOMY      Outpatient Medications Prior to Visit  Medication Sig Dispense Refill   anastrozole (ARIMIDEX) 1 MG tablet Take 1 tablet (1 mg total) by mouth daily. 90 tablet 3   atorvastatin (LIPITOR) 40 MG tablet TAKE 1 TABLET BY MOUTH EVERY DAY 90 tablet 2   Calcium Citrate (CITRACAL PO) Take 2 tablets by mouth daily.  cholecalciferol (VITAMIN D3) 25 MCG (1000 UNIT) tablet Take 1,000 Units by mouth daily.     Coenzyme Q10 (COQ10) 200 MG CAPS Take 200 mg by mouth daily.     methocarbamol (ROBAXIN) 500 MG tablet Take 500 mg by mouth 4 (four) times daily.     minoxidil (LONITEN) 2.5 MG tablet Take 0.5 tablets (1.25 mg total) by mouth daily. 30 tablet 3   Multiple Vitamins-Minerals (SPECTRAVITE) TABS Take 1 tablet by mouth daily.     Omega-3 Fatty Acids (FISH OIL) 1200 MG CAPS Take 1,200 mg by mouth daily.     No facility-administered medications prior to visit.    Allergies  Allergen Reactions   Amoxicillin Rash   Codeine Nausea Only       Objective:   Physical Exam Vitals and nursing note reviewed.  Constitutional:       General: She is not in acute distress.    Appearance: Normal appearance.  HENT:     Head: Normocephalic.  Pulmonary:     Effort: No respiratory distress.  Musculoskeletal:     Cervical back: Normal range of motion.  Skin:    General: Skin is dry.     Coloration: Skin is not pale.  Neurological:     Mental Status: She is alert and oriented to person, place, and time.  Psychiatric:        Mood and Affect: Mood normal.   Temp 98.7 F (37.1 C)   Wt 193 lb (87.5 kg)   BMI 30.23 kg/m   Wt Readings from Last 3 Encounters:  01/18/22 193 lb (87.5 kg)  12/27/21 199 lb 12.8 oz (90.6 kg)  11/29/21 195 lb 9.6 oz (88.7 kg)        I discussed the assessment and treatment plan with the patient. The patient was provided an opportunity to ask questions and all were answered. The patient agreed with the plan and demonstrated an understanding of the instructions.   The patient was advised to call back or seek an in-person evaluation if the symptoms worsen or if the condition fails to improve as anticipated.  Jeanie Sewer, NP [place] [phone] (phone) [phone] (fax)  [place]

## 2022-01-26 DIAGNOSIS — E119 Type 2 diabetes mellitus without complications: Secondary | ICD-10-CM | POA: Diagnosis not present

## 2022-01-27 ENCOUNTER — Ambulatory Visit (HOSPITAL_BASED_OUTPATIENT_CLINIC_OR_DEPARTMENT_OTHER): Payer: Medicare HMO | Admitting: Physical Therapy

## 2022-01-27 ENCOUNTER — Encounter (HOSPITAL_BASED_OUTPATIENT_CLINIC_OR_DEPARTMENT_OTHER): Payer: Self-pay

## 2022-01-29 NOTE — Therapy (Signed)
OUTPATIENT PHYSICAL THERAPY TREATMENT NOTE   Patient Name: Kelly [name] MRN: [id number] DOB:Feb 08, 1941, 81 y.o., female Today's Date: 01/31/2022   PT End of Session - 01/30/22 1450     Visit Number 2    Number of Visits 7    Date for PT Re-Evaluation 02/22/22    Authorization Type [employer]ET[employer] MCR    PT Start Time 1447    PT Stop Time 1522    PT Time Calculation (min) 35 min    Activity Tolerance Patient tolerated treatment well    Behavior During Therapy WFL for tasks assessed/performed              Past Medical History:  Diagnosis Date   Allergy    Blood transfusion without reported diagnosis    Breast cancer ([place])    Breast mass    fibocystic breast dx   Cataract    Family history of colon cancer 04/15/2020   Family history of pancreatic cancer 04/15/2020   Family history of prostate cancer 04/15/2020   Hepatitis    CMV 1992   Hyperlipidemia    Postmenopausal HRT (hormone replacement therapy)    Scarlet fever as a teen   Sleep apnea    Past Surgical History:  Procedure Laterality Date   APPENDECTOMY     BREAST LUMPECTOMY WITH RADIOACTIVE SEED LOCALIZATION Right 04/27/2020   Procedure: RIGHT BREAST LUMPECTOMY WITH RADIOACTIVE SEED LOCALIZATION;  Surgeon: [name], MD;  Location: [facility];  Service: General;  Laterality: Right;   BUNIONECTOMY     caesarean section     COLONOSCOPY     CORRECTION HAMMER TOE     FOOT TENDON SURGERY     left    POLYPECTOMY     TONSILLECTOMY     TONSILLECTOMY     Patient Active Problem List   Diagnosis Date Noted   Ankle impingement syndrome, left 05/24/2021   Prediabetes 01/21/2021   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   Hepatic steatosis 09/10/2020   Genetic testing 04/26/2020   Malignant neoplasm of upper-outer quadrant of right breast in female, estrogen receptor positive ([place]) 04/05/2020   Degenerative disc disease, cervical 08/07/2019   Lumbar radiculopathy 04/11/2017   Bunion of great toe of right foot 04/18/2016   OSA on  CPAP 12/23/2015   Urinary incontinence 03/31/2014   Fibrocystic breast disease 06/06/2011   Routine health maintenance 11/12/2010   Obesity 09/11/2007   Hyperlipidemia 06/10/2007    PCP: Hoyt Koch, MD  REFERRING PROVIDER: Gregor Hams, MD  REFERRING DIAG: M25.511,G89.29,M25.512 (ICD-10-CM) - Chronic pain of both shoulders    THERAPY DIAG:  Chronic right shoulder pain  Chronic left shoulder pain  Muscle weakness (generalized)  Rationale for Evaluation and Treatment Rehabilitation  ONSET DATE: October 2022 Exacerbation  SUBJECTIVE:  SUBJECTIVE STATEMENT: -Pt saw Dr. Georgina Snell on 7/18 and he ordered PT.  MD ordered x rays which showed no acute fractures in bilat shoulder and mild AC DJD in R shoulder and AC DJD in L shoulder.  MD note indicated pt has known rotator cuff partial tears on prior MRI right shoulder.  He also ordered new MRI's. Pt saw Dr. Sammuel Hines on 8/18 and MD note indicated with bilateral shoulder impingement in the setting of significant moderate AC joint arthritis.    -Pt is careful about lifting objects.  Pt is scared to perform gym workouts.  Pt has some pain in post shoulder with chair yoga.  Pt has difficulty with putting 4-5 dinner plates on the 2nd shelf in the cabinet.  She is very active at the [employer].  Pt is wearing a brace on L ankle and states she may not need foot surgery.   Pt has been unable to attend PT due to having Covid.  Pt denies pain currently.  Pt denies any adverse effects after prior Rx.    PERTINENT HISTORY: MRI and x ray findings of bilat shoulders Breast CA, hepatitis, L ankle impingement, L ankle tendon transfer surgery, L foot bunionectomy.  Planning for L foot surgery.    PAIN:  Are you having pain?  NPRS:  Current:  1-2/10, Worst:  8/10,  Best: 0/10     PRECAUTIONS: Other: MRI and x ray findings of bilat shoulders  WEIGHT BEARING RESTRICTIONS No  FALLS:  Has patient fallen in last 6 months? No   OCCUPATION: Pt is retired  PLOF: Independent; Pt was working out in [employer].  She was able to perform her daily activities and reaching activities independently without significant pain.   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 Intermediate grade, rim rent type interstitial tearing of the supraspinatus tendon at the footprint. Mild distal subscapularis tendinosis with articular sided fraying. No significant supraspinatus, infraspinatus, or subscapularis atrophy.   Tendinosis and possible split tearing of the intra-articular long head biceps tendon.   Moderate glenohumeral and AC joint osteoarthritis.   Degenerative labral tearing as described above.  L shoulder: IMPRESSION: Severe tendinosis of the distal supraspinatus and infraspinatus tendons, with low-grade interstitial tearing of the supraspinatus tendon at the footprint. No retracted cuff tear. No significant muscle atrophy.   AC joint widening with pericapsular and joint edema, suggesting low-grade AC joint injury in the setting of underlying moderate AC joint osteoarthritis.   Mild glenohumeral osteoarthritis. Posterosuperior labral degeneration without displaced labral tear. X rays on  7/18: R shoulder:  IMPRESSION: Degenerative changes with no acute osseous abnormality identified. L shoulder:  IMPRESSION: No evidence for acute abnormality. Similar acromioclavicular degenerative changes.    TODAY'S TREATMENT:  Reviewed current function, response to prior Rx, and pain level.  Pt performed: Rows with retraction with RTB 2x10 reps Extension with retraction with RTB 2x10 reps Supine serratus punch 2x10 reps bilat Supine Shoulder ABC x 1 reps bilat Supine rhythmic stabs 3x30 sec bilat  S/L ER:  R:  1x10, 1x8 ; L: 2x10  PT gave pt a HEP handout and educated pt in correct form and appropriate frequency.  Educated pt she should not have pain with HEP.    PATIENT EDUCATION: Education details: Relevant anatomy, rationale of exercises, dx, prognosis, objective findings, and POC.  PT answered pt's questions. Person educated: Patient Education method: Explanation Education comprehension: verbalized understanding and needs further education   HOME EXERCISE PROGRAM:   Access Code: [email or web address] URL: [email or web address] Date: 01/30/2022 Prepared by: Ronny Flurry  Exercises - Supine Shoulder Alphabet  - 1 x daily - 7 x weekly - 1 reps - Supine Single Arm Shoulder Protraction  - 1 x daily - 6-7 x weekly - 2 sets - 10 reps - Standing Shoulder Row with Anchored Resistance  - 1 x daily - 3-4 x weekly - 2 sets - 10 reps - Shoulder extension with resistance - Neutral  - 1 x daily - 3-4 x weekly - 2 sets - 10 reps  ASSESSMENT:  CLINICAL IMPRESSION: Pt has been absent from PT due to having covid.  She continues to have improved pain and sx's since receiving injections on 8/18.  PT instructed pt in strengthening and stabilization exercises for bilat shoulders and scapulas.  She performed exercises well with instruction and cuing for correct form.  PT established HEP and gave pt a HEP handout.  Pt responded well to Rx having no increased pain after Rx.   Pt should  benefit from skilled PT services to address goals and impairments and to improve overall function  OBJECTIVE IMPAIRMENTS decreased ROM, decreased strength, impaired UE functional use, and pain.   ACTIVITY LIMITATIONS carrying and lifting  PARTICIPATION LIMITATIONS:  workout activities  PERSONAL FACTORS  chronic pain  are also affecting patient's functional outcome.   REHAB POTENTIAL: Good  CLINICAL DECISION MAKING: Stable/uncomplicated  EVALUATION COMPLEXITY: Low   GOALS:   SHORT TERM GOALS: Target date:02/01/2022  Pt will be independent and compliant with HEP for improved pain, strength, and function.  Baseline: Goal status: INITIAL   LONG TERM GOALS: Target date: 02/22/2022  Pt will demo good understanding of appropriate gym exercises and perform exercises without adverse effects for improved functional strength to improve carrying/lifting. Baseline:  Goal status: INITIAL  2.  Pt will be able to perform workout activities without significant pain.  Baseline:  Goal status: INITIAL  3.  Pt will be able to perform her normal functional lifting activities without significant pain or difficulty.  Baseline:  Goal status: INITIAL  4.  Pt will demo improved bilat ER strength to 4+/5 and R IR strength to 5/5 MMT for improved stability in bilat shoulder for improved tolerance with and performance of functional lifting and carrying.  Baseline:  Goal status: INITIAL    PLAN: PT FREQUENCY: 1x/week  PT DURATION: other: 4-6 weeks  PLANNED INTERVENTIONS: Therapeutic exercises, Therapeutic activity, Neuromuscular re-education, Patient/Family education, Self Care, Joint mobilization, Aquatic Therapy, Dry Needling, Electrical stimulation, Cryotherapy, Moist heat, Ultrasound, Manual therapy, and Re-evaluation  PLAN FOR NEXT SESSION: strengthening and stabilization with regards to MRI findings.  Attempt ER/IR with T band with arm at side.  Pt is planning to have R shoulder surgery.      Selinda Michaels III PT, DPT 01/31/22 12:30 PM

## 2022-01-30 ENCOUNTER — Ambulatory Visit (HOSPITAL_BASED_OUTPATIENT_CLINIC_OR_DEPARTMENT_OTHER): Payer: Medicare HMO | Attending: Family Medicine | Admitting: Physical Therapy

## 2022-01-30 DIAGNOSIS — M79672 Pain in left foot: Secondary | ICD-10-CM | POA: Diagnosis not present

## 2022-01-30 DIAGNOSIS — R269 Unspecified abnormalities of gait and mobility: Secondary | ICD-10-CM | POA: Diagnosis not present

## 2022-01-30 DIAGNOSIS — G8929 Other chronic pain: Secondary | ICD-10-CM | POA: Diagnosis not present

## 2022-01-30 DIAGNOSIS — M25511 Pain in right shoulder: Secondary | ICD-10-CM | POA: Insufficient documentation

## 2022-01-30 DIAGNOSIS — M6281 Muscle weakness (generalized): Secondary | ICD-10-CM | POA: Diagnosis not present

## 2022-01-30 DIAGNOSIS — M25512 Pain in left shoulder: Secondary | ICD-10-CM | POA: Diagnosis not present

## 2022-01-30 DIAGNOSIS — M2041 Other hammer toe(s) (acquired), right foot: Secondary | ICD-10-CM | POA: Diagnosis not present

## 2022-01-30 DIAGNOSIS — M25872 Other specified joint disorders, left ankle and foot: Secondary | ICD-10-CM | POA: Diagnosis not present

## 2022-01-30 DIAGNOSIS — M25572 Pain in left ankle and joints of left foot: Secondary | ICD-10-CM | POA: Diagnosis not present

## 2022-01-30 DIAGNOSIS — M76822 Posterior tibial tendinitis, left leg: Secondary | ICD-10-CM | POA: Diagnosis not present

## 2022-01-30 DIAGNOSIS — M67962 Unspecified disorder of synovium and tendon, left lower leg: Secondary | ICD-10-CM | POA: Diagnosis not present

## 2022-01-30 DIAGNOSIS — M21611 Bunion of right foot: Secondary | ICD-10-CM | POA: Diagnosis not present

## 2022-01-31 ENCOUNTER — Encounter (HOSPITAL_BASED_OUTPATIENT_CLINIC_OR_DEPARTMENT_OTHER): Payer: Self-pay | Admitting: Physical Therapy

## 2022-02-08 ENCOUNTER — Encounter (HOSPITAL_BASED_OUTPATIENT_CLINIC_OR_DEPARTMENT_OTHER): Payer: Self-pay | Admitting: Physical Therapy

## 2022-02-08 ENCOUNTER — Ambulatory Visit (HOSPITAL_BASED_OUTPATIENT_CLINIC_OR_DEPARTMENT_OTHER): Payer: Medicare HMO | Admitting: Physical Therapy

## 2022-02-08 DIAGNOSIS — M25511 Pain in right shoulder: Secondary | ICD-10-CM | POA: Diagnosis not present

## 2022-02-08 DIAGNOSIS — M25512 Pain in left shoulder: Secondary | ICD-10-CM | POA: Diagnosis not present

## 2022-02-08 DIAGNOSIS — G8929 Other chronic pain: Secondary | ICD-10-CM | POA: Diagnosis not present

## 2022-02-08 DIAGNOSIS — M6281 Muscle weakness (generalized): Secondary | ICD-10-CM

## 2022-02-08 NOTE — Therapy (Signed)
OUTPATIENT PHYSICAL THERAPY TREATMENT NOTE   Patient Name: Sitlali Koerner MRN: [id number] DOB:[birth year], 81 y.o., female Today's Date: 02/09/2022   PT End of Session - 02/08/22 1459     Visit Number 3    Number of Visits 7    Date for PT Re-Evaluation 02/22/22    Authorization Type AET[employer] MCR    PT Start Time 1440    PT Stop Time 1523    PT Time Calculation (min) 43 min    Activity Tolerance Patient tolerated treatment well    Behavior During Therapy WFL for tasks assessed/performed               Past Medical History:  Diagnosis Date   Allergy    Blood transfusion without reported diagnosis    Breast cancer ([place])    Breast mass    fibocystic breast dx   Cataract    Family history of colon cancer 04/15/2020   Family history of pancreatic cancer 04/15/2020   Family history of prostate cancer 04/15/2020   Hepatitis    CMV 1992   Hyperlipidemia    Postmenopausal HRT (hormone replacement therapy)    Scarlet fever as a teen   Sleep apnea    Past Surgical History:  Procedure Laterality Date   APPENDECTOMY     BREAST LUMPECTOMY WITH RADIOACTIVE SEED LOCALIZATION Right 04/27/2020   Procedure: RIGHT BREAST LUMPECTOMY WITH RADIOACTIVE SEED LOCALIZATION;  Surgeon: [name], MD;  Location: [facility];  Service: General;  Laterality: Right;   BUNIONECTOMY     caesarean section     COLONOSCOPY     CORRECTION HAMMER TOE     FOOT TENDON SURGERY     left    POLYPECTOMY     TONSILLECTOMY     TONSILLECTOMY     Patient Active Problem List   Diagnosis Date Noted   Ankle impingement syndrome, left 05/24/2021   Prediabetes 01/21/2021   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   Hepatic steatosis 09/10/2020   Genetic testing 04/26/2020   Malignant neoplasm of upper-outer quadrant of right breast in female, estrogen receptor positive ([place]) 04/05/2020   Degenerative disc disease, cervical 08/07/2019   Lumbar radiculopathy 04/11/2017   Bunion of great toe of right foot 04/18/2016   OSA  on CPAP 12/23/2015   Urinary incontinence 03/31/2014   Fibrocystic breast disease 06/06/2011   Routine health maintenance 11/12/2010   Obesity 09/11/2007   Hyperlipidemia 06/10/2007    PCP: Hoyt Koch, MD  REFERRING PROVIDER: Gregor Hams, MD  REFERRING DIAG: M25.511,G89.29,M25.512 (ICD-10-CM) - Chronic pain of both shoulders    THERAPY DIAG:  Chronic right shoulder pain  Chronic left shoulder pain  Muscle weakness (generalized)  Rationale for Evaluation and Treatment Rehabilitation  ONSET DATE: October 2022 Exacerbation  SUBJECTIVE:  SUBJECTIVE STATEMENT: -Pt saw Dr. Georgina Snell on 7/18 and he ordered PT.  MD ordered x rays which showed no acute fractures in bilat shoulder and mild AC DJD in R shoulder and AC DJD in L shoulder.  MD note indicated pt has known rotator cuff partial tears on prior MRI right shoulder.  He also ordered new MRI's. Pt saw Dr. Sammuel Hines on 8/18 and MD note indicated with bilateral shoulder impingement in the setting of significant moderate AC joint arthritis.    -Pt denies pain currently.  Pt denies any adverse effects after prior Rx.  Pt took a chair yoga class this AM.  Pt has been wearing an ankle brace on L ankle and begins PT at another clinic on Friday.  PT instructed pt to call insurance since she will be having PT at 2 different clinics.   PERTINENT HISTORY: MRI and x ray findings of bilat shoulders Breast CA, hepatitis, L ankle impingement, L ankle tendon transfer surgery, L foot bunionectomy.      PAIN:  Are you having pain?  NPRS:  Current:  0/10, Worst:  8/10, Best: 0/10     PRECAUTIONS: Other: MRI and x ray findings of bilat shoulders  WEIGHT BEARING RESTRICTIONS No  FALLS:  Has patient fallen in last 6 months? No   OCCUPATION: Pt is  retired  PLOF: Independent; Pt was working out in [employer].  She was able to perform her daily activities and reaching activities independently without significant pain.   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 Intermediate grade, rim rent type interstitial tearing of the supraspinatus tendon at the footprint. Mild distal subscapularis tendinosis with articular sided fraying. No significant supraspinatus, infraspinatus, or subscapularis atrophy.   Tendinosis and possible split tearing of the intra-articular long head biceps tendon.   Moderate glenohumeral and AC joint osteoarthritis.   Degenerative labral tearing as described above.  L shoulder: IMPRESSION: Severe tendinosis of the distal supraspinatus and infraspinatus tendons, with low-grade interstitial tearing of the supraspinatus tendon at the footprint. No retracted cuff tear. No significant muscle atrophy.   AC joint widening with pericapsular and joint edema, suggesting low-grade AC joint injury in the setting of underlying moderate AC joint osteoarthritis.   Mild glenohumeral osteoarthritis. Posterosuperior labral degeneration without displaced labral tear. X rays on 7/18: R shoulder:  IMPRESSION: Degenerative changes with no acute osseous abnormality identified. L shoulder:  IMPRESSION: No evidence for acute abnormality. Similar  acromioclavicular degenerative changes.    TODAY'S TREATMENT:  PT helped pt with positioning and wrapping of her L ankle brace.  Reviewed current function, response to prior Rx, and pain level.  Pt performed: Supine serratus punch 2x10 reps bilat with 1# Supine Shoulder ABC x 1 reps bilat Supine rhythmic stabs 2x30 sec bilat each at 90 deg and 60 deg flexion S/L ER:  2x10 bilat ER/IR with arm at side with YTB 2x10 each bilat except approx 8-9 reps with 2nd set of IR on R 4D ball rolls on table x  10 each except 9 with R on s/s   PATIENT EDUCATION: Education details: Relevant anatomy, rationale of exercises, dx, prognosis, and POC.  PT answered pt's questions. Person educated: Patient Education method: Explanation Education comprehension: verbalized understanding and needs further education   HOME EXERCISE PROGRAM:   Access Code: [email or web address] URL: [email or web address] Date: 01/30/2022 Prepared by: Ronny Flurry  Exercises - Supine Shoulder Alphabet  - 1 x daily - 7 x weekly - 1 reps - Supine Single Arm Shoulder Protraction  - 1 x daily - 6-7 x weekly - 2 sets - 10 reps - Standing Shoulder Row with Anchored Resistance  - 1 x daily - 3-4 x weekly - 2 sets - 10 reps - Shoulder extension with resistance - Neutral  - 1 x daily - 3-4 x weekly - 2 sets - 10 reps  ASSESSMENT:  CLINICAL IMPRESSION: Pt is increasing her activity since having covid including returning to classes.  Pt has been compliant with HEP.  PT progressed exercises today including adding resistance.  She tolerated progression well.  Pt had R shoulder pain with resisted IR and s/s with ball on table which increased with increased reps.  Pt responded well to Rx having no pain after Rx.  Pt should benefit from skilled PT services to address goals and impairments and to improve overall function.    OBJECTIVE IMPAIRMENTS decreased ROM, decreased strength, impaired UE functional use, and pain.   ACTIVITY  LIMITATIONS carrying and lifting  PARTICIPATION LIMITATIONS:  workout activities  PERSONAL FACTORS  chronic pain  are also affecting patient's functional outcome.   REHAB POTENTIAL: Good  CLINICAL DECISION MAKING: Stable/uncomplicated  EVALUATION COMPLEXITY: Low   GOALS:   SHORT TERM GOALS: Target date:02/01/2022  Pt will be independent and compliant with HEP for improved pain, strength, and function.  Baseline: Goal status: INITIAL   LONG TERM GOALS: Target date: 02/22/2022  Pt will demo good understanding of appropriate gym exercises and perform exercises without adverse effects for improved functional strength to improve carrying/lifting. Baseline:  Goal status: INITIAL  2.  Pt will be able to perform workout activities without significant pain.  Baseline:  Goal status: INITIAL  3.  Pt will be able to perform her normal functional lifting activities without significant pain or difficulty.  Baseline:  Goal status: INITIAL  4.  Pt will demo improved bilat ER strength to 4+/5 and R IR strength to 5/5 MMT for improved stability in bilat shoulder for improved tolerance with and performance of functional lifting and carrying.  Baseline:  Goal status: INITIAL    PLAN: PT FREQUENCY: 1x/week  PT DURATION: other: 4-6 weeks  PLANNED INTERVENTIONS: Therapeutic exercises, Therapeutic activity, Neuromuscular re-education, Patient/Family education, Self Care, Joint mobilization, Aquatic Therapy, Dry Needling, Electrical stimulation, Cryotherapy, Moist heat, Ultrasound, Manual therapy, and Re-evaluation  PLAN FOR NEXT SESSION: strengthening and stabilization with regards to MRI findings.  Pt is planning to have R shoulder surgery.     Selinda Michaels III PT, DPT 02/09/22 3:14 PM

## 2022-02-10 DIAGNOSIS — M76822 Posterior tibial tendinitis, left leg: Secondary | ICD-10-CM | POA: Diagnosis not present

## 2022-02-10 DIAGNOSIS — M79672 Pain in left foot: Secondary | ICD-10-CM | POA: Diagnosis not present

## 2022-02-13 ENCOUNTER — Ambulatory Visit (HOSPITAL_BASED_OUTPATIENT_CLINIC_OR_DEPARTMENT_OTHER): Payer: Medicare HMO | Attending: Family Medicine | Admitting: Physical Therapy

## 2022-02-13 ENCOUNTER — Encounter (HOSPITAL_BASED_OUTPATIENT_CLINIC_OR_DEPARTMENT_OTHER): Payer: Self-pay | Admitting: Physical Therapy

## 2022-02-13 DIAGNOSIS — M25511 Pain in right shoulder: Secondary | ICD-10-CM | POA: Diagnosis not present

## 2022-02-13 DIAGNOSIS — M25512 Pain in left shoulder: Secondary | ICD-10-CM | POA: Insufficient documentation

## 2022-02-13 DIAGNOSIS — M76822 Posterior tibial tendinitis, left leg: Secondary | ICD-10-CM | POA: Diagnosis not present

## 2022-02-13 DIAGNOSIS — M79672 Pain in left foot: Secondary | ICD-10-CM | POA: Diagnosis not present

## 2022-02-13 DIAGNOSIS — M6281 Muscle weakness (generalized): Secondary | ICD-10-CM | POA: Insufficient documentation

## 2022-02-13 DIAGNOSIS — G8929 Other chronic pain: Secondary | ICD-10-CM | POA: Insufficient documentation

## 2022-02-13 NOTE — Therapy (Signed)
OUTPATIENT PHYSICAL THERAPY TREATMENT NOTE   Patient Name: Kelly [name] MRN: [id number] DOB:December 05, 1940, 81 y.o., female Today's Date: 02/14/2022   PT End of Session - 02/13/22 1500     Visit Number 4    Number of Visits 7    Date for PT Re-Evaluation 02/22/22    Authorization Type [employer]ET[employer] MCR    PT Start Time 6720    PT Stop Time 1527    PT Time Calculation (min) 42 min    Activity Tolerance Patient tolerated treatment well    Behavior During Therapy WFL for tasks assessed/performed                Past Medical History:  Diagnosis Date   Allergy    Blood transfusion without reported diagnosis    Breast cancer ([place])    Breast mass    fibocystic breast dx   Cataract    Family history of colon cancer 04/15/2020   Family history of pancreatic cancer 04/15/2020   Family history of prostate cancer 04/15/2020   Hepatitis    CMV 1992   Hyperlipidemia    Postmenopausal HRT (hormone replacement therapy)    Scarlet fever as a teen   Sleep apnea    Past Surgical History:  Procedure Laterality Date   APPENDECTOMY     BREAST LUMPECTOMY WITH RADIOACTIVE SEED LOCALIZATION Right 04/27/2020   Procedure: RIGHT BREAST LUMPECTOMY WITH RADIOACTIVE SEED LOCALIZATION;  Surgeon: [name], MD;  Location: [facility];  Service: General;  Laterality: Right;   BUNIONECTOMY     caesarean section     COLONOSCOPY     CORRECTION HAMMER TOE     FOOT TENDON SURGERY     left    POLYPECTOMY     TONSILLECTOMY     TONSILLECTOMY     Patient Active Problem List   Diagnosis Date Noted   Ankle impingement syndrome, left 05/24/2021   Prediabetes 01/21/2021   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   Hepatic steatosis 09/10/2020   Genetic testing 04/26/2020   Malignant neoplasm of upper-outer quadrant of right breast in female, estrogen receptor positive ([place]) 04/05/2020   Degenerative disc disease, cervical 08/07/2019   Lumbar radiculopathy 04/11/2017   Bunion of great toe of right foot 04/18/2016    OSA on CPAP 12/23/2015   Urinary incontinence 03/31/2014   Fibrocystic breast disease 06/06/2011   Routine health maintenance 11/12/2010   Obesity 09/11/2007   Hyperlipidemia 06/10/2007    PCP: Hoyt Koch, MD  REFERRING PROVIDER: Gregor Hams, MD  REFERRING DIAG: M25.511,G89.29,M25.512 (ICD-10-CM) - Chronic pain of both shoulders    THERAPY DIAG:  Chronic right shoulder pain  Chronic left shoulder pain  Muscle weakness (generalized)  Rationale for Evaluation and Treatment Rehabilitation  ONSET DATE: October 2022 Exacerbation  SUBJECTIVE:  SUBJECTIVE STATEMENT: -Pt saw Dr. Georgina Snell on 7/18 and he ordered PT.  MD ordered x rays which showed no acute fractures in bilat shoulder and mild AC DJD in R shoulder and AC DJD in L shoulder.  MD note indicated pt has known rotator cuff partial tears on prior MRI right shoulder.  He also ordered new MRI's. Pt saw Dr. Sammuel Hines on 8/18 and MD note indicated with bilateral shoulder impingement in the setting of significant moderate AC joint arthritis.    -Pt denies pain currently.  Pt denies any adverse effects after prior Rx.  Pt had PT for her L ankle earlier today and also took a chair yoga class this AM.  Pt has been wearing an ankle brace on L ankle.  PT instructed pt to call insurance concerning having PT at 2 different clinics and she stated it was covered.   PERTINENT HISTORY: MRI and x ray findings of bilat shoulders Breast CA, hepatitis, L ankle impingement, L ankle tendon transfer surgery, L foot bunionectomy.      PAIN:  Are you having pain?  NPRS:  Current:  0/10, Worst:  8/10, Best: 0/10     PRECAUTIONS: Other: MRI and x ray findings of bilat shoulders  WEIGHT BEARING RESTRICTIONS No  FALLS:  Has patient fallen in last 6 months?  No   OCCUPATION: Pt is retired  PLOF: Independent; Pt was working out in [employer].  She was able to perform her daily activities and reaching activities independently without significant pain.   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 Intermediate grade, rim rent type interstitial tearing of the supraspinatus tendon at the footprint. Mild distal subscapularis tendinosis with articular sided fraying. No significant supraspinatus, infraspinatus, or subscapularis atrophy.   Tendinosis and possible split tearing of the intra-articular long head biceps tendon.   Moderate glenohumeral and AC joint osteoarthritis.   Degenerative labral tearing as described above.  L shoulder: IMPRESSION: Severe tendinosis of the distal supraspinatus and infraspinatus tendons, with low-grade interstitial tearing of the supraspinatus tendon at the footprint. No retracted cuff tear. No significant muscle atrophy.   AC joint widening with pericapsular and joint edema, suggesting low-grade AC joint injury in the setting of underlying moderate AC joint osteoarthritis.   Mild glenohumeral osteoarthritis. Posterosuperior labral degeneration without displaced labral tear. X rays on 7/18: R shoulder:  IMPRESSION: Degenerative changes with no acute osseous abnormality identified. L shoulder:  IMPRESSION: No evidence for acute abnormality.  Similar acromioclavicular degenerative changes.    TODAY'S TREATMENT:  PT helped pt with positioning and wrapping of her L ankle brace.  Reviewed current function, response to prior Rx, and pain level.  Pt performed: Supine serratus punch x10 with 1# and 2x10 reps bilat with 2# S/L ER:  x10 AROM bilat and with 1# bilat ER/IR with arm at side with YTB 2x10 each bilat except   PT updated HEP and gave pt a HEP handout.  Educated pt in correct form and appropriate frequency.  Neuro Re-ed  activities:  Supine Shoulder ABC x 1 reps bilat  Supine rhythmic stabs 2x30 sec bilat each at 90 deg and 60 deg flexion 4D ball rolls on table x 10 each    PATIENT EDUCATION: Education details:  Updated HEP and gave pt a HEP handout.  Instructed pt she should not have pain with HEP.  Relevant anatomy, rationale of exercises, prognosis, and POC.  PT answered pt's questions. Person educated: Patient Education method: Explanation Education comprehension: verbalized understanding and needs further education   HOME EXERCISE PROGRAM:   Access Code: [email or web address] URL: [email or web address] Date: 01/30/2022 Prepared by: Ronny Flurry  Exercises - Supine Shoulder Alphabet  - 1 x daily - 7 x weekly - 1 reps - Supine Single Arm Shoulder Protraction  - 1 x daily - 6-7 x weekly - 2 sets - 10 reps - Standing Shoulder Row with Anchored Resistance  - 1 x daily - 3-4 x weekly - 2 sets - 10 reps - Shoulder extension with resistance - Neutral  - 1 x daily - 3-4 x weekly - 2 sets - 10 reps  Updated HEP: - Shoulder External Rotation with Anchored Resistance  - 1 x daily - 3 x weekly - 2-3 sets - 10 reps - Shoulder Internal Rotation with Resistance  - 1 x daily - 3 x weekly - 2-3 sets - 10 reps  ASSESSMENT:  CLINICAL IMPRESSION: Pt reports having soreness with 2# weight with supine serratus punches compared to a 1# weight on her R shoulder.  She had no c/o's on L shoulder.  Pt has improved tolerance with  exercises as evidenced by performance of exercises.  Pt requires instruction and cuing for correct form and positioning with exercises.  She responded well to Rx having no increased pain after Rx.  Pt should benefit from skilled PT services to address goals and impairments and to improve overall function.      OBJECTIVE IMPAIRMENTS decreased ROM, decreased strength, impaired UE functional use, and pain.   ACTIVITY LIMITATIONS carrying and lifting  PARTICIPATION LIMITATIONS:  workout activities  PERSONAL FACTORS  chronic pain  are also affecting patient's functional outcome.   REHAB POTENTIAL: Good  CLINICAL DECISION MAKING: Stable/uncomplicated  EVALUATION COMPLEXITY: Low   GOALS:   SHORT TERM GOALS: Target date:02/01/2022  Pt will be independent and compliant with HEP for improved pain, strength, and function.  Baseline: Goal status: INITIAL   LONG TERM GOALS: Target date: 02/22/2022  Pt will demo good understanding of appropriate gym exercises and perform exercises without adverse effects for improved functional strength to improve carrying/lifting. Baseline:  Goal status: INITIAL  2.  Pt will be able to perform workout activities without significant pain.  Baseline:  Goal status: INITIAL  3.  Pt will be able to perform her normal functional lifting activities without significant pain or difficulty.  Baseline:  Goal status: INITIAL  4.  Pt will demo improved bilat ER strength to 4+/5 and R IR strength to 5/5 MMT for improved stability in bilat shoulder for improved tolerance with and performance of functional lifting and carrying.  Baseline:  Goal status: INITIAL    PLAN: PT FREQUENCY: 1x/week  PT DURATION: other: 4-6 weeks  PLANNED INTERVENTIONS: Therapeutic exercises, Therapeutic activity, Neuromuscular re-education, Patient/Family education, Self Care, Joint mobilization, Aquatic Therapy, Dry Needling, Electrical stimulation, Cryotherapy, Moist heat,  Ultrasound, Manual therapy, and Re-evaluation  PLAN FOR NEXT SESSION: strengthening and stabilization with regards to MRI findings.  Pt is planning to have R shoulder surgery.  Selinda Michaels III PT, DPT 02/14/22 9:45 PM

## 2022-02-17 DIAGNOSIS — M79672 Pain in left foot: Secondary | ICD-10-CM | POA: Diagnosis not present

## 2022-02-17 DIAGNOSIS — M76822 Posterior tibial tendinitis, left leg: Secondary | ICD-10-CM | POA: Diagnosis not present

## 2022-02-19 NOTE — Therapy (Signed)
OUTPATIENT PHYSICAL THERAPY TREATMENT NOTE   Patient Name: Kelly [name] MRN: [id number] DOB:1940-07-15, 81 y.o., female Today's Date: 02/19/2022        Past Medical History:  Diagnosis Date   Allergy    Blood transfusion without reported diagnosis    Breast cancer ([place])    Breast mass    fibocystic breast dx   Cataract    Family history of colon cancer 04/15/2020   Family history of pancreatic cancer 04/15/2020   Family history of prostate cancer 04/15/2020   Hepatitis    CMV 1992   Hyperlipidemia    Postmenopausal HRT (hormone replacement therapy)    Scarlet fever as a teen   Sleep apnea    Past Surgical History:  Procedure Laterality Date   APPENDECTOMY     BREAST LUMPECTOMY WITH RADIOACTIVE SEED LOCALIZATION Right 04/27/2020   Procedure: RIGHT BREAST LUMPECTOMY WITH RADIOACTIVE SEED LOCALIZATION;  Surgeon: [name], MD;  Location: [facility];  Service: General;  Laterality: Right;   BUNIONECTOMY     caesarean section     COLONOSCOPY     CORRECTION HAMMER TOE     FOOT TENDON SURGERY     left    POLYPECTOMY     TONSILLECTOMY     TONSILLECTOMY     Patient Active Problem List   Diagnosis Date Noted   Ankle impingement syndrome, left 05/24/2021   Prediabetes 01/21/2021   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   Hepatic steatosis 09/10/2020   Genetic testing 04/26/2020   Malignant neoplasm of upper-outer quadrant of right breast in female, estrogen receptor positive ([place]) 04/05/2020   Degenerative disc disease, cervical 08/07/2019   Lumbar radiculopathy 04/11/2017   Bunion of great toe of right foot 04/18/2016   OSA on CPAP 12/23/2015   Urinary incontinence 03/31/2014   Fibrocystic breast disease 06/06/2011   Routine health maintenance 11/12/2010   Obesity 09/11/2007   Hyperlipidemia 06/10/2007    PCP: Hoyt Koch, MD  REFERRING PROVIDER: Gregor Hams, MD  REFERRING DIAG: M25.511,G89.29,M25.512 (ICD-10-CM) - Chronic pain of both shoulders     THERAPY DIAG:  No diagnosis found.  Rationale for Evaluation and Treatment Rehabilitation  ONSET DATE: October 2022 Exacerbation  SUBJECTIVE:                                                                                                                                                                                      SUBJECTIVE STATEMENT: -Pt saw Dr. Georgina Snell on 7/18 and he ordered PT.  MD ordered x rays which showed no acute fractures in bilat shoulder and mild AC DJD in R shoulder and AC DJD in L shoulder.  MD note indicated  pt has known rotator cuff partial tears on prior MRI right shoulder.  He also ordered new MRI's. Pt saw Dr. Sammuel Hines on 8/18 and MD note indicated with bilateral shoulder impingement in the setting of significant moderate AC joint arthritis.    -Pt denies pain currently.  Pt denies any adverse effects after prior Rx.  Pt had PT for her L ankle earlier today and also took a chair yoga class this AM.  Pt has been wearing an ankle brace on L ankle.  PT instructed pt to call insurance concerning having PT at 2 different clinics and she stated it was covered.   PERTINENT HISTORY: MRI and x ray findings of bilat shoulders Breast CA, hepatitis, L ankle impingement, L ankle tendon transfer surgery, L foot bunionectomy.      PAIN:  Are you having pain?  NPRS:  Current:  0/10, Worst:  8/10, Best: 0/10     PRECAUTIONS: Other: MRI and x ray findings of bilat shoulders  WEIGHT BEARING RESTRICTIONS No  FALLS:  Has patient fallen in last 6 months? No   OCCUPATION: Pt is retired  PLOF: Independent; Pt was working out in [employer].  She was able to perform her daily activities and reaching activities independently without significant pain.   PATIENT GOALS strengthen shoulders, wants to know safe exercises in the gym  OBJECTIVE:   DIAGNOSTIC FINDINGS:  Per MD note on 12/30/21: Xray (3 views right shoulder, 3 views left shoulder): There is moderate AC joint  osteoarthritis bilaterally and well appearing glenohumeral joint bilaterally as well   MRI (right shoulder, left shoulder): Bilateral evidence of moderate to severe AC joint arthropathy with interstitial tearing of bilateral rotator cuff tears in the setting of impingement  MRI's per Epic: R shoulder: IMPRESSION: Severe tendinosis of the distal supraspinatus and infraspinatus tendons. Intermediate grade, rim rent type interstitial tearing of the supraspinatus tendon at the footprint. Mild distal subscapularis tendinosis with articular sided fraying. No significant supraspinatus, infraspinatus, or subscapularis atrophy.   Tendinosis and possible split tearing of the intra-articular long head biceps tendon.   Moderate glenohumeral and AC joint osteoarthritis.   Degenerative labral tearing as described above.  L shoulder: IMPRESSION: Severe tendinosis of the distal supraspinatus and infraspinatus tendons, with low-grade interstitial tearing of the supraspinatus tendon at the footprint. No retracted cuff tear. No significant muscle atrophy.   AC joint widening with pericapsular and joint edema, suggesting low-grade AC joint injury in the setting of underlying moderate AC joint osteoarthritis.   Mild glenohumeral osteoarthritis. Posterosuperior labral degeneration without displaced labral tear. X rays on 7/18: R shoulder:  IMPRESSION: Degenerative changes with no acute osseous abnormality identified. L shoulder:  IMPRESSION: No evidence for acute abnormality. Similar acromioclavicular degenerative changes.    TODAY'S TREATMENT:  PT helped pt with positioning and wrapping of her L ankle brace.  Reviewed current function, response to prior Rx, and pain level.  Pt performed: Supine serratus punch x10 with 1# and 2x10 reps bilat with 2# S/L ER:  x10 AROM bilat and with 1# bilat ER/IR with arm at side with YTB 2x10 each bilat except   PT updated HEP and gave pt a HEP handout.   Educated pt in correct form and appropriate frequency.  Neuro Re-ed activities:  Supine Shoulder ABC x 1 reps bilat  Supine rhythmic stabs 2x30 sec bilat each at 90 deg and 60 deg flexion 4D ball rolls on table x 10 each    PATIENT EDUCATION: Education details:  Updated HEP  and gave pt a HEP handout.  Instructed pt she should not have pain with HEP.  Relevant anatomy, rationale of exercises, prognosis, and POC.  PT answered pt's questions. Person educated: Patient Education method: Explanation Education comprehension: verbalized understanding and needs further education   HOME EXERCISE PROGRAM:   Access Code: [email or web address] URL: [email or web address] Date: 01/30/2022 Prepared by: Ronny Flurry  Exercises - Supine Shoulder Alphabet  - 1 x daily - 7 x weekly - 1 reps - Supine Single Arm Shoulder Protraction  - 1 x daily - 6-7 x weekly - 2 sets - 10 reps - Standing Shoulder Row with Anchored Resistance  - 1 x daily - 3-4 x weekly - 2 sets - 10 reps - Shoulder extension with resistance - Neutral  - 1 x daily - 3-4 x weekly - 2 sets - 10 reps  Updated HEP: - Shoulder External Rotation with Anchored Resistance  - 1 x daily - 3 x weekly - 2-3 sets - 10 reps - Shoulder Internal Rotation with Resistance  - 1 x daily - 3 x weekly - 2-3 sets - 10 reps  ASSESSMENT:  CLINICAL IMPRESSION: Pt reports having soreness with 2# weight with supine serratus punches compared to a 1# weight on her R shoulder.  She had no c/o's on L shoulder.  Pt has improved tolerance with exercises as evidenced by performance of exercises.  Pt requires instruction and cuing for correct form and positioning with exercises.  She responded well to Rx having no increased pain after Rx.  Pt should benefit from skilled PT services to address goals and impairments and to improve overall function.      OBJECTIVE IMPAIRMENTS decreased ROM, decreased strength, impaired UE functional use, and pain.   ACTIVITY  LIMITATIONS carrying and lifting  PARTICIPATION LIMITATIONS:  workout activities  PERSONAL FACTORS  chronic pain  are also affecting patient's functional outcome.   REHAB POTENTIAL: Good  CLINICAL DECISION MAKING: Stable/uncomplicated  EVALUATION COMPLEXITY: Low   GOALS:   SHORT TERM GOALS: Target date:02/01/2022  Pt will be independent and compliant with HEP for improved pain, strength, and function.  Baseline: Goal status: INITIAL   LONG TERM GOALS: Target date: 02/22/2022  Pt will demo good understanding of appropriate gym exercises and perform exercises without adverse effects for improved functional strength to improve carrying/lifting. Baseline:  Goal status: INITIAL  2.  Pt will be able to perform workout activities without significant pain.  Baseline:  Goal status: INITIAL  3.  Pt will be able to perform her normal functional lifting activities without significant pain or difficulty.  Baseline:  Goal status: INITIAL  4.  Pt will demo improved bilat ER strength to 4+/5 and R IR strength to 5/5 MMT for improved stability in bilat shoulder for improved tolerance with and performance of functional lifting and carrying.  Baseline:  Goal status: INITIAL    PLAN: PT FREQUENCY: 1x/week  PT DURATION: other: 4-6 weeks  PLANNED INTERVENTIONS: Therapeutic exercises, Therapeutic activity, Neuromuscular re-education, Patient/Family education, Self Care, Joint mobilization, Aquatic Therapy, Dry Needling, Electrical stimulation, Cryotherapy, Moist heat, Ultrasound, Manual therapy, and Re-evaluation  PLAN FOR NEXT SESSION: strengthening and stabilization with regards to MRI findings.  Pt is planning to have R shoulder surgery.     Selinda Michaels III PT, DPT 02/19/22 1:23 PM

## 2022-02-20 ENCOUNTER — Ambulatory Visit (HOSPITAL_BASED_OUTPATIENT_CLINIC_OR_DEPARTMENT_OTHER): Payer: Medicare HMO | Admitting: Physical Therapy

## 2022-02-20 ENCOUNTER — Encounter (HOSPITAL_BASED_OUTPATIENT_CLINIC_OR_DEPARTMENT_OTHER): Payer: Self-pay | Admitting: Physical Therapy

## 2022-02-20 DIAGNOSIS — M6281 Muscle weakness (generalized): Secondary | ICD-10-CM | POA: Diagnosis not present

## 2022-02-20 DIAGNOSIS — G8929 Other chronic pain: Secondary | ICD-10-CM

## 2022-02-20 DIAGNOSIS — M25511 Pain in right shoulder: Secondary | ICD-10-CM | POA: Diagnosis not present

## 2022-02-20 DIAGNOSIS — M25512 Pain in left shoulder: Secondary | ICD-10-CM | POA: Diagnosis not present

## 2022-02-21 DIAGNOSIS — M76822 Posterior tibial tendinitis, left leg: Secondary | ICD-10-CM | POA: Diagnosis not present

## 2022-02-21 DIAGNOSIS — M79672 Pain in left foot: Secondary | ICD-10-CM | POA: Diagnosis not present

## 2022-02-23 DIAGNOSIS — M79672 Pain in left foot: Secondary | ICD-10-CM | POA: Diagnosis not present

## 2022-02-23 DIAGNOSIS — M76822 Posterior tibial tendinitis, left leg: Secondary | ICD-10-CM | POA: Diagnosis not present

## 2022-02-27 ENCOUNTER — Encounter (HOSPITAL_BASED_OUTPATIENT_CLINIC_OR_DEPARTMENT_OTHER): Payer: Self-pay | Admitting: Physical Therapy

## 2022-02-27 ENCOUNTER — Ambulatory Visit (HOSPITAL_BASED_OUTPATIENT_CLINIC_OR_DEPARTMENT_OTHER): Payer: Medicare HMO | Admitting: Physical Therapy

## 2022-02-27 DIAGNOSIS — M25512 Pain in left shoulder: Secondary | ICD-10-CM | POA: Diagnosis not present

## 2022-02-27 DIAGNOSIS — M6281 Muscle weakness (generalized): Secondary | ICD-10-CM

## 2022-02-27 DIAGNOSIS — G8929 Other chronic pain: Secondary | ICD-10-CM | POA: Diagnosis not present

## 2022-02-27 DIAGNOSIS — M25511 Pain in right shoulder: Secondary | ICD-10-CM | POA: Diagnosis not present

## 2022-02-27 NOTE — Therapy (Signed)
OUTPATIENT PHYSICAL THERAPY TREATMENT NOTE / PROGRESS NOTE  Progress Note Reporting Period 01/11/22 to 02/27/22  See note below for Objective Data and Assessment of Progress/Goals.       Patient Name: Kelly [name] MRN: [id number] DOB:[birth year], 81 y.o., female Today's Date: 02/27/2022   PT End of Session - 02/27/22 1526     Visit Number 6    Number of Visits 10    Date for PT Re-Evaluation 03/27/22    Authorization Type [employer] MCR    PT Start Time 8088    PT Stop Time 1103    PT Time Calculation (min) 41 min    Activity Tolerance Patient tolerated treatment well    Behavior During Therapy [facility] for tasks assessed/performed                  Past Medical History:  Diagnosis Date   Allergy    Blood transfusion without reported diagnosis    Breast cancer ([place])    Breast mass    fibocystic breast dx   Cataract    Family history of colon cancer 04/15/2020   Family history of pancreatic cancer 04/15/2020   Family history of prostate cancer 04/15/2020   Hepatitis    CMV 1992   Hyperlipidemia    Postmenopausal HRT (hormone replacement therapy)    Scarlet fever as a teen   Sleep apnea    Past Surgical History:  Procedure Laterality Date   APPENDECTOMY     BREAST LUMPECTOMY WITH RADIOACTIVE SEED LOCALIZATION Right 04/27/2020   Procedure: RIGHT BREAST LUMPECTOMY WITH RADIOACTIVE SEED LOCALIZATION;  Surgeon: [name], MD;  Location: [place];  Service: General;  Laterality: Right;   BUNIONECTOMY     caesarean section     COLONOSCOPY     CORRECTION HAMMER TOE     FOOT TENDON SURGERY     left    POLYPECTOMY     TONSILLECTOMY     TONSILLECTOMY     Patient Active Problem List   Diagnosis Date Noted   Ankle impingement syndrome, left 05/24/2021   Prediabetes 01/21/2021   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   Hepatic steatosis 09/10/2020   Genetic testing 04/26/2020   Malignant neoplasm of upper-outer quadrant of right breast in female, estrogen receptor positive  ([place]) 04/05/2020   Degenerative disc disease, cervical 08/07/2019   Lumbar radiculopathy 04/11/2017   Bunion of great toe of right foot 04/18/2016   OSA on CPAP 12/23/2015   Urinary incontinence 03/31/2014   Fibrocystic breast disease 06/06/2011   Routine health maintenance 11/12/2010   Obesity 09/11/2007   Hyperlipidemia 06/10/2007    PCP: Hoyt Koch, MD  REFERRING PROVIDER: Gregor Hams, MD  REFERRING DIAG: M25.511,G89.29,M25.512 (ICD-10-CM) - Chronic pain of both shoulders    THERAPY DIAG:  Chronic right shoulder pain  Chronic left shoulder pain  Muscle weakness (generalized)  Rationale for Evaluation and Treatment Rehabilitation  ONSET DATE: October 2022 Exacerbation  SUBJECTIVE:  SUBJECTIVE STATEMENT: -Pt states she is supposed to return to ortho MD 2 months after her injection.  She is probably having surgery on R shoulder.  -Pt reports improved lifting objects and is able to perform her normal functional lifting activities.  Pt is able to make her bed and load/unload the dishwasher without difficulty.  She has no problems with driving.  Pt denies shoulder pain with chair yoga.  Pt is able to put 3 dinner plates on the 2nd shelf in the cabinet.  Pt reports compliance with HEP.  Pt reports the exercises are getting easier.  Pt states she feels more relaxed about it all.    -Pt denies any adverse effects after prior Rx.  Pt has not returned to her gym workouts.   PERTINENT HISTORY: MRI and x ray findings of bilat shoulders Breast CA, hepatitis, L ankle impingement, L ankle tendon transfer surgery, L foot bunionectomy.      PAIN:  Are you having pain?  NPRS:  Current:  0/10, Worst:  4/10, Best: 0/10 Location:  bilat shoulders   PRECAUTIONS: Other: MRI and x ray findings of  bilat shoulders  WEIGHT BEARING RESTRICTIONS No  FALLS:  Has patient fallen in last 6 months? No   OCCUPATION: Pt is retired  PLOF: Independent; Pt was working out in [employer].  She was able to perform her daily activities and reaching activities independently without significant pain.   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 Intermediate grade, rim rent type interstitial tearing of the supraspinatus tendon at the footprint. Mild distal subscapularis tendinosis with articular sided fraying. No significant supraspinatus, infraspinatus, or subscapularis atrophy.   Tendinosis and possible split tearing of the intra-articular long head biceps tendon.   Moderate glenohumeral and AC joint osteoarthritis.   Degenerative labral tearing as described above.  L shoulder: IMPRESSION: Severe tendinosis of the distal supraspinatus and infraspinatus tendons, with low-grade interstitial tearing of the supraspinatus tendon at the footprint. No retracted cuff tear. No significant muscle atrophy.   AC joint widening with pericapsular and joint edema, suggesting low-grade AC joint injury in the setting of underlying moderate AC joint osteoarthritis.   Mild glenohumeral osteoarthritis. Posterosuperior labral degeneration without displaced labral tear. X rays on 7/18: R shoulder:  IMPRESSION: Degenerative changes with no acute  osseous abnormality identified. L shoulder:  IMPRESSION: No evidence for acute abnormality. Similar acromioclavicular degenerative changes.    TODAY'S TREATMENT:  -Reviewed current function, response to prior Rx, HEP compliance, and pain level.  -See below for pt education.  UPPER EXTREMITY ROM:    Active ROM Right eval Left eval Right 10/16 Left 10/16  Shoulder flexion 168 168 163 168  Shoulder scaption 164 160 157 158  Shoulder abduction 138 with pain 148 140 without pain 135  Shoulder adduction        Shoulder internal rotation 70 74 74 72  Shoulder external rotation 70 70 63 69  Elbow flexion        Elbow extension  Wrist flexion        Wrist extension        Wrist ulnar deviation        Wrist radial deviation        Wrist pronation        Wrist supination        (Blank rows = not tested)     UPPER EXTREMITY MMT:   MMT Right eval Left eval Right 10/16 Left 10/16  Shoulder flexion 5/5 5/5 4+/5 5/5  Shoulder scaption 4+/5 5/5 5/5 w/n available ROM 5/5  Shoulder abduction 4-/5 4+/5 4/5 4+/5  Shoulder adduction        Shoulder internal rotation 4+/5 5/5 4+/5 5/5  Shoulder external rotation 4/5 4/5 4/5 4/5  Middle trapezius        Lower trapezius        Elbow flexion        Elbow extension        Wrist flexion        Wrist extension        Wrist ulnar deviation        Wrist radial deviation        Wrist pronation        Wrist supination        Grip strength (lbs)        (Blank rows = not tested)   SHOULDER SPECIAL TESTS:            Impingement tests:    Neer's:  negative bilat           Michel Bickers Impingement: R: positive, L: negative    PATIENT EDUCATION: Education details:  PT educated pt with objective findings and in comparison to initial eval.  Goal progress, rationale of exercises, prognosis, and POC.  PT answered pt's questions. Person educated: Patient Education method: Explanation, tactile cues, verbal cues,  demonstration Education comprehension: verbalized understanding and needs further education, returned demonstration, verbal and tactile cues required   HOME EXERCISE PROGRAM:   Access Code: [email or web address] URL: [email or web address] Prepared by: Selinda Michaels III PT    ASSESSMENT:  CLINICAL IMPRESSION: Pt is progressing well functionally as evidenced by subjective reports including performing household chores and lifting dinner plates on 2nd shelf.  Pt's Worst pain has improved from 8/10 initially to 4/10 currently.  Pt is able to perform form her functional lifting activities without significant difficulty.  Pt has returned to chair yoga and denies shoulder pain with chair yoga.  She has not returned to gym workout.   Pt initially had decreased R shoulder scaption and L shoulder abduction AROM which greatly improved with repetition though still had less AROM compared to initial evaluation.   She had decreased ER AROM compared to initial Rx. Pt has normal Abduction PROM.  Pt now has a negative Neer's impingement test on R UE.  Pt continues to have bilat ER weakness and demonstrates improved R shoulder scaption and abduction strength.  Pt met STG #1 and LTG #3 and partially met LTG #2.  Pt should benefit from continued skilled PT services to address goals and impairments and to improve overall function.           OBJECTIVE IMPAIRMENTS decreased ROM, decreased strength, impaired UE functional use, and pain.   ACTIVITY LIMITATIONS carrying and lifting  PARTICIPATION LIMITATIONS:  workout activities  PERSONAL FACTORS  chronic pain  are also affecting patient's functional outcome.   REHAB POTENTIAL: Good  CLINICAL DECISION MAKING: Stable/uncomplicated  EVALUATION COMPLEXITY: Low  GOALS:   SHORT TERM GOALS: Target date:02/01/2022  Pt will be independent and compliant with HEP for improved pain, strength, and function.  Baseline: Goal status: GOAL MET   LONG TERM GOALS:  Target date: 03/26/2022   Pt will demo good understanding of appropriate gym exercises and perform exercises without adverse effects for improved functional strength to improve carrying/lifting. Baseline:  Goal status: NOT MET  2.  Pt will be able to perform workout activities without significant pain.  Baseline:  Goal status: progressing  3.  Pt will be able to perform her normal functional lifting activities without significant pain or difficulty.  Baseline:  Goal status: GOAL MET  4.  Pt will demo improved bilat ER strength to 4+/5 and R IR strength to 5/5 MMT for improved stability in bilat shoulder for improved tolerance with and performance of functional lifting and carrying.  Baseline:  Goal status: ONGOING    PLAN: PT FREQUENCY: 1x/week  PT DURATION: other: 4weeks  PLANNED INTERVENTIONS: Therapeutic exercises, Therapeutic activity, Neuromuscular re-education, Patient/Family education, Self Care, Joint mobilization, Aquatic Therapy, Dry Needling, Electrical stimulation, Cryotherapy, Moist heat, Ultrasound, Manual therapy, and Re-evaluation  PLAN FOR NEXT SESSION: strengthening and stabilization with regards to MRI findings.  Pt is planning to have R shoulder surgery.  Discuss with pt gym precautions and appropriate exercises.    Selinda Michaels III PT, DPT 02/27/22 8:48 PM

## 2022-02-28 DIAGNOSIS — M79672 Pain in left foot: Secondary | ICD-10-CM | POA: Diagnosis not present

## 2022-02-28 DIAGNOSIS — M76822 Posterior tibial tendinitis, left leg: Secondary | ICD-10-CM | POA: Diagnosis not present

## 2022-03-01 ENCOUNTER — Telehealth: Payer: Self-pay | Admitting: Internal Medicine

## 2022-03-01 NOTE — Telephone Encounter (Signed)
PT calls today in regards to several topics. PT was seen by Dr.Crawford in June of this year and was not able to check her A1C as she had not fasted for the appointment. PT was informed to check back with her in a couple months to get this A1C checked.  Would PT still require an OV for this A1C check?  PT also had some other concerns she wanted to go over with Dr.Crawford with which I did make an OV for her for next Friday (10/27).   (I originally was trying to get PT to do the A1C next Friday but she was wanting to have the results by that to go over with Dr.Crawford)  CB if needed: [phone]

## 2022-03-02 DIAGNOSIS — M79672 Pain in left foot: Secondary | ICD-10-CM | POA: Diagnosis not present

## 2022-03-02 DIAGNOSIS — M76822 Posterior tibial tendinitis, left leg: Secondary | ICD-10-CM | POA: Diagnosis not present

## 2022-03-03 ENCOUNTER — Other Ambulatory Visit (INDEPENDENT_AMBULATORY_CARE_PROVIDER_SITE_OTHER): Payer: Medicare HMO

## 2022-03-03 ENCOUNTER — Other Ambulatory Visit: Payer: Self-pay | Admitting: Internal Medicine

## 2022-03-03 DIAGNOSIS — E559 Vitamin D deficiency, unspecified: Secondary | ICD-10-CM | POA: Diagnosis not present

## 2022-03-03 DIAGNOSIS — E785 Hyperlipidemia, unspecified: Secondary | ICD-10-CM

## 2022-03-03 DIAGNOSIS — R7303 Prediabetes: Secondary | ICD-10-CM

## 2022-03-03 HISTORY — DX: Vitamin D deficiency, unspecified: E55.9

## 2022-03-03 LAB — COMPREHENSIVE METABOLIC PANEL
ALT: 16 U/L (ref 0–35)
AST: 18 U/L (ref 0–37)
Albumin: 4.5 g/dL (ref 3.5–5.2)
Alkaline Phosphatase: 57 U/L (ref 39–117)
BUN: 20 mg/dL (ref 6–23)
CO2: 26 mEq/L (ref 19–32)
Calcium: 9.8 mg/dL (ref 8.4–10.5)
Chloride: 105 mEq/L (ref 96–112)
Creatinine, Ser: 0.82 mg/dL (ref 0.40–1.20)
GFR: 68.01 mL/min (ref >60.00)
Glucose, Bld: 117 mg/dL — ABNORMAL HIGH (ref 70–99)
Potassium: 4.1 mEq/L (ref 3.5–5.1)
Sodium: 142 mEq/L (ref 135–145)
Total Bilirubin: 0.5 mg/dL (ref 0.2–1.2)
Total Protein: 7.5 g/dL (ref 6.0–8.3)

## 2022-03-03 LAB — CBC WITH DIFFERENTIAL/PLATELET
Basophils Absolute: 0.1 10*3/uL (ref 0.0–0.1)
Basophils Relative: 1 % (ref 0.0–3.0)
Eosinophils Absolute: 0.2 10*3/uL (ref 0.0–0.7)
Eosinophils Relative: 3.4 % (ref 0.0–5.0)
HCT: 40.7 % (ref 36.0–46.0)
Hemoglobin: 13.6 g/dL (ref 12.0–15.0)
Lymphocytes Relative: 39.4 % (ref 12.0–46.0)
Lymphs Abs: 2.5 10*3/uL (ref 0.7–4.0)
MCHC: 33.5 g/dL (ref 30.0–36.0)
MCV: 89.1 fl (ref 78.0–100.0)
Monocytes Absolute: 0.6 10*3/uL (ref 0.1–1.0)
Monocytes Relative: 9.1 % (ref 3.0–12.0)
Neutro Abs: 3 10*3/uL (ref 1.4–7.7)
Neutrophils Relative %: 47.1 % (ref 43.0–77.0)
Platelets: 382 10*3/uL (ref 150.0–400.0)
RBC: 4.57 Mil/uL (ref 3.87–5.11)
RDW: 13.5 % (ref 11.5–15.5)
WBC: 6.4 10*3/uL (ref 4.0–10.5)

## 2022-03-03 LAB — VITAMIN D 25 HYDROXY (VIT D DEFICIENCY, FRACTURES): VITD: 35.12 ng/mL (ref 30.00–100.00)

## 2022-03-03 LAB — LIPID PANEL
Cholesterol: 168 mg/dL (ref 0–200)
HDL: 42.1 mg/dL (ref >39.00)
LDL Cholesterol: 90 mg/dL (ref 0–99)
NonHDL: 125.66
Total CHOL/HDL Ratio: 4
Triglycerides: 177 mg/dL — ABNORMAL HIGH (ref 0.0–149.0)
VLDL: 35.4 mg/dL (ref 0.0–40.0)

## 2022-03-03 LAB — HEMOGLOBIN A1C: Hgb A1c MFr Bld: 6.6 % — ABNORMAL HIGH (ref 4.6–6.5)

## 2022-03-06 ENCOUNTER — Encounter (HOSPITAL_BASED_OUTPATIENT_CLINIC_OR_DEPARTMENT_OTHER): Payer: Medicare HMO | Admitting: Physical Therapy

## 2022-03-06 DIAGNOSIS — M76822 Posterior tibial tendinitis, left leg: Secondary | ICD-10-CM | POA: Diagnosis not present

## 2022-03-06 DIAGNOSIS — M79672 Pain in left foot: Secondary | ICD-10-CM | POA: Diagnosis not present

## 2022-03-07 ENCOUNTER — Encounter (HOSPITAL_BASED_OUTPATIENT_CLINIC_OR_DEPARTMENT_OTHER): Payer: Self-pay | Admitting: Physical Therapy

## 2022-03-07 ENCOUNTER — Ambulatory Visit (HOSPITAL_BASED_OUTPATIENT_CLINIC_OR_DEPARTMENT_OTHER): Payer: Medicare HMO | Admitting: Physical Therapy

## 2022-03-07 ENCOUNTER — Encounter (HOSPITAL_BASED_OUTPATIENT_CLINIC_OR_DEPARTMENT_OTHER): Payer: Medicare HMO | Admitting: Physical Therapy

## 2022-03-07 DIAGNOSIS — G8929 Other chronic pain: Secondary | ICD-10-CM | POA: Diagnosis not present

## 2022-03-07 DIAGNOSIS — M25512 Pain in left shoulder: Secondary | ICD-10-CM | POA: Diagnosis not present

## 2022-03-07 DIAGNOSIS — M6281 Muscle weakness (generalized): Secondary | ICD-10-CM

## 2022-03-07 DIAGNOSIS — M25511 Pain in right shoulder: Secondary | ICD-10-CM | POA: Diagnosis not present

## 2022-03-07 NOTE — Therapy (Signed)
OUTPATIENT PHYSICAL THERAPY TREATMENT NOTE / PROGRESS NOTE  Progress Note Reporting Period 01/11/22 to 02/27/22  See note below for Objective Data and Assessment of Progress/Goals.       Patient Name: Kelly [name] MRN: [id number] DOB:07-27-1940, 81 y.o., female Today's Date: 03/07/2022   PT End of Session - 03/07/22 0942     Visit Number 7    PT Start Time 7106                  Past Medical History:  Diagnosis Date   Allergy    Blood transfusion without reported diagnosis    Breast cancer ([place])    Breast mass    fibocystic breast dx   Cataract    Family history of colon cancer 04/15/2020   Family history of pancreatic cancer 04/15/2020   Family history of prostate cancer 04/15/2020   Hepatitis    CMV 1992   Hyperlipidemia    Postmenopausal HRT (hormone replacement therapy)    Scarlet fever as a teen   Sleep apnea    Past Surgical History:  Procedure Laterality Date   APPENDECTOMY     BREAST LUMPECTOMY WITH RADIOACTIVE SEED LOCALIZATION Right 04/27/2020   Procedure: RIGHT BREAST LUMPECTOMY WITH RADIOACTIVE SEED LOCALIZATION;  Surgeon: [name], MD;  Location: [place];  Service: General;  Laterality: Right;   BUNIONECTOMY     caesarean section     COLONOSCOPY     CORRECTION HAMMER TOE     FOOT TENDON SURGERY     left    POLYPECTOMY     TONSILLECTOMY     TONSILLECTOMY     Patient Active Problem List   Diagnosis Date Noted   Vitamin D deficiency 03/03/2022   Ankle impingement syndrome, left 05/24/2021   Prediabetes 01/21/2021   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   Hepatic steatosis 09/10/2020   Genetic testing 04/26/2020   Malignant neoplasm of upper-outer quadrant of right breast in female, estrogen receptor positive ([place]) 04/05/2020   Degenerative disc disease, cervical 08/07/2019   Lumbar radiculopathy 04/11/2017   Bunion of great toe of right foot 04/18/2016   OSA on CPAP 12/23/2015   Urinary incontinence 03/31/2014   Fibrocystic breast  disease 06/06/2011   Routine health maintenance 11/12/2010   Obesity 09/11/2007   Hyperlipidemia 06/10/2007    PCP: Hoyt Koch, MD  REFERRING PROVIDER: Gregor Hams, MD  REFERRING DIAG: M25.511,G89.29,M25.512 (ICD-10-CM) - Chronic pain of both shoulders    THERAPY DIAG:  No diagnosis found.  Rationale for Evaluation and Treatment Rehabilitation  ONSET DATE: October 2022 Exacerbation  SUBJECTIVE:  SUBJECTIVE STATEMENT: -Pt states she is supposed to return to ortho MD 2 months after her injection.  She is probably having surgery on R shoulder.  -Pt reports improved lifting objects and is able to perform her normal functional lifting activities.  Pt is able to make her bed and load/unload the dishwasher without difficulty.  She has no problems with driving.  Pt denies shoulder pain with chair yoga.  Pt reports compliance with HEP.  Pt reports the exercises are getting easier.     Pt reports she made a mistake informing PT she put dinner plates on the 2nd shelf.  She meant to say it was the 1st shelf.   -Pt denies any adverse effects after prior Rx.  Pt has not returned to her gym workouts.  Pt denies shoulder pain at rest.  She does have minimal pain in R lateral shoulder and proximal R lateral UE with movement.    Pt states she has some lateral R shoulder and lateral R prox UE pain with her exercise classes.   PERTINENT HISTORY: MRI and x ray findings of bilat shoulders Breast CA, hepatitis, L ankle impingement, L ankle tendon transfer surgery, L foot bunionectomy.       PRECAUTIONS: Other: MRI and x ray findings of bilat shoulders  WEIGHT BEARING RESTRICTIONS No  FALLS:  Has patient fallen in last 6 months? No   OCCUPATION: Pt is retired  PLOF: Independent; Pt was working out in  [employer].  She was able to perform her daily activities and reaching activities independently without significant pain.   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 Intermediate grade, rim rent type interstitial tearing of the supraspinatus tendon at the footprint. Mild distal subscapularis tendinosis with articular sided fraying. No significant supraspinatus, infraspinatus, or subscapularis atrophy.   Tendinosis and possible split tearing of the intra-articular long head biceps tendon.   Moderate glenohumeral and AC joint osteoarthritis.   Degenerative labral tearing as described above.  L shoulder: IMPRESSION: Severe tendinosis of the distal supraspinatus and infraspinatus tendons, with low-grade interstitial tearing of the supraspinatus tendon at the footprint. No retracted cuff tear. No significant muscle atrophy.   AC joint widening with pericapsular and joint edema, suggesting low-grade AC joint injury in the setting of underlying moderate AC joint osteoarthritis.   Mild glenohumeral osteoarthritis. Posterosuperior labral degeneration without displaced labral tear. X rays on 7/18: R shoulder:  IMPRESSION: Degenerative changes with no acute osseous abnormality identified. L shoulder:  IMPRESSION: No evidence for acute abnormality. Similar acromioclavicular degenerative changes.    TODAY'S TREATMENT:  -Reviewed  current function, response to prior Rx, HEP compliance, and pain level.    Pt performed: Rows with GTB 2x10 Standing shoulder extension with GTB 2x10  ER with arm at side:  R: YTB x10, RTB x 10 ; L:  YTB x 10, RTB 2x10 IR with arm at side RTB 2x10 bilat   See below for pt education    Neuro Re-ed activities:  Supine rhythmic  stabs 2x30 sec bilat each at 60, 90, and 120 deg flexion 4D ball rolls on wall with 1.1# on R and 2.2# x 10 on L  Standing scap retraction/protraction with hands on wall    UPPER EXTREMITY ROM:    Active ROM Right eval Left eval Right 10/16 Left 10/16  Shoulder flexion 168 168 163 168  Shoulder scaption 164 160 157 158  Shoulder abduction 138 with pain 148 140 without pain 135  Shoulder adduction        Shoulder internal rotation 70 74 74 72  Shoulder external rotation 70 70 63 69  Elbow flexion        Elbow extension        Wrist flexion        Wrist extension        Wrist ulnar deviation        Wrist radial deviation        Wrist pronation        Wrist supination        (Blank rows = not tested)     UPPER EXTREMITY MMT:   MMT Right eval Left eval Right 10/16 Left 10/16  Shoulder flexion 5/5 5/5 4+/5 5/5  Shoulder scaption 4+/5 5/5 5/5 w/n available ROM 5/5  Shoulder abduction 4-/5 4+/5 4/5 4+/5  Shoulder adduction        Shoulder internal rotation 4+/5 5/5 4+/5 5/5  Shoulder external rotation 4/5 4/5 4/5 4/5  Middle trapezius        Lower trapezius        Elbow flexion        Elbow extension        Wrist flexion        Wrist extension        Wrist ulnar deviation        Wrist radial deviation        Wrist pronation        Wrist supination        Grip strength (lbs)        (Blank rows = not tested)   SHOULDER SPECIAL TESTS:            Impingement tests:    Neer's:  negative bilat           Michel Bickers Impingement: R: positive, L: negative    PATIENT EDUCATION: Education details:  PT educated pt with objective  findings and in comparison to initial eval.  Goal progress, rationale of exercises, prognosis, and POC.  PT answered pt's questions. Person educated: Patient Education method: Explanation, tactile cues, verbal cues, demonstration Education comprehension: verbalized understanding and needs further education, returned demonstration, verbal and tactile cues required   HOME EXERCISE PROGRAM:   Access Code: [email or web address] URL: [email or web address] Prepared by: Selinda Michaels III PT    ASSESSMENT:  CLINICAL IMPRESSION: Pt is progressing well functionally as evidenced by subjective reports including performing household chores and lifting dinner plates on 2nd shelf.  Pt's Worst pain has improved from 8/10 initially to 4/10 currently.  Pt is able to perform form her functional lifting activities without significant difficulty.  Pt has returned to chair yoga and denies shoulder pain with chair yoga.  She has not returned to gym workout.   Pt initially had decreased R shoulder scaption and L shoulder abduction AROM which greatly improved with repetition though still had less AROM compared to initial evaluation.   She had decreased ER AROM compared to initial Rx. Pt has normal Abduction PROM.  Pt now has a negative Neer's impingement test on R UE.  Pt continues to have bilat ER weakness and demonstrates improved R shoulder scaption and abduction strength.  Pt met STG #1 and LTG #3 and partially met LTG #2.  Pt should benefit from continued skilled PT services to address goals and impairments and to improve overall function.      PT increased resistance with standing ER and pt reports it feeling different on R shoulder compared to L shoulder. Cues for form No pain       OBJECTIVE IMPAIRMENTS decreased ROM, decreased strength, impaired UE functional use, and pain.   ACTIVITY LIMITATIONS carrying and lifting  PARTICIPATION LIMITATIONS:  workout activities  PERSONAL FACTORS  chronic pain  are  also affecting patient's functional outcome.   REHAB POTENTIAL: Good  CLINICAL DECISION MAKING: Stable/uncomplicated  EVALUATION COMPLEXITY: Low   GOALS:   SHORT TERM GOALS: Target date:02/01/2022  Pt will be independent and compliant with HEP for improved pain, strength, and function.  Baseline: Goal status: GOAL MET   LONG TERM GOALS: Target date: 03/26/2022   Pt will demo good understanding of appropriate gym exercises and perform exercises without adverse effects for improved functional strength to improve carrying/lifting. Baseline:  Goal status: NOT MET  2.  Pt will be able to perform workout activities without significant pain.  Baseline:  Goal status: progressing  3.  Pt will be able to perform her normal functional lifting activities without significant pain or difficulty.  Baseline:  Goal status: GOAL MET  4.  Pt will demo improved bilat ER strength to 4+/5 and R IR strength to 5/5 MMT for improved stability in bilat shoulder for improved tolerance with and performance of functional lifting and carrying.  Baseline:  Goal status: ONGOING    PLAN: PT FREQUENCY: 1x/week  PT DURATION: other: 4weeks  PLANNED INTERVENTIONS: Therapeutic exercises, Therapeutic activity, Neuromuscular re-education, Patient/Family education, Self Care, Joint mobilization, Aquatic Therapy, Dry Needling, Electrical stimulation, Cryotherapy, Moist heat, Ultrasound, Manual therapy, and Re-evaluation  PLAN FOR NEXT SESSION: strengthening and stabilization with regards to MRI findings.  Pt is planning to have R shoulder surgery.  Discuss with pt gym precautions and appropriate exercises.    Selinda Michaels III PT, DPT 03/07/22 9:43 AM

## 2022-03-10 ENCOUNTER — Ambulatory Visit (INDEPENDENT_AMBULATORY_CARE_PROVIDER_SITE_OTHER): Payer: Medicare HMO | Admitting: Internal Medicine

## 2022-03-10 ENCOUNTER — Encounter: Payer: Self-pay | Admitting: Internal Medicine

## 2022-03-10 VITALS — BP 112/62 | HR 71 | Temp 98.4°F | Ht 67.0 in | Wt 192.0 lb

## 2022-03-10 DIAGNOSIS — R413 Other amnesia: Secondary | ICD-10-CM | POA: Diagnosis not present

## 2022-03-10 DIAGNOSIS — Z820 Family history of epilepsy and other diseases of the nervous system: Secondary | ICD-10-CM

## 2022-03-10 DIAGNOSIS — R7303 Prediabetes: Secondary | ICD-10-CM | POA: Diagnosis not present

## 2022-03-10 DIAGNOSIS — H9190 Unspecified hearing loss, unspecified ear: Secondary | ICD-10-CM

## 2022-03-10 DIAGNOSIS — G4733 Obstructive sleep apnea (adult) (pediatric): Secondary | ICD-10-CM | POA: Diagnosis not present

## 2022-03-10 DIAGNOSIS — H9193 Unspecified hearing loss, bilateral: Secondary | ICD-10-CM

## 2022-03-10 HISTORY — DX: Unspecified hearing loss, unspecified ear: H91.90

## 2022-03-10 NOTE — Assessment & Plan Note (Signed)
Counseled about diet and is seeing nutrition now so does not need new nutrition referral.

## 2022-03-10 NOTE — Progress Notes (Signed)
   Subjective:   Patient ID: Kelly [name], female    DOB: 03-31-1941, 81 y.o.   MRN: [id number]  HPI The patient is a 81 YO female coming in for ear wax removal and questions.   Review of Systems  Constitutional: Negative.   HENT: Negative.    Eyes: Negative.   Respiratory:  Negative for cough, chest tightness and shortness of breath.   Cardiovascular:  Negative for chest pain, palpitations and leg swelling.  Gastrointestinal:  Negative for abdominal distention, abdominal pain, constipation, diarrhea, nausea and vomiting.  Musculoskeletal: Negative.   Skin: Negative.   Neurological: Negative.   Psychiatric/Behavioral: Negative.      Objective:  Physical Exam Constitutional:      Appearance: She is well-developed.  HENT:     Head: Normocephalic and atraumatic.     Comments:  both ears canal impacted with copious hard wax, examination post ear lavage canal is clear and no bleeding or complications noted.   Cardiovascular:     Rate and Rhythm: Normal rate and regular rhythm.  Pulmonary:     Effort: Pulmonary effort is normal. No respiratory distress.     Breath sounds: Normal breath sounds. No wheezing or rales.  Abdominal:     General: Bowel sounds are normal. There is no distension.     Palpations: Abdomen is soft.     Tenderness: There is no abdominal tenderness. There is no rebound.  Musculoskeletal:     Cervical back: Normal range of motion.  Skin:    General: Skin is warm and dry.  Neurological:     Mental Status: She is alert and oriented to person, place, and time.     Coordination: Coordination normal.     Vitals:   03/10/22 1043  BP: 112/62  Pulse: 71  Temp: 98.4 F (36.9 C)  TempSrc: Oral  SpO2: 92%  Weight: 192 lb (87.1 kg)  Height: '5\' 7"'$  (1.702 m)    Assessment & Plan:  Visit time 15 minutes in face to face communication with patient and coordination of care, additional 5 minutes spent in record review, coordination or care, ordering tests,  communicating/referring to other healthcare professionals, documenting in medical records all on the same day of the visit for total time 20 minutes spent on the visit.

## 2022-03-10 NOTE — Assessment & Plan Note (Signed)
Ears irrigated today to clear wax and hearing aids replaced afterwards. She will monitor hearing and if no improvement return for hearing aid adjustment.

## 2022-03-10 NOTE — Assessment & Plan Note (Signed)
New CPAP ordered today as hers is >79 years old. She is using nightly and getting clinical benefit. Will need mask fitting with new machine.

## 2022-03-14 ENCOUNTER — Encounter: Payer: Self-pay | Admitting: Internal Medicine

## 2022-03-14 ENCOUNTER — Ambulatory Visit (HOSPITAL_BASED_OUTPATIENT_CLINIC_OR_DEPARTMENT_OTHER): Payer: Medicare HMO | Admitting: Physical Therapy

## 2022-03-14 ENCOUNTER — Encounter (HOSPITAL_BASED_OUTPATIENT_CLINIC_OR_DEPARTMENT_OTHER): Payer: Self-pay | Admitting: Physical Therapy

## 2022-03-14 DIAGNOSIS — M79672 Pain in left foot: Secondary | ICD-10-CM | POA: Diagnosis not present

## 2022-03-14 DIAGNOSIS — G8929 Other chronic pain: Secondary | ICD-10-CM | POA: Diagnosis not present

## 2022-03-14 DIAGNOSIS — M76822 Posterior tibial tendinitis, left leg: Secondary | ICD-10-CM | POA: Diagnosis not present

## 2022-03-14 DIAGNOSIS — M25512 Pain in left shoulder: Secondary | ICD-10-CM | POA: Diagnosis not present

## 2022-03-14 DIAGNOSIS — Z820 Family history of epilepsy and other diseases of the nervous system: Secondary | ICD-10-CM

## 2022-03-14 DIAGNOSIS — R413 Other amnesia: Secondary | ICD-10-CM

## 2022-03-14 DIAGNOSIS — M6281 Muscle weakness (generalized): Secondary | ICD-10-CM | POA: Diagnosis not present

## 2022-03-14 DIAGNOSIS — M25511 Pain in right shoulder: Secondary | ICD-10-CM | POA: Diagnosis not present

## 2022-03-14 NOTE — Therapy (Signed)
OUTPATIENT PHYSICAL THERAPY TREATMENT NOTE       Patient Name: Kelly [name] MRN: [id number] DOB:21-May-1940, 81 y.o., female Today's Date: 03/14/2022   PT End of Session - 03/14/22 1200     Visit Number 8    Number of Visits 10    Date for PT Re-Evaluation 03/27/22    Authorization Type AETNA MCR    PT Start Time 2820    PT Stop Time 1237    PT Time Calculation (min) 42 min    Activity Tolerance Patient tolerated treatment well    Behavior During Therapy WFL for tasks assessed/performed                  Past Medical History:  Diagnosis Date   Allergy    Blood transfusion without reported diagnosis    Breast cancer ([place])    Breast mass    fibocystic breast dx   Cataract    Family history of colon cancer 04/15/2020   Family history of pancreatic cancer 04/15/2020   Family history of prostate cancer 04/15/2020   Hepatitis    CMV 1992   Hyperlipidemia    Postmenopausal HRT (hormone replacement therapy)    Scarlet fever as a teen   Sleep apnea    Past Surgical History:  Procedure Laterality Date   APPENDECTOMY     BREAST LUMPECTOMY WITH RADIOACTIVE SEED LOCALIZATION Right 04/27/2020   Procedure: RIGHT BREAST LUMPECTOMY WITH RADIOACTIVE SEED LOCALIZATION;  Surgeon: [name], MD;  Location: [facility];  Service: General;  Laterality: Right;   BUNIONECTOMY     caesarean section     COLONOSCOPY     CORRECTION HAMMER TOE     FOOT TENDON SURGERY     left    POLYPECTOMY     TONSILLECTOMY     TONSILLECTOMY     Patient Active Problem List   Diagnosis Date Noted   Hearing loss 03/10/2022   Vitamin D deficiency 03/03/2022   Ankle impingement syndrome, 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/2021   Prediabetes 01/21/2021   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   Hepatic steatosis 09/10/2020   Genetic testing 04/26/2020   Malignant neoplasm of upper-outer quadrant of right breast in female, estrogen receptor positive ([place]) 04/05/2020   Degenerative disc disease, cervical 08/07/2019   Lumbar  radiculopathy 04/11/2017   Bunion of great toe of right foot 04/18/2016   OSA on CPAP 12/23/2015   Urinary incontinence 03/31/2014   Fibrocystic breast disease 06/06/2011   Routine health maintenance 11/12/2010   Obesity 09/11/2007   Hyperlipidemia 06/10/2007    PCP: Hoyt Koch, MD  REFERRING PROVIDER: Gregor Hams, MD  REFERRING DIAG: M25.511,G89.29,M25.512 (ICD-10-CM) - Chronic pain of both shoulders    THERAPY DIAG:  Chronic right shoulder pain  Chronic left shoulder pain  Muscle weakness (generalized)  Rationale for Evaluation and Treatment Rehabilitation  ONSET DATE: October 2022 Exacerbation  SUBJECTIVE:  SUBJECTIVE STATEMENT: -Pt reports improved lifting objects and is able to perform her normal functional lifting activities.  Pt is able to make her bed and load/unload the dishwasher without difficulty.  She has no problems with driving.      -Pt denies any adverse effects after prior Rx.   Pt reports compliance with HEP.  Pt has not returned to her gym workouts.  Pt denies shoulder pain at rest.  Pt reports 4/10 lateral proximal UE pain with reaching laterally.  Pt states she has some lateral R shoulder and lateral R prox UE pain with her exercise classes.    PERTINENT HISTORY: MRI and x ray findings of bilat shoulders Breast CA, hepatitis, L ankle impingement, L ankle tendon transfer surgery, L foot bunionectomy.       PRECAUTIONS: Other: MRI and x ray findings of bilat shoulders  WEIGHT BEARING RESTRICTIONS No  FALLS:  Has patient fallen in last 6 months? No   OCCUPATION: Pt is retired  PLOF: Independent; Pt was working out in [employer].  She was able to perform her daily activities and reaching activities independently without significant pain.   PATIENT GOALS  strengthen shoulders, wants to know safe exercises in the gym  OBJECTIVE:   DIAGNOSTIC FINDINGS:  Per MD note on 12/30/21: Xray (3 views right shoulder, 3 views left shoulder): There is moderate AC joint osteoarthritis bilaterally and well appearing glenohumeral joint bilaterally as well   MRI (right shoulder, left shoulder): Bilateral evidence of moderate to severe AC joint arthropathy with interstitial tearing of bilateral rotator cuff tears in the setting of impingement  MRI's per Epic: R shoulder: IMPRESSION: Severe tendinosis of the distal supraspinatus and infraspinatus tendons. Intermediate grade, rim rent type interstitial tearing of the supraspinatus tendon at the footprint. Mild distal subscapularis tendinosis with articular sided fraying. No significant supraspinatus, infraspinatus, or subscapularis atrophy.   Tendinosis and possible split tearing of the intra-articular long head biceps tendon.   Moderate glenohumeral and AC joint osteoarthritis.   Degenerative labral tearing as described above.  L shoulder: IMPRESSION: Severe tendinosis of the distal supraspinatus and infraspinatus tendons, with low-grade interstitial tearing of the supraspinatus tendon at the footprint. No retracted cuff tear. No significant muscle atrophy.   AC joint widening with pericapsular and joint edema, suggesting low-grade AC joint injury in the setting of underlying moderate AC joint osteoarthritis.   Mild glenohumeral osteoarthritis. Posterosuperior labral degeneration without displaced labral tear. X rays on 7/18: R shoulder:  IMPRESSION: Degenerative changes with no acute osseous abnormality identified. L shoulder:  IMPRESSION: No evidence for acute abnormality. Similar acromioclavicular degenerative changes.    TODAY'S TREATMENT:  Therapeutic Exercise: -Reviewed current function, response to prior Rx, HEP compliance, and pain level.  -PT reviewed her prior gym workout and  answered questions.  Educated pt on appropriate vs inappropriate exercises.  Instructed pt on machine set up.   -Pt performed: UBE 2 min 20 sec [employer] seated row (#12) 10# x10, 15# 2 x 10 Standing shoulder cable extension 5# 2x10 (setting #10 showing) Standing cable triceps extension 5# x 10, 10# x 10   Neuro Re-ed activities:  Supine rhythmic stabs 2x30 sec bilat each at 60, 90, and 120 deg flexion 4D ball rolls on wall 2 x 10 bilat      PATIENT EDUCATION: Education details:  Proper gym precautions/restrictions.  Instructed pt to start with very light weight.  exercise form, HEP, and POC.  PT answered pt's questions. Person educated: Patient Education method: Explanation,  tactile cues, verbal cues, demonstration Education comprehension: verbalized understanding and needs further education, returned demonstration, verbal and tactile cues required   HOME EXERCISE PROGRAM:   Access Code: [email or web address] URL: [email or web address] Prepared by: Selinda Michaels III PT    ASSESSMENT:  CLINICAL IMPRESSION: Pt began having lateral shoulder/proximal UE pain with UBE and PT had pt stop UBE.  PT reviewed with pt her prior gym routine and educated her with appropriate vs inappropriate exercises.  PT answered pt's questions and instructed her in proper gym precautions/restrictions.  Pt performed gym exercises with instruction in correct form, positioning, and set up.  Pt tolerated gym exercises with light resistance well.  Pt was fatigued with 4D ball rolls on wall.  Pt responded well to Rx having no pain after Rx.  She should benefit from cont skilled PT services to improve strength, stability, and function and to assist with establishing HEP/gym program.        OBJECTIVE IMPAIRMENTS decreased ROM, decreased strength, impaired UE functional use, and pain.   ACTIVITY LIMITATIONS carrying and lifting  PARTICIPATION LIMITATIONS:  workout activities  PERSONAL FACTORS  chronic pain   are also affecting patient's functional outcome.   REHAB POTENTIAL: Good  CLINICAL DECISION MAKING: Stable/uncomplicated  EVALUATION COMPLEXITY: Low   GOALS:   SHORT TERM GOALS: Target date:02/01/2022  Pt will be independent and compliant with HEP for improved pain, strength, and function.  Baseline: Goal status: GOAL MET   LONG TERM GOALS: Target date: 03/26/2022   Pt will demo good understanding of appropriate gym exercises and perform exercises without adverse effects for improved functional strength to improve carrying/lifting. Baseline:  Goal status: NOT MET  2.  Pt will be able to perform workout activities without significant pain.  Baseline:  Goal status: progressing  3.  Pt will be able to perform her normal functional lifting activities without significant pain or difficulty.  Baseline:  Goal status: GOAL MET  4.  Pt will demo improved bilat ER strength to 4+/5 and R IR strength to 5/5 MMT for improved stability in bilat shoulder for improved tolerance with and performance of functional lifting and carrying.  Baseline:  Goal status: ONGOING    PLAN: PT FREQUENCY: 1x/week  PT DURATION: other: 4weeks  PLANNED INTERVENTIONS: Therapeutic exercises, Therapeutic activity, Neuromuscular re-education, Patient/Family education, Self Care, Joint mobilization, Aquatic Therapy, Dry Needling, Electrical stimulation, Cryotherapy, Moist heat, Ultrasound, Manual therapy, and Re-evaluation  PLAN FOR NEXT SESSION: strengthening and stabilization with regards to MRI findings.  Pt is planning to have R shoulder surgery.  Review with pt gym precautions and appropriate exercises.    Selinda Michaels III PT, DPT 03/14/22 4:00 PM

## 2022-03-17 DIAGNOSIS — Z820 Family history of epilepsy and other diseases of the nervous system: Secondary | ICD-10-CM

## 2022-03-17 DIAGNOSIS — R413 Other amnesia: Secondary | ICD-10-CM | POA: Insufficient documentation

## 2022-03-17 HISTORY — DX: Family history of epilepsy and other diseases of the nervous system: Z82.0

## 2022-03-17 NOTE — Assessment & Plan Note (Signed)
Minimal and likely normal age related. Due to 2 younger siblings having alzheimer's and a parent will refer to neurology for testing for baseline.

## 2022-03-17 NOTE — Assessment & Plan Note (Signed)
Discussed with patient as 2 of her younger siblings have significant alzheimer's at this time she should have assessment with neurology. She is noticing minimal memory change.

## 2022-03-21 ENCOUNTER — Encounter (HOSPITAL_BASED_OUTPATIENT_CLINIC_OR_DEPARTMENT_OTHER): Payer: Self-pay | Admitting: Physical Therapy

## 2022-03-21 ENCOUNTER — Other Ambulatory Visit: Payer: Self-pay | Admitting: *Deleted

## 2022-03-21 ENCOUNTER — Ambulatory Visit (HOSPITAL_BASED_OUTPATIENT_CLINIC_OR_DEPARTMENT_OTHER): Payer: Medicare HMO | Attending: Family Medicine | Admitting: Physical Therapy

## 2022-03-21 ENCOUNTER — Inpatient Hospital Stay: Payer: Medicare HMO | Attending: Hematology and Oncology | Admitting: Hematology and Oncology

## 2022-03-21 ENCOUNTER — Encounter: Payer: Self-pay | Admitting: Hematology and Oncology

## 2022-03-21 ENCOUNTER — Other Ambulatory Visit: Payer: Self-pay

## 2022-03-21 VITALS — BP 106/65 | HR 74 | Temp 97.7°F | Wt 195.6 lb

## 2022-03-21 DIAGNOSIS — Z79811 Long term (current) use of aromatase inhibitors: Secondary | ICD-10-CM | POA: Insufficient documentation

## 2022-03-21 DIAGNOSIS — M6281 Muscle weakness (generalized): Secondary | ICD-10-CM | POA: Diagnosis not present

## 2022-03-21 DIAGNOSIS — Z170 Estrogen receptor positive status [ER+]: Secondary | ICD-10-CM | POA: Diagnosis not present

## 2022-03-21 DIAGNOSIS — Z800 Family history of malignant neoplasm of digestive organs: Secondary | ICD-10-CM | POA: Diagnosis not present

## 2022-03-21 DIAGNOSIS — C50411 Malignant neoplasm of upper-outer quadrant of right female breast: Secondary | ICD-10-CM | POA: Diagnosis not present

## 2022-03-21 DIAGNOSIS — G8929 Other chronic pain: Secondary | ICD-10-CM | POA: Insufficient documentation

## 2022-03-21 DIAGNOSIS — M25511 Pain in right shoulder: Secondary | ICD-10-CM | POA: Insufficient documentation

## 2022-03-21 DIAGNOSIS — M25512 Pain in left shoulder: Secondary | ICD-10-CM | POA: Diagnosis not present

## 2022-03-21 DIAGNOSIS — E119 Type 2 diabetes mellitus without complications: Secondary | ICD-10-CM | POA: Diagnosis not present

## 2022-03-21 DIAGNOSIS — Z0000 Encounter for general adult medical examination without abnormal findings: Secondary | ICD-10-CM
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
## 2022-03-21 NOTE — Therapy (Signed)
OUTPATIENT PHYSICAL THERAPY TREATMENT NOTE       Patient Name: Kelly [name] MRN: [id number] DOB:11/18/1940, 81 y.o., female Today's Date: 03/22/2022   PT End of Session - 03/21/22        Visit Number 9    Number of Visits 10     Date for PT Re-Evaluation 03/27/22     Authorization Type AET[employer] MCR     PT Start Time 1154    PT Stop Time 1234    PT Time Calculation (min) 40 min     Activity Tolerance Patient tolerated treatment well     Behavior During Therapy WFL for tasks assessed/performed           Past Medical History:  Diagnosis Date   Allergy    Blood transfusion without reported diagnosis    Breast cancer (HCC)    Breast mass    fibocystic breast dx   Cataract    Family history of colon cancer 04/15/2020   Family history of pancreatic cancer 04/15/2020   Family history of prostate cancer 04/15/2020   Hepatitis    CMV 1992   Hyperlipidemia    Postmenopausal HRT (hormone replacement therapy)    Scarlet fever as a teen   Sleep apnea    Past Surgical History:  Procedure Laterality Date   APPENDECTOMY     BREAST LUMPECTOMY WITH RADIOACTIVE SEED LOCALIZATION Right 04/27/2020   Procedure: RIGHT BREAST LUMPECTOMY WITH RADIOACTIVE SEED LOCALIZATION;  Surgeon: Blackman, Douglas, MD;  Location: [facility] OR;  Service: General;  Laterality: Right;   BUNIONECTOMY     caesarean section     COLONOSCOPY     CORRECTION HAMMER TOE     FOOT TENDON SURGERY     left    POLYPECTOMY     TONSILLECTOMY     TONSILLECTOMY     Patient Active Problem List   Diagnosis Date Noted   Memory change 03/17/2022   Family history of Alzheimer's disease 03/17/2022   Hearing loss 03/10/2022   Vitamin D deficiency 03/03/2022   Ankle impingement syndrome, left 05/24/2021   Prediabetes 01/21/2021   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   Hepatic steatosis 09/10/2020   Genetic testing 04/26/2020   Malignant neoplasm of upper-outer quadrant of right breast in female, estrogen receptor positive (HCC)  04/05/2020   Degenerative disc disease, cervical 08/07/2019   Lumbar radiculopathy 04/11/2017   Bunion of great toe of right foot 04/18/2016   OSA on CPAP 12/23/2015   Urinary incontinence 03/31/2014   Fibrocystic breast disease 06/06/2011   Routine health maintenance 11/12/2010   Obesity 09/11/2007   Hyperlipidemia 06/10/2007    PCP: Crawford, Elizabeth A, MD  REFERRING PROVIDER: Corey, Evan S, MD  REFERRING DIAG: M25.511,G89.29,M25.512 (ICD-10-CM) - Chronic pain of both shoulders    THERAPY DIAG:  Chronic right shoulder pain  Chronic left shoulder pain  Muscle weakness (generalized)  Rationale for Evaluation and Treatment Rehabilitation  ONSET DATE: October 2022 Exacerbation  SUBJECTIVE:                                                                                                                                                                                        SUBJECTIVE STATEMENT: Pt states she doesn't remember how she felt after prior Rx though thinks she would remember if she had increased pain.  Pt states she went to chair yoga 4 times last week.  She states she has good ROM in shoulders except some pain and difficulty with reaching across body.  Pt denies pain currently in bilat shoulders.    PERTINENT HISTORY: MRI and x ray findings of bilat shoulders Breast CA, hepatitis, L ankle impingement, L ankle tendon transfer surgery, L foot bunionectomy.       PRECAUTIONS: Other: MRI and x ray findings of bilat shoulders  WEIGHT BEARING RESTRICTIONS No  FALLS:  Has patient fallen in last 6 months? No   OCCUPATION: Pt is retired  PLOF: Independent; Pt was working out in [employer].  She was able to perform her daily activities and reaching activities independently without significant pain.   PATIENT GOALS strengthen shoulders, wants to know safe exercises in the gym  OBJECTIVE:   DIAGNOSTIC FINDINGS:  Per MD note on 12/30/21: Xray (3 views right shoulder, 3  views left shoulder): There is moderate AC joint osteoarthritis bilaterally and well appearing glenohumeral joint bilaterally as well   MRI (right shoulder, left shoulder): Bilateral evidence of moderate to severe AC joint arthropathy with interstitial tearing of bilateral rotator cuff tears in the setting of impingement  MRI's per Epic: R shoulder: IMPRESSION: Severe tendinosis of the distal supraspinatus and infraspinatus tendons. Intermediate grade, rim rent type interstitial tearing of the supraspinatus tendon at the footprint. Mild distal subscapularis tendinosis with articular sided fraying. No significant supraspinatus, infraspinatus, or subscapularis atrophy.   Tendinosis and possible split tearing of the intra-articular long head biceps tendon.   Moderate glenohumeral and AC joint osteoarthritis.   Degenerative labral tearing as described above.  L shoulder: IMPRESSION: Severe tendinosis of the distal supraspinatus and infraspinatus tendons, with low-grade interstitial tearing of the supraspinatus tendon at the footprint. No retracted cuff tear. No significant muscle atrophy.   AC joint widening with pericapsular and joint edema, suggesting low-grade AC joint injury in the setting of underlying moderate AC joint osteoarthritis.   Mild glenohumeral osteoarthritis. Posterosuperior labral degeneration without displaced labral tear. X rays on 7/18: R shoulder:  IMPRESSION: Degenerative changes with no acute osseous abnormality identified. L shoulder:  IMPRESSION: No evidence for acute abnormality. Similar acromioclavicular degenerative changes.    TODAY'S TREATMENT:  Therapeutic Exercise: -Reviewed response to prior Rx and pain level.  -PT reviewed appropriate vs inappropriate exercises and answered questions.  Instructed pt on machine set up.   -Pt performed: UBE 3 min  [employer] seated row (#12) (seat at 3)   (pad at 3)   10# 2x10, 15# 1 x 10 Standing  shoulder cable extension 5# 3x10 (setting #10 showing) Standing cable triceps extension 5# x 10, 10# x 10 4D ball rolls on wall 2 x 10 bilat    PT educated pt with proper set up of gym equipment and instructed pt in proper set up.  PT wrote down specific set up parameters, appropriate frequency and sets/reps, and appropriate resistance and gave pt a handout.  PT extensively went through handout with pt.   PATIENT EDUCATION: Education details:  Proper gym precautions/restrictions.  Instructed pt to start with very light weight.  exercise form, HEP, and POC.  PT answered pt's questions. Person educated: Patient Education method: Explanation, tactile cues, verbal cues, demonstration Education comprehension: verbalized understanding and needs further education, returned demonstration, verbal and tactile cues required  HOME EXERCISE PROGRAM:   Access Code: [email or web address] URL: [email or web address] Prepared by:   III PT  Updated HEP: [employer] seated row 2-3 sets of 10 reps, 10 or 15 lbs Standing Shoulder cable extension, 2-3 sets of 10 reps, 5 lbs Standing cable triceps extension, 2 sets of 10 reps 5 or 10 lbs    ASSESSMENT:  CLINICAL IMPRESSION: PT extensively went through a gym program with appropriate exercises and gave pt a handout.  She is going to try the workout before she returns to the next PT Rx.  PT answered all questions and pt demonstrated good understanding.  Pt performed UBE without c/o's.  Pt required much cuing for correct form with 4D ball rolls on wall.  Pt had no c/o's of increased pain during gym exercises.  She responded well to Rx having no pain after Rx.  Plan to discharge pt next Rx.       OBJECTIVE IMPAIRMENTS decreased ROM, decreased strength, impaired UE functional use, and pain.   ACTIVITY LIMITATIONS carrying and lifting  PARTICIPATION LIMITATIONS:  workout activities  PERSONAL FACTORS  chronic pain  are also affecting patient's  functional outcome.   REHAB POTENTIAL: Good  CLINICAL DECISION MAKING: Stable/uncomplicated  EVALUATION COMPLEXITY: Low   GOALS:   SHORT TERM GOALS: Target date:02/01/2022  Pt will be independent and compliant with HEP for improved pain, strength, and function.  Baseline: Goal status: GOAL MET   LONG TERM GOALS: Target date: 03/26/2022   Pt will demo good understanding of appropriate gym exercises and perform exercises without adverse effects for improved functional strength to improve carrying/lifting. Baseline:  Goal status: NOT MET  2.  Pt will be able to perform workout activities without significant pain.  Baseline:  Goal status: progressing  3.  Pt will be able to perform her normal functional lifting activities without significant pain or difficulty.  Baseline:  Goal status: GOAL MET  4.  Pt will demo improved bilat ER strength to 4+/5 and R IR strength to 5/5 MMT for improved stability in bilat shoulder for improved tolerance with and performance of functional lifting and carrying.  Baseline:  Goal status: ONGOING    PLAN: PT FREQUENCY: 1x/week  PT DURATION: other: 4weeks  PLANNED INTERVENTIONS: Therapeutic exercises, Therapeutic activity, Neuromuscular re-education, Patient/Family education, Self Care, Joint mobilization, Aquatic Therapy, Dry Needling, Electrical stimulation, Cryotherapy, Moist heat, Ultrasound, Manual therapy, and Re-evaluation  PLAN FOR NEXT SESSION:  Review HEP and gym program.  Probable discharge next Rx.  Assess response to gym program.      III PT, DPT 03/22/22 8:54 PM  

## 2022-03-24 ENCOUNTER — Other Ambulatory Visit (INDEPENDENT_AMBULATORY_CARE_PROVIDER_SITE_OTHER): Payer: Medicare HMO

## 2022-03-24 ENCOUNTER — Encounter: Payer: Self-pay | Admitting: Physician Assistant

## 2022-03-24 ENCOUNTER — Ambulatory Visit: Payer: Medicare HMO | Admitting: Physician Assistant

## 2022-03-24 VITALS — BP 110/73 | HR 92 | Resp 20 | Ht 65.0 in | Wt 198.0 lb

## 2022-03-24 DIAGNOSIS — G3184 Mild cognitive impairment, so stated: Secondary | ICD-10-CM

## 2022-03-24 LAB — VITAMIN B12: Vitamin B-12: 568 pg/mL (ref 211–911)

## 2022-03-24 LAB — TSH: TSH: 1.07 u[IU]/mL (ref 0.35–5.50)

## 2022-03-24 NOTE — Progress Notes (Cosign Needed Addendum)
Assessment/Plan:    The patient is seen in neurologic consultation at the request of Hoyt Koch, * for the evaluation of memory.  Kelly [name] is a very pleasant 81 y.o. year old RH female with  a history of hypertension, hyperlipidemia, breast cancer, OSA on CPAP, arthritis, DM2, Vit D deficiency,  seen today for evaluation of memory loss. MoCA today is  25/30, work-up is currently in progress.  Mild cognitive impairment of unknown etiology.  MRI brain without contrast to assess for underlying structural abnormality and assess vascular load  Neurocognitive testing to further evaluate cognitive concerns and determine other underlying cause of memory changes, including potential contribution from sleep, anxiety, or depression  Check B12, TSH Folllow up  in 1 month to go over the MRI brain results   Subjective:    The patient is here alone    How long did patient have memory difficulties?" I am here because my family has a strong history of dementia". Denies any issues such as difficulty remembering people's names or retaining new information or recent conversations. She does not do crossword puzzles and word finding.  She tries to stay busy, and enjoys playing bridge frequently, meeting different groups and attending church . repeats oneself? Denies  Disoriented when walking into a room?  Patient denies   Leaving objects in unusual places?  Patient denies   Patient lives her roommate for 21 yrs  Ambulates  with difficulty?  She had decreased mobility on the L foot with recent tendinitis, and bilateral shoulder arthritis. She does Chair yoga, walks 14 laps daily, and balance and flexibility classes. Going to try water aerobics  Recent falls?  Patient denies   Any head injuries?  Patient denies   History of seizures?   Patient denies   Wandering behavior?  Patient denies   Patient drives?   Only during the day, because of cataracts  Any mood changes ?  denies Any  depression?:  Patient denies   Hallucinations?  Patient denies   Paranoia?  Patient denies   Patient reports that sleeps well without vivid dreams, REM behavior or sleepwalking.  "When I sleep I sleep "    History of sleep apnea?  Endorsed, uses CPAP x 6 years  Any hygiene concerns?  Patient denies   Independent of bathing and dressing?  Endorsed  Does the patient needs help with medications? Patient in charge  Who is in charge of the finances? Patient  is in charge   Any changes in appetite?  Patient denies   Patient have trouble swallowing? Patient denies   Does the patient cook?  Patient denies   Any kitchen accidents such as leaving the stove on? Patient denies   Any headaches?  Patient denies   Double vision? Patient denies   Any focal numbness or tingling?  Patient denies   Chronic back pain Patient denies   Unilateral weakness?  Patient denies   Any tremors?  Patient denies   Any history of anosmia?  Patient denies   Any incontinence of urine?  Patient denies   Any bowel dysfunction?   Patient denies   History of heavy alcohol intake?  Patient denies   History of heavy tobacco use?  Patient denies   Family history of dementia?   Both parents had dementia, possibly Alzheimer's, and her sister and brother ( a retired Engineer, drilling in town)  has been diagnosed with AD. Graduate degree, retired from [employer] planning  Pertinent Labs October 2023 A1C 6.6, vitamin D 35.1 LDL 98, normal CBC  Allergies  Allergen Reactions   Amoxicillin Rash   Codeine Nausea Only    Current Outpatient Medications  Medication Instructions   anastrozole (ARIMIDEX) 1 mg, Oral, Daily   atorvastatin (LIPITOR) 40 MG tablet TAKE 1 TABLET BY MOUTH EVERY DAY   Calcium Citrate (CITRACAL PO) 2 tablets, Oral, Daily   cholecalciferol (VITAMIN D3) 1,000 Units, Oral, Daily   CoQ10 200 mg, Oral, Daily   Fish Oil 1,200 mg, Oral, Daily   methocarbamol (ROBAXIN) 500 mg, 4 times  daily   minoxidil (LONITEN) 1.25 mg, Oral, Daily   Multiple Vitamins-Minerals (SPECTRAVITE) TABS 1 tablet, Oral, Daily     VITALS:   Vitals:   03/24/22 0751  BP: 110/73  Pulse: 92  Resp: 20  SpO2: 95%  Weight: 198 lb (89.8 kg)  Height: '5\' 5"'$  (1.651 m)    PHYSICAL EXAM   HEENT:  Normocephalic, atraumatic. The mucous membranes are moist. The superficial temporal arteries are without ropiness or tenderness. Cardiovascular: Regular rate and rhythm. Lungs: Clear to auscultation bilaterally. Neck: There are no carotid bruits noted bilaterally.  NEUROLOGICAL:    03/24/2022    8:00 AM  Montreal Cognitive Assessment   Visuospatial/ Executive (0/5) 4  Naming (0/3) 2  Attention: Read list of digits (0/2) 2  Attention: Read list of letters (0/1) 1  Attention: Serial 7 subtraction starting at 100 (0/3) 3  Language: Repeat phrase (0/2) 2  Language : Fluency (0/1) 1  Abstraction (0/2) 2  Delayed Recall (0/5) 2  Orientation (0/6) 6  Total 25  Adjusted Score (based on education) 25       05/13/2018    3:28 PM  MMSE - Mini Mental State Exam  Orientation to time 5  Orientation to Place 5  Registration 3  Attention/ Calculation 5  Recall 3  Language- name 2 objects 2  Language- repeat 1  Language- follow 3 step command 3  Language- read & follow direction 1  Write a sentence 1  Copy design 1  Total score 30     Orientation:  Alert and oriented to person, place and time. No aphasia or dysarthria. Fund of knowledge is appropriate. Recent memory impaired and remote memory intact. Attention and concentration are normal.  Able to name objects and repeat phrases. Delayed recall 2/5 Cranial nerves: There is good facial symmetry. Extraocular muscles are intact and visual fields are full to confrontational testing. Speech is fluent and clear. Soft palate rises symmetrically and there is no tongue deviation. Hearing is intact to conversational tone. Tone: Tone is good  throughout. Sensation: Sensation is intact to light touch and pinprick throughout. Vibration is intact at the bilateral big toe.There is no extinction with double simultaneous stimulation. There is no sensory dermatomal level identified. Coordination: The patient has no difficulty with RAM's or FNF bilaterally. Normal finger to nose  Motor: Strength is 5/5 in the bilateral upper and lower extremities. There is no pronator drift. There are no fasciculations noted. DTR's: Deep tendon reflexes are 2/4 at the bilateral biceps, triceps, brachioradialis, patella and achilles.  Plantar responses are downgoing bilaterally. Gait and Station: The patient is able to ambulate without difficulty.The patient is able to ambulate in a tandem fashion, able to stand in the Romberg position.     Thank you for allowing [place] the opportunity to participate in the care of this nice patient. Please do not hesitate to contact us for any questions  or concerns.   Total time spent on today's visit was 42 minutes dedicated to this patient today, preparing to see patient, examining the patient, ordering tests and/or medications and counseling the patient, documenting clinical information in the EHR or other health record, independently interpreting results and communicating results to the patient/family, discussing treatment and goals, answering patient's questions and coordinating care.  Cc:  Hoyt Koch, MD  Sharene Butters 03/24/2022 8:53 AM

## 2022-03-24 NOTE — Progress Notes (Signed)
Thyroid and B12 levels are normal, thanks

## 2022-03-24 NOTE — Patient Instructions (Addendum)
It was a pleasure to see you today at our office.   Recommendations:  Follow up in 1  month  MRI brain   Neurocognitive testing Check labs     Whom to call:  Memory  decline, memory medications: Call our office [phone]   For psychiatric meds, mood meds: Please have your primary care physician manage these medications.     RECOMMENDATIONS FOR ALL PATIENTS WITH MEMORY PROBLEMS: 1. Continue to exercise (Recommend 30 minutes of walking everyday, or 3 hours every week) 2. Increase social interactions - continue going to [place] and enjoy social gatherings with friends and family 3. Eat healthy, avoid fried foods and eat more fruits and vegetables 4. Maintain adequate blood pressure, blood sugar, and blood cholesterol level. Reducing the risk of stroke and cardiovascular disease also helps promoting better memory. 5. Avoid stressful situations. Live a simple life and avoid aggravations. Organize your time and prepare for the next day in anticipation. 6. Sleep well, avoid any interruptions of sleep and avoid any distractions in the bedroom that may interfere with adequate sleep quality 7. Avoid sugar, avoid sweets as there is a strong link between excessive sugar intake, diabetes, and cognitive impairment We discussed the Mediterranean diet, which has been shown to help patients reduce the risk of progressive memory disorders and reduces cardiovascular risk. This includes eating fish, eat fruits and green leafy vegetables, nuts like almonds and hazelnuts, walnuts, and also use olive oil. Avoid fast foods and fried foods as much as possible. Avoid sweets and sugar as sugar use has been linked to worsening of memory function.  There is always a concern of gradual progression of memory problems. If this is the case, then we may need to adjust level of care according to patient needs. Support, both to the patient and caregiver, should then be put into place.    FALL PRECAUTIONS: Be cautious  when walking. Scan the area for obstacles that may increase the risk of trips and falls. When getting up in the mornings, sit up at the edge of the bed for a few minutes before getting out of bed. Consider elevating the bed at the head end to avoid drop of blood pressure when getting up. Walk always in a well-lit room (use night lights in the walls). Avoid area rugs or power cords from appliances in the middle of the walkways. Use a walker or a cane if necessary and consider physical therapy for balance exercise. Get your eyesight checked regularly.  FINANCIAL OVERSIGHT: Supervision, especially oversight when making financial decisions or transactions is also recommended.  HOME SAFETY: Consider the safety of the kitchen when operating appliances like stoves, microwave oven, and blender. Consider having supervision and share cooking responsibilities until no longer able to participate in those. Accidents with firearms and other hazards in the house should be identified and addressed as well.   ABILITY TO BE LEFT ALONE: If patient is unable to contact 911 operator, consider using LifeLine, or when the need is there, arrange for someone to stay with patients. Smoking is a fire hazard, consider supervision or cessation. Risk of wandering should be assessed by caregiver and if detected at any point, supervision and safe proof recommendations should be instituted.  MEDICATION SUPERVISION: Inability to self-administer medication needs to be constantly addressed. Implement a mechanism to ensure safe administration of the medications.   DRIVING: Regarding driving, in patients with progressive memory problems, driving will be impaired. We advise to have someone else do the driving if  trouble finding directions or if minor accidents are reported. Independent driving assessment is available to determine safety of driving.   If you are interested in the driving assessment, you can contact the following:  [facility]  [facility] in [place]  [facility] [phone]  [place] [phone]  We have sent a referral to [facility] for your MRI and they will call you directly to schedule your appointment. They are located at [place]. If you need to contact them directly please call [phone].  Your provider has requested that you have labwork completed today. Please go to [facility] ([address]) on the second floor of this building before leaving the office today. You do not need to check in. If you are not called within 15 minutes please check with the front desk.  [facility] [phone] or [phone]

## 2022-03-27 DIAGNOSIS — M76822 Posterior tibial tendinitis, left leg: Secondary | ICD-10-CM | POA: Diagnosis not present

## 2022-03-27 DIAGNOSIS — M79672 Pain in left foot: Secondary | ICD-10-CM | POA: Diagnosis not present

## 2022-03-27 NOTE — Therapy (Signed)
OUTPATIENT PHYSICAL THERAPY TREATMENT NOTE / DISCHARGE SUMMARY  Progress Note Reporting Period 01/11/22 to 03/28/2022  See note below for Objective Data and Assessment of Progress/Goals.          Patient Name: Kelly [name] MRN: [id number] DOB:Aug 01, 1940, 81 y.o., female Today's Date: 03/28/2022   PT End of Session - 03/28/22 0858     Visit Number 10    Number of Visits 10    Authorization Type AETNA MCR    PT Start Time 5697    PT Stop Time 0935    PT Time Calculation (min) 43 min    Activity Tolerance Patient tolerated treatment well    Behavior During Therapy [facility] for tasks assessed/performed                 Past Medical History:  Diagnosis Date   Allergy    Blood transfusion without reported diagnosis    Breast cancer ([place])    Breast mass    fibocystic breast dx   Cataract    Family history of colon cancer 04/15/2020   Family history of pancreatic cancer 04/15/2020   Family history of prostate cancer 04/15/2020   Hepatitis    CMV 1992   Hyperlipidemia    Postmenopausal HRT (hormone replacement therapy)    Scarlet fever as a teen   Sleep apnea    Past Surgical History:  Procedure Laterality Date   APPENDECTOMY     BREAST LUMPECTOMY WITH RADIOACTIVE SEED LOCALIZATION Right 04/27/2020   Procedure: RIGHT BREAST LUMPECTOMY WITH RADIOACTIVE SEED LOCALIZATION;  Surgeon: [name], MD;  Location: [facility];  Service: General;  Laterality: Right;   BUNIONECTOMY     caesarean section     COLONOSCOPY     CORRECTION HAMMER TOE     FOOT TENDON SURGERY     left    POLYPECTOMY     TONSILLECTOMY     TONSILLECTOMY     Patient Active Problem List   Diagnosis Date Noted   Memory change 03/17/2022   Family history of Alzheimer's disease 03/17/2022   Hearing loss 03/10/2022   Vitamin D deficiency 03/03/2022   Ankle impingement syndrome, left 05/24/2021   Prediabetes 01/21/2021   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   Hepatic steatosis 09/10/2020   Genetic  testing 04/26/2020   Malignant neoplasm of upper-outer quadrant of right breast in female, estrogen receptor positive ([place]) 04/05/2020   Degenerative disc disease, cervical 08/07/2019   Lumbar radiculopathy 04/11/2017   Bunion of great toe of right foot 04/18/2016   OSA on CPAP 12/23/2015   Urinary incontinence 03/31/2014   Fibrocystic breast disease 06/06/2011   Routine health maintenance 11/12/2010   Obesity 09/11/2007   Hyperlipidemia 06/10/2007    PCP: Hoyt Koch, MD  REFERRING PROVIDER: Gregor Hams, MD  REFERRING DIAG: M25.511,G89.29,M25.512 (ICD-10-CM) - Chronic pain of both shoulders    THERAPY DIAG:  Chronic right shoulder pain  Chronic left shoulder pain  Muscle weakness (generalized)  Rationale for Evaluation and Treatment Rehabilitation  ONSET DATE: October 2022 Exacerbation  SUBJECTIVE:  SUBJECTIVE STATEMENT: Pt states she felt fine after prior Rx.  "It's a big improvement since I came in."  Pt has performed the gym workout once and felt fine during workout and afterwards.  Pt has been performing her workout classes.  She states she has good ROM in shoulders except some pain and difficulty with reaching across body.  Pt denies pain at rest and with reaching.  Worst Pain:  3-4/10 on R, 3/10 on L.   Pt reports compliance with HEP.    PERTINENT HISTORY: MRI and x ray findings of bilat shoulders Breast CA, hepatitis, L ankle impingement, L ankle tendon transfer surgery, L foot bunionectomy.       PRECAUTIONS: Other: MRI and x ray findings of bilat shoulders  WEIGHT BEARING RESTRICTIONS No  FALLS:  Has patient fallen in last 6 months? No   OCCUPATION: Pt is retired  PLOF: Independent; Pt was working out in [employer].  She was able to perform her daily activities and  reaching activities independently without significant pain.   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 Intermediate grade, rim rent type interstitial tearing of the supraspinatus tendon at the footprint. Mild distal subscapularis tendinosis with articular sided fraying. No significant supraspinatus, infraspinatus, or subscapularis atrophy.   Tendinosis and possible split tearing of the intra-articular long head biceps tendon.   Moderate glenohumeral and AC joint osteoarthritis.   Degenerative labral tearing as described above.  L shoulder: IMPRESSION: Severe tendinosis of the distal supraspinatus and infraspinatus tendons, with low-grade interstitial tearing of the supraspinatus tendon at the footprint. No retracted cuff tear. No significant muscle atrophy.   AC joint widening with pericapsular and joint edema, suggesting low-grade AC joint injury in the setting of underlying moderate AC joint osteoarthritis.   Mild glenohumeral osteoarthritis. Posterosuperior labral degeneration without displaced labral tear. X rays on 7/18: R shoulder:  IMPRESSION: Degenerative changes with no acute osseous abnormality identified. L shoulder:  IMPRESSION: No evidence for acute abnormality. Similar acromioclavicular degenerative changes.    TODAY'S TREATMENT:  Therapeutic Exercise: -Reviewed current function, response to prior  Rx, HEP compliance, and pain level.  -See below for pt education.   PATIENT SURVEYS:  FOTO Initial/Prior/Current:  58/54/63 with a goal of 61 at visit #10     UPPER EXTREMITY ROM:    Active ROM Right eval Left eval Right 10/16 Left 10/16 Right 11/14 Left 11/14  Shoulder flexion 168 168 163 168 165 168  Shoulder scaption 164 160 157 158 154 165  Shoulder abduction 138 with pain 148 140 without pain 135 146 155  Shoulder adduction            Shoulder internal rotation 70 74 74 72    Shoulder external rotation 70 70 63 69 60 65  Elbow flexion            Elbow extension            Wrist flexion  Wrist extension            Wrist ulnar deviation            Wrist radial deviation            Wrist pronation            Wrist supination            (Blank rows = not tested)       UPPER EXTREMITY MMT:   MMT Right eval Left eval Right 10/16 Left 10/16 Right 11/14 Left 11/14  Shoulder flexion 5/5 5/5 4+/5 5/5 5/5 5/5  Shoulder scaption 4+/5 5/5 5/5 w/n available ROM 5/5    Shoulder abduction 4-/5 4+/5 4/5 4+/5 5/5 5/5  Shoulder adduction            Shoulder internal rotation 4+/5 5/5 4+/5 5/5 5/5 5/5  Shoulder external rotation 4/5 4/5 4/5 4/5 4/5 4/5  Middle trapezius            Lower trapezius            Elbow flexion            Elbow extension            Wrist flexion            Wrist extension            Wrist ulnar deviation            Wrist radial deviation            Wrist pronation            Wrist supination            Grip strength (lbs)            (Blank rows = not tested)     -PT reviewed gym program and pt performed gym program.  PT educated pt in correct set up, sets/reps, and appropriate resistance and wrote it down on her workout sheet. -PT answered pt's questions  -Pt performed: [employer] seated row (#12) (seat at 3)   (pad at 3)   10# x10, 15# 2 x 10 Standing shoulder cable extension 5# 2x10 (setting #10 showing) Standing cable  triceps extension 10# 2 x 10 ER/IR with arm at side with RTB 2x10 bilat   PT extensively went through HEP and gym program with pt.   PATIENT EDUCATION: Education details:  Gym program, HEP, exercise form, machine set up, and POC.   Person educated: Patient Education method: Explanation, tactile cues, verbal cues, demonstration Education comprehension: verbalized understanding and needs further education, returned demonstration, verbal and tactile cues required   HOME EXERCISE PROGRAM:   Access Code: [email or web address] URL: [email or web address] Prepared by: Selinda Michaels III PT   ASSESSMENT:  CLINICAL IMPRESSION: Pt has made great progress in PT.  She performed her gym program without adverse effects and without c/o's last week.  Pt has returned to workout classes without adverse effects.  PT extensively went through gym program and HEP today and instructed pt in correct form, appropriate frequency, and appropriate resistance.  Pt demonstrates good understanding of gym exercises and HEP.  PT answered all questions and pt demonstrated good understanding.  Pt demonstrates improved bilat shoulder AROM and bilat shoulder strength.  Pt has decreased bilat shoulder ER AROM though still functional.  Pt has 5/5 shoulder strength t/o bilat shoulders except weakness in bilat ER having 4/5 MMT. Pt's FOTO score improved from 54 prior to 63  currently.  She met her FOTO goal.  Pt has met all goals except ER strength.  Pt is ready for discharge.      OBJECTIVE IMPAIRMENTS decreased ROM, decreased strength, impaired UE functional use, and pain.   ACTIVITY LIMITATIONS carrying and lifting  PARTICIPATION LIMITATIONS:  workout activities  PERSONAL FACTORS  chronic pain  are also affecting patient's functional outcome.   REHAB POTENTIAL: Good  CLINICAL DECISION MAKING: Stable/uncomplicated  EVALUATION COMPLEXITY: Low   GOALS:   SHORT TERM GOALS: Target date:02/01/2022  Pt will be  independent and compliant with HEP for improved pain, strength, and function.  Baseline: Goal status: GOAL MET   LONG TERM GOALS: Target date: 03/26/2022   Pt will demo good understanding of appropriate gym exercises and perform exercises without adverse effects for improved functional strength to improve carrying/lifting. Baseline:  Goal status: GOAL MET  2.  Pt will be able to perform workout activities without significant pain.  Baseline:  Goal status: GOAL MET  3.  Pt will be able to perform her normal functional lifting activities without significant pain or difficulty.  Baseline:  Goal status: GOAL MET  4.  Pt will demo improved bilat ER strength to 4+/5 and R IR strength to 5/5 MMT for improved stability in bilat shoulder for improved tolerance with and performance of functional lifting and carrying.  Baseline:  Goal status: 80% MET    PLAN: PT FREQUENCY:   1 visit  PT DURATION:   PLANNED INTERVENTIONS: Therapeutic exercises, Therapeutic activity, Neuromuscular re-education, Patient/Family education, Self Care, Joint mobilization, Aquatic Therapy, Dry Needling, Electrical stimulation, Cryotherapy, Moist heat, Ultrasound, Manual therapy, and Re-evaluation  PLAN FOR NEXT SESSION:  Pt to be discharged from skilled PT services due to meeting all goals except ER strength.  Pt is independent with HEP and gym program.  She is agreeable with HEP.    PHYSICAL THERAPY DISCHARGE SUMMARY  Visits from Start of Care: 10  Current functional level related to goals / functional outcomes: See above   Remaining deficits: See above   Education / Equipment: See above   Patient agrees to discharge.    Selinda Michaels III PT, DPT 03/28/22 4:36 PM

## 2022-03-28 ENCOUNTER — Ambulatory Visit (HOSPITAL_BASED_OUTPATIENT_CLINIC_OR_DEPARTMENT_OTHER): Payer: Medicare HMO | Admitting: Physical Therapy

## 2022-03-28 ENCOUNTER — Encounter (HOSPITAL_BASED_OUTPATIENT_CLINIC_OR_DEPARTMENT_OTHER): Payer: Self-pay | Admitting: Physical Therapy

## 2022-03-28 DIAGNOSIS — M25512 Pain in left shoulder: Secondary | ICD-10-CM | POA: Diagnosis not present

## 2022-03-28 DIAGNOSIS — M25511 Pain in right shoulder: Secondary | ICD-10-CM | POA: Diagnosis not present

## 2022-03-28 DIAGNOSIS — G8929 Other chronic pain: Secondary | ICD-10-CM | POA: Diagnosis not present

## 2022-03-28 DIAGNOSIS — M6281 Muscle weakness (generalized): Secondary | ICD-10-CM

## 2022-03-29 ENCOUNTER — Other Ambulatory Visit: Payer: Medicare HMO

## 2022-03-29 ENCOUNTER — Encounter: Payer: Self-pay | Admitting: Internal Medicine

## 2022-03-29 DIAGNOSIS — M67962 Unspecified disorder of synovium and tendon, left lower leg: Secondary | ICD-10-CM | POA: Diagnosis not present

## 2022-03-29 DIAGNOSIS — M25872 Other specified joint disorders, left ankle and foot: Secondary | ICD-10-CM | POA: Diagnosis not present

## 2022-03-30 DIAGNOSIS — Z23 Encounter for immunization: Secondary | ICD-10-CM | POA: Diagnosis not present

## 2022-04-15 ENCOUNTER — Ambulatory Visit
Admission: RE | Admit: 2022-04-15 | Discharge: 2022-04-15 | Disposition: A | Discharge status: Home / Self Care | Payer: Medicare HMO | Source: Ambulatory Visit | Attending: Physician Assistant | Admitting: Physician Assistant

## 2022-04-15 DIAGNOSIS — I6782 Cerebral ischemia: Secondary | ICD-10-CM | POA: Diagnosis not present

## 2022-04-15 DIAGNOSIS — R413 Other amnesia: Secondary | ICD-10-CM | POA: Diagnosis not present

## 2022-04-15 DIAGNOSIS — J3489 Other specified disorders of nose and nasal sinuses: Secondary | ICD-10-CM | POA: Diagnosis not present

## 2022-04-18 DIAGNOSIS — E119 Type 2 diabetes mellitus without complications: Secondary | ICD-10-CM | POA: Diagnosis not present

## 2022-04-21 ENCOUNTER — Other Ambulatory Visit: Payer: Medicare HMO

## 2022-04-24 ENCOUNTER — Other Ambulatory Visit: Payer: Self-pay | Admitting: Hematology and Oncology

## 2022-04-25 NOTE — Telephone Encounter (Signed)
Refilled per MD OV note 03/21/22: She will continue anastrozole till February 2027

## 2022-04-27 ENCOUNTER — Encounter: Payer: Self-pay | Admitting: Physician Assistant

## 2022-04-27 ENCOUNTER — Ambulatory Visit: Payer: Medicare HMO | Admitting: Physician Assistant

## 2022-04-27 VITALS — BP 114/77 | HR 87 | Resp 18 | Ht 65.0 in

## 2022-04-27 DIAGNOSIS — R413 Other amnesia: Secondary | ICD-10-CM

## 2022-04-27 DIAGNOSIS — Z820 Family history of epilepsy and other diseases of the nervous system: Secondary | ICD-10-CM | POA: Diagnosis not present

## 2022-04-27 NOTE — Progress Notes (Signed)
Assessment/Plan:   Memory Change  Kelly [name] is a very pleasant 81 y.o. RH female  with  a history of hypertension, hyperlipidemia, breast cancer, OSA on CPAP, arthritis, DM2, Vit D deficiency and memory impairment of unknown etiology, seen today in follow up to discuss the MRI of the brain results performed on 04/15/2022. These were personally reviewed, remarkable for orbital mild for age parenchymal volume loss, without lobar predilection or disproportionate hypo-Compal atrophy, and mild to moderate chronic small vessel ischemic changes for age but not significantly changed since 2018.  She is not on antidementia medication at this time    Follow up in 6 months. Awaiting neurocognitive testing to further evaluate cognitive concerns, determining other underlying causes of memory changes such as contribution from sleep, anxiety or depression, as well as for clarity of the diagnosis Recommend good control of cardiovascular risk factors Will entertain starting ACHI given her borderline score and strong family history of AD, pending on Neuropsych evaluation or if memory worsens Mood control as per PCP Continue physical therapy for balance and strength    Subjective:    This patient is accompanied in the office by  who supplements the history.  Previous records as well as any outside records available were reviewed prior to todays visit.  Last MoCA on 03/24/2022 was 25/30.  She is not on antidementia medication.   Any changes in memory since last visit? Denies Tolerating meds?  She is not on antidementia medication at this time   Initial evaluation 04/03/2022 How long did patient have memory difficulties?" I am here because my family has a strong history of dementia". Denies any issues such as difficulty remembering people's names or retaining new information or recent conversations. She does not do crossword puzzles and word finding.  She tries to stay busy, and enjoys playing bridge  frequently, meeting different groups and attending church . repeats oneself? Denies  Disoriented when walking into a room?  Patient denies   Leaving objects in unusual places?  Patient denies   Lives with roommate Ambulates  with difficulty?  She had decreased mobility on the L foot with recent tendinitis, and bilateral shoulder arthritis. She does Chair yoga, walks 5 laps daily, and balance and flexibility classes. Going to try water aerobics  Recent falls?  Patient denies   Any head injuries?  Patient denies   History of seizures?   Patient denies   Wandering behavior?  Patient denies   Patient drives?  Endorsed Any mood changes ?  denies Any depression?:  Patient denies   Hallucinations?  Patient denies   Paranoia?  Patient denies   Patient reports that sleeps well without vivid dreams, REM behavior or sleepwalking.  "When I sleep I sleep "    History of sleep apnea?  Endorsed, uses CPAP x 6 years  Any hygiene concerns?  Patient denies   Independent of bathing and dressing?  Endorsed  Does the patient needs help with medications? Patient in charge  Who is in charge of the finances? Patient  is in charge   Any changes in appetite?  Patient denies   Patient have trouble swallowing? Patient denies   Does the patient cook? Endorsed Any kitchen accidents such as leaving the stove on? Patient denies   Any headaches?  Patient denies   Double vision? Patient denies   Any focal numbness or tingling?  Patient denies   Chronic back pain Patient denies   Unilateral weakness?  Patient denies   Any tremors?  Patient denies   Any history of anosmia?  Patient denies   Any incontinence of urine?  Patient denies   Any bowel dysfunction?   Patient denies   History of heavy alcohol intake?  Patient denies   History of heavy tobacco use?  Patient denies   Family history of dementia?   Both parents had dementia, possibly Alzheimer's, and her sister and brother ( a retired Engineer, drilling in town)  has  been diagnosed with AD. Graduate degree, retired from [employer] and she was a Pharmacist, hospital for 10 year   Pertinent Labs October 2023 A1C 6.6, vitamin D 35.1 LDL 98, normal CBC Vitamin B12 03/24/2022 was 568, and TSH was 1.07  04/15/2022 MRI brain was remarkable for remarkable for mild for age parenchymal volume loss, without lobar predilection or disproportionate hypo-Compal atrophy, and mild to moderate chronic small vessel ischemic changes for age but not significantly changed since 2018.   CURRENT MEDICATIONS:  Outpatient Encounter Medications as of 04/27/2022  Medication Sig   anastrozole (ARIMIDEX) 1 MG tablet TAKE 1 TABLET BY MOUTH EVERY DAY   atorvastatin (LIPITOR) 40 MG tablet TAKE 1 TABLET BY MOUTH EVERY DAY   Calcium Citrate (CITRACAL PO) Take 2 tablets by mouth daily.   cholecalciferol (VITAMIN D3) 25 MCG (1000 UNIT) tablet Take 1,000 Units by mouth daily.   Coenzyme Q10 (COQ10) 200 MG CAPS Take 200 mg by mouth daily.   methocarbamol (ROBAXIN) 500 MG tablet Take 500 mg by mouth 4 (four) times daily. (Patient not taking: Reported on 03/24/2022)   minoxidil (LONITEN) 2.5 MG tablet Take 0.5 tablets (1.25 mg total) by mouth daily.   Multiple Vitamins-Minerals (SPECTRAVITE) TABS Take 1 tablet by mouth daily.   Omega-3 Fatty Acids (FISH OIL) 1200 MG CAPS Take 1,200 mg by mouth daily.   No facility-administered encounter medications on file as of 04/27/2022.       05/13/2018    3:28 PM  MMSE - Mini Mental State Exam  Orientation to time 5  Orientation to Place 5  Registration 3  Attention/ Calculation 5  Recall 3  Language- name 2 objects 2  Language- repeat 1  Language- follow 3 step command 3  Language- read & follow direction 1  Write a sentence 1  Copy design 1  Total score 30      03/24/2022    8:00 AM  Montreal Cognitive Assessment   Visuospatial/ Executive (0/5) 4  Naming (0/3) 2  Attention: Read list of digits (0/2) 2   Attention: Read list of letters (0/1) 1  Attention: Serial 7 subtraction starting at 100 (0/3) 3  Language: Repeat phrase (0/2) 2  Language : Fluency (0/1) 1  Abstraction (0/2) 2  Delayed Recall (0/5) 2  Orientation (0/6) 6  Total 25  Adjusted Score (based on education) 25   Thank you for allowing [place] the opportunity to participate in the care of this nice patient. Please do not hesitate to contact us for any questions or concerns.   Total time spent on today's visit was 52 minutes dedicated to this patient today, preparing to see patient, examining the patient, ordering tests and/or medications and counseling the patient, documenting clinical information in the EHR or other health record, independently interpreting results and communicating results to the patient/family, discussing treatment and goals, answering patient's questions and coordinating care.  Cc:  Hoyt Koch, MD  Sharene Butters 04/27/2022 7:32 AM

## 2022-04-27 NOTE — Patient Instructions (Signed)
It was a pleasure to see you today at our office.   Recommendations:  Follow up in 6  month  Neurocognitive testing    Whom to call:  Memory  decline, memory medications: Call our office [phone]   For psychiatric meds, mood meds: Please have your primary care physician manage these medications.     RECOMMENDATIONS FOR ALL PATIENTS WITH MEMORY PROBLEMS: 1. Continue to exercise (Recommend 30 minutes of walking everyday, or 3 hours every week) 2. Increase social interactions - continue going to [employer] and enjoy social gatherings with friends and family 3. Eat healthy, avoid fried foods and eat more fruits and vegetables 4. Maintain adequate blood pressure, blood sugar, and blood cholesterol level. Reducing the risk of stroke and cardiovascular disease also helps promoting better memory. 5. Avoid stressful situations. Live a simple life and avoid aggravations. Organize your time and prepare for the next day in anticipation. 6. Sleep well, avoid any interruptions of sleep and avoid any distractions in the bedroom that may interfere with adequate sleep quality 7. Avoid sugar, avoid sweets as there is a strong link between excessive sugar intake, diabetes, and cognitive impairment We discussed the Mediterranean diet, which has been shown to help patients reduce the risk of progressive memory disorders and reduces cardiovascular risk. This includes eating fish, eat fruits and green leafy vegetables, nuts like almonds and hazelnuts, walnuts, and also use olive oil. Avoid fast foods and fried foods as much as possible. Avoid sweets and sugar as sugar use has been linked to worsening of memory function.  There is always a concern of gradual progression of memory problems. If this is the case, then we may need to adjust level of care according to patient needs. Support, both to the patient and caregiver, should then be put into place.    FALL PRECAUTIONS: Be cautious when walking. Scan the  area for obstacles that may increase the risk of trips and falls. When getting up in the mornings, sit up at the edge of the bed for a few minutes before getting out of bed. Consider elevating the bed at the head end to avoid drop of blood pressure when getting up. Walk always in a well-lit room (use night lights in the walls). Avoid area rugs or power cords from appliances in the middle of the walkways. Use a walker or a cane if necessary and consider physical therapy for balance exercise. Get your eyesight checked regularly.  FINANCIAL OVERSIGHT: Supervision, especially oversight when making financial decisions or transactions is also recommended.  HOME SAFETY: Consider the safety of the kitchen when operating appliances like stoves, microwave oven, and blender. Consider having supervision and share cooking responsibilities until no longer able to participate in those. Accidents with firearms and other hazards in the house should be identified and addressed as well.   ABILITY TO BE LEFT ALONE: If patient is unable to contact 911 operator, consider using LifeLine, or when the need is there, arrange for someone to stay with patients. Smoking is a fire hazard, consider supervision or cessation. Risk of wandering should be assessed by caregiver and if detected at any point, supervision and safe proof recommendations should be instituted.  MEDICATION SUPERVISION: Inability to self-administer medication needs to be constantly addressed. Implement a mechanism to ensure safe administration of the medications.   DRIVING: Regarding driving, in patients with progressive memory problems, driving will be impaired. We advise to have someone else do the driving if trouble finding directions or if minor accidents  are reported. Independent driving assessment is available to determine safety of driving.   If you are interested in the driving assessment, you can contact the following:  [employer]  in [place]  [place] [phone]  [facility] [phone]

## 2022-05-02 DIAGNOSIS — Z853 Personal history of malignant neoplasm of breast: Secondary | ICD-10-CM | POA: Diagnosis not present

## 2022-05-02 LAB — HM MAMMOGRAPHY

## 2022-05-09 ENCOUNTER — Encounter: Payer: Self-pay | Admitting: Hematology and Oncology

## 2022-05-29 DIAGNOSIS — M2041 Other hammer toe(s) (acquired), right foot: Secondary | ICD-10-CM | POA: Diagnosis not present

## 2022-05-29 DIAGNOSIS — M2021 Hallux rigidus, right foot: Secondary | ICD-10-CM | POA: Diagnosis not present

## 2022-05-30 ENCOUNTER — Other Ambulatory Visit: Payer: Self-pay | Admitting: Hematology and Oncology

## 2022-05-30 DIAGNOSIS — Z170 Estrogen receptor positive status [ER+]: Secondary | ICD-10-CM

## 2022-05-31 DIAGNOSIS — E119 Type 2 diabetes mellitus without complications: Secondary | ICD-10-CM | POA: Diagnosis not present

## 2022-06-20 ENCOUNTER — Encounter: Payer: Medicare HMO | Admitting: Internal Medicine

## 2022-07-03 ENCOUNTER — Encounter: Payer: Self-pay | Admitting: Psychology

## 2022-07-03 ENCOUNTER — Ambulatory Visit: Payer: Medicare HMO | Admitting: Psychology

## 2022-07-03 ENCOUNTER — Ambulatory Visit: Payer: Medicare HMO

## 2022-07-03 DIAGNOSIS — G3184 Mild cognitive impairment, so stated: Secondary | ICD-10-CM | POA: Diagnosis not present

## 2022-07-03 DIAGNOSIS — R4189 Other symptoms and signs involving cognitive functions and awareness: Secondary | ICD-10-CM

## 2022-07-03 DIAGNOSIS — Z820 Family history of epilepsy and other diseases of the nervous system: Secondary | ICD-10-CM | POA: Diagnosis not present

## 2022-07-03 DIAGNOSIS — I6781 Acute cerebrovascular insufficiency: Secondary | ICD-10-CM

## 2022-07-03 HISTORY — DX: Mild cognitive impairment of uncertain or unknown etiology: G31.84

## 2022-07-03 NOTE — Progress Notes (Signed)
   Psychometrician Note   Cognitive testing was administered to Kelly [name] by Cruzita Lederer, B.S. (psychometrist) under the supervision of Dr. Christia Reading, Ph.D., licensed psychologist on 07/03/2022. Kelly [name] did not appear overtly distressed by the testing session per behavioral observation or responses across self-report questionnaires. Rest breaks were offered.    The battery of tests administered was selected by Dr. Christia Reading, Ph.D. with consideration to Kelly [name]'s current level of functioning, the nature of her symptoms, emotional and behavioral responses during interview, level of literacy, observed level of motivation/effort, and the nature of the referral question. This battery was communicated to the psychometrist. Communication between Dr. Christia Reading, Ph.D. and the psychometrist was ongoing throughout the evaluation and Dr. Christia Reading, Ph.D. was immediately accessible at all times. Dr. Christia Reading, Ph.D. provided supervision to the psychometrist on the date of this service to the extent necessary to assure the quality of all services provided.    Kelly [name] will return within approximately 1-2 weeks for an interactive feedback session with Dr. Melvyn Novas at which time her test performances, clinical impressions, and treatment recommendations will be reviewed in detail. Kelly [name] understands she can contact our office should she require our assistance before this time.  A total of 140 minutes of billable time were spent face-to-face with Ms. Salm by the psychometrist. This includes both test administration and scoring time. Billing for these services is reflected in the clinical report generated by Dr. Christia Reading, Ph.D.  This note reflects time spent with the psychometrician and does not include test scores or any clinical interpretations made by Dr. Melvyn Novas. The full report will follow in a separate note.

## 2022-07-03 NOTE — Progress Notes (Signed)
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 billed as one unit [phone] and four additional units [id number]. Psychometrist Cruzita Lederer, B.S., assisted Dr. Melvyn Novas with test administration and scoring procedures. As a separate and discrete service, Dr. Melvyn Novas spent a total of 160 minutes in interpretation and report writing billed as one unit [phone] and two units [id number].

## 2022-07-06 ENCOUNTER — Encounter: Payer: Medicare HMO | Admitting: Internal Medicine

## 2022-07-10 ENCOUNTER — Ambulatory Visit: Payer: Medicare HMO | Admitting: Psychology

## 2022-07-10 DIAGNOSIS — G3184 Mild cognitive impairment, so stated: Secondary | ICD-10-CM | POA: Diagnosis not present

## 2022-07-10 NOTE — Progress Notes (Signed)
   Neuropsychology Feedback Session Kelly [name] [place] Department of Neurology  Reason for Referral:   Kelly [name] is a 82 y.o. right-handed Caucasian female referred by Sharene Butters, PA-C, to characterize her current cognitive functioning and assist with diagnostic clarity and treatment planning in the context of subjective cognitive concerns and a strong family history of Alzheimer's disease.   Feedback:   Kelly [name] completed a comprehensive neuropsychological evaluation on 07/03/2022. Please refer to that encounter for the full report and recommendations. Briefly, results suggested performance variability across both delayed retrieval and recognition/consolidation aspects of verbal and visual memory. Variability was also exhibited across response inhibition; however, other assessed aspects of executive functioning were within normal limits. The etiology for ongoing memory variability is unclear at the present time. Performances across a story-based memory task were consistently strong. However, greater difficulty was exhibited recalling and recognizing a previously drawn complex figure (11% retention), while similar aspects of a list-learning task often scored below expectation. Taken together, there is some concern for rapid forgetting and an evolving memory storage impairment. Neurologically, Kelly [name] expressed outward concerns surrounding Alzheimer's disease, especially given her family history. While this illness in extremely early stages cannot be ruled out, it certainly should not be ruled in and it would appear premature to definitively state that Kelly [name] has this illness at the present time. Continued monitoring will be important to assess the trajectory of current weaknesses over time.   Kelly [name] was accompanied by her brother during the current feedback session. Content of the current session focused on the results of her neuropsychological evaluation. Kelly [name] was given the opportunity to ask questions and her questions were answered. She was encouraged to reach out should additional questions arise. A copy of her report was provided at the conclusion of the visit.      Greater than 31 minutes were spent preparing for, conducting, and documenting the current feedback session with Kelly [name], billed as one unit [id number].

## 2022-07-13 ENCOUNTER — Encounter: Payer: Self-pay | Admitting: Internal Medicine

## 2022-07-13 ENCOUNTER — Ambulatory Visit (INDEPENDENT_AMBULATORY_CARE_PROVIDER_SITE_OTHER): Payer: Medicare HMO | Admitting: Internal Medicine

## 2022-07-13 VITALS — BP 112/80 | HR 72 | Temp 97.7°F | Ht 65.0 in | Wt 198.0 lb

## 2022-07-13 DIAGNOSIS — Z820 Family history of epilepsy and other diseases of the nervous system: Secondary | ICD-10-CM | POA: Diagnosis not present

## 2022-07-13 NOTE — Progress Notes (Signed)
   Subjective:   Patient ID: Kelly [name], female    DOB: 1940/10/09, 82 y.o.   MRN: [id number]  HPI The patient is a 82 YO female coming in for follow up. Moving to [facility] to be near daughter in March.  Review of Systems  Constitutional: Negative.   HENT: Negative.    Eyes: Negative.   Respiratory:  Negative for cough, chest tightness and shortness of breath.   Cardiovascular:  Negative for chest pain, palpitations and leg swelling.  Gastrointestinal:  Negative for abdominal distention, abdominal pain, constipation, diarrhea, nausea and vomiting.  Musculoskeletal: Negative.   Skin: Negative.   Neurological: Negative.   Psychiatric/Behavioral: Negative.      Objective:  Physical Exam Constitutional:      Appearance: She is well-developed.  HENT:     Head: Normocephalic and atraumatic.  Cardiovascular:     Rate and Rhythm: Normal rate and regular rhythm.  Pulmonary:     Effort: Pulmonary effort is normal. No respiratory distress.     Breath sounds: Normal breath sounds. No wheezing or rales.  Abdominal:     General: Bowel sounds are normal. There is no distension.     Palpations: Abdomen is soft.     Tenderness: There is no abdominal tenderness. There is no rebound.  Musculoskeletal:     Cervical back: Normal range of motion.  Skin:    General: Skin is warm and dry.  Neurological:     Mental Status: She is alert and oriented to person, place, and time.     Coordination: Coordination normal.     Vitals:   07/13/22 1100  BP: 112/80  Pulse: 72  Temp: 97.7 F (36.5 C)  TempSrc: Oral  SpO2: 98%  Weight: 198 lb (89.8 kg)  Height: '5\' 5"'$  (1.651 m)    Assessment & Plan:  Visit time 25 minutes in face to face communication with patient and coordination of care, additional 5 minutes spent in record review, coordination or care, ordering tests, communicating/referring to other healthcare professionals, documenting in medical records all on the same day of the visit for  total time 30 minutes spent on the visit.

## 2022-07-14 ENCOUNTER — Encounter: Payer: Self-pay | Admitting: Internal Medicine

## 2022-07-14 NOTE — Assessment & Plan Note (Signed)
Reviewed results of recent neuropsych testing and option for preventative aricept or retesting in 1-2 years and start aricept if any changes. She elects to retest in 1-2 years and start aricept if/when needed. Discussed mechanism of efficacy and possible side effects of aricept in case she changes her mind.

## 2022-07-20 ENCOUNTER — Other Ambulatory Visit: Payer: Self-pay | Admitting: Internal Medicine

## 2022-08-09 ENCOUNTER — Encounter: Payer: Medicare HMO | Admitting: Psychology

## 2022-08-16 ENCOUNTER — Telehealth: Payer: Self-pay | Admitting: Internal Medicine

## 2022-08-16 NOTE — Telephone Encounter (Signed)
Patient needs an order for her CPAP supplies sent to [facility].

## 2022-08-18 NOTE — Telephone Encounter (Signed)
Rx on your desk can fax

## 2022-08-18 NOTE — Telephone Encounter (Signed)
Made a copy and have fax this for the patient.

## 2022-08-28 ENCOUNTER — Encounter: Payer: Self-pay | Admitting: *Deleted

## 2022-08-28 IMAGING — MR MR FOOT*L* W/O CM
4 of 5 series · 23 of 40 positions shown · non-contrast
Comparison: None.

CLINICAL DATA: Foot pain.  Pain for 2 months.

EXAM:
MRI OF THE LEFT FOOT WITHOUT CONTRAST
TECHNIQUE: Multiplanar, multisequence MR imaging of the left foot was
performed. No intravenous contrast was administered.

[Series 5: T1 · coronal · 3.0mm · 0.38mm/px · 6 of 54 slices shown (1 of 2)]
[im 1/54]
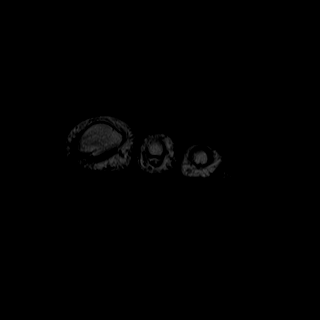
[im 10/54]
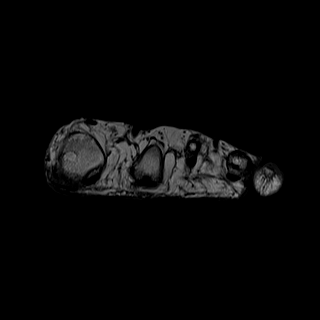
[im 15/54]
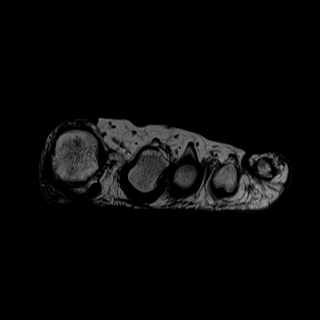
[im 25/54]
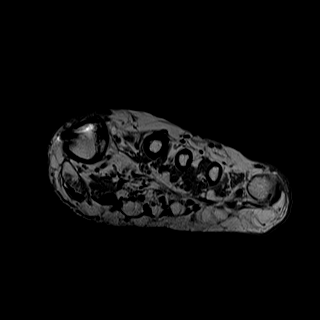
[im 29/54]
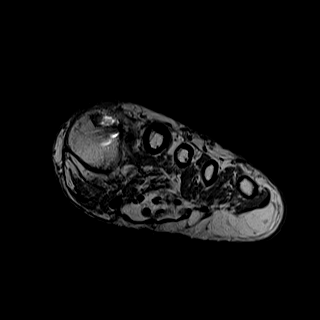
[im 49/54]
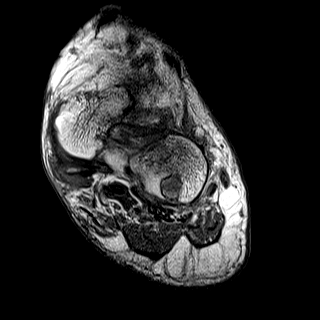

[Series 6: T2 fat-sat · coronal · 3.0mm · 0.23mm/px · 9 of 54 slices shown (1 of 2)]
[im 1/54]
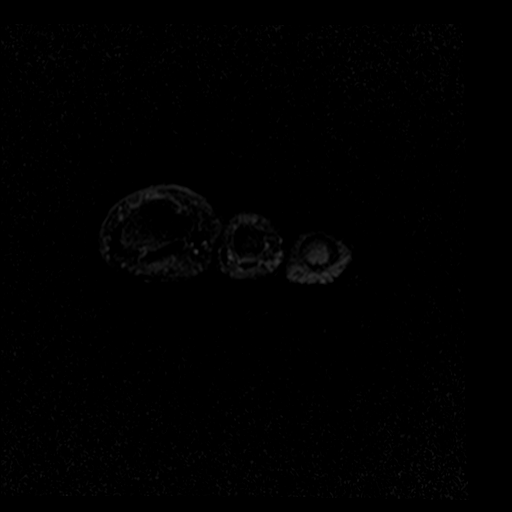
[im 10/54]
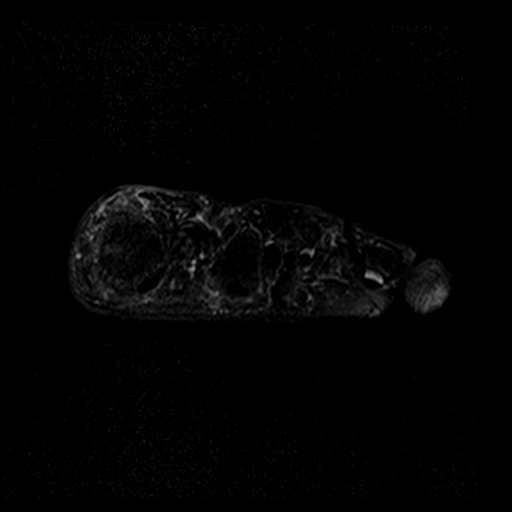
[im 15/54]
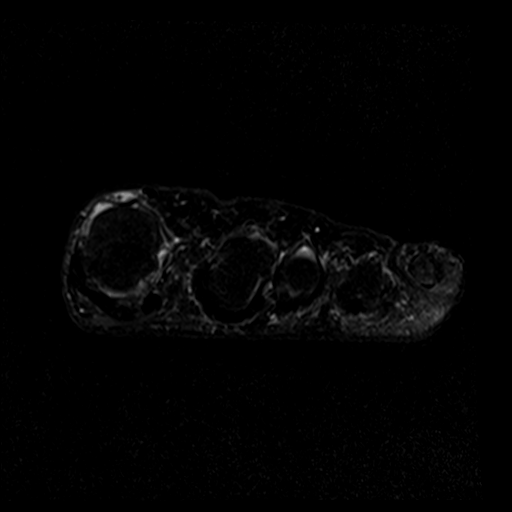
[im 25/54]
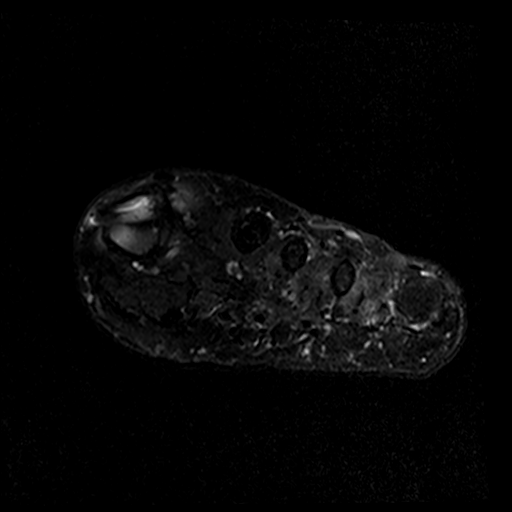
[im 29/54]
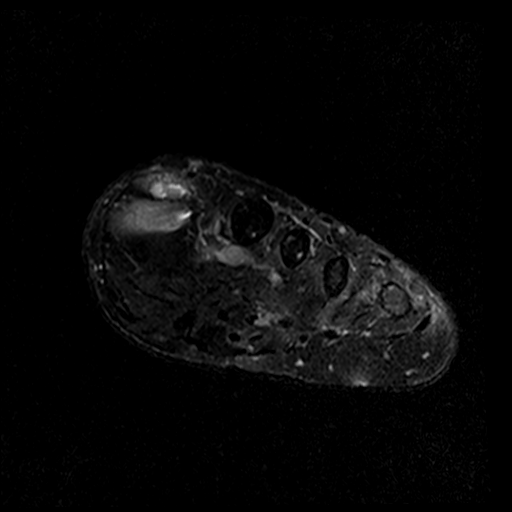
[im 39/54]
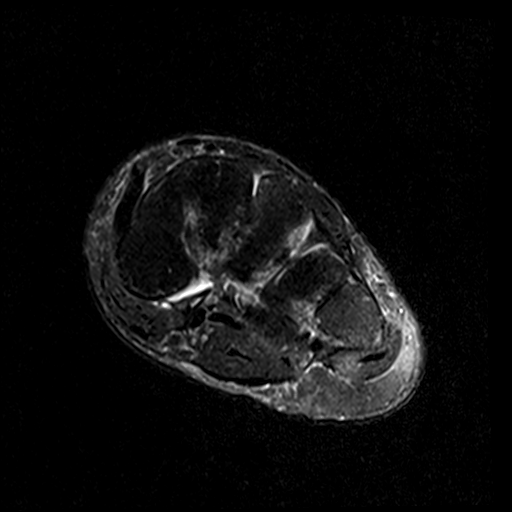
[im 44/54]
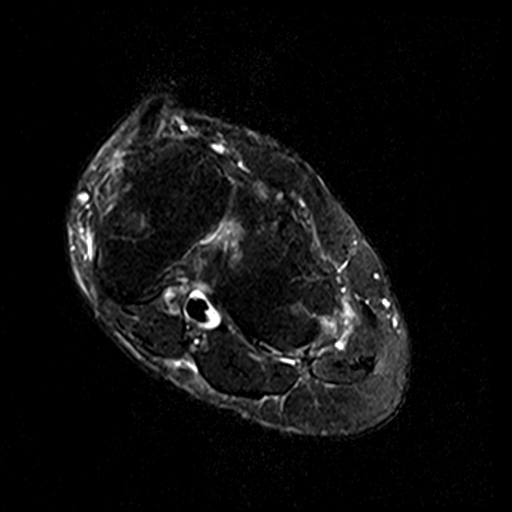
[im 49/54]
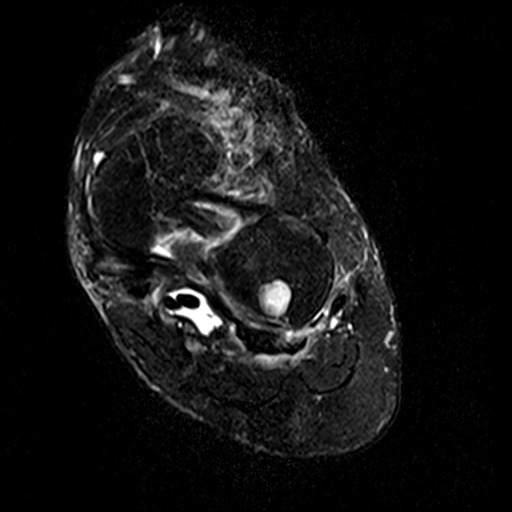
[im 54/54]
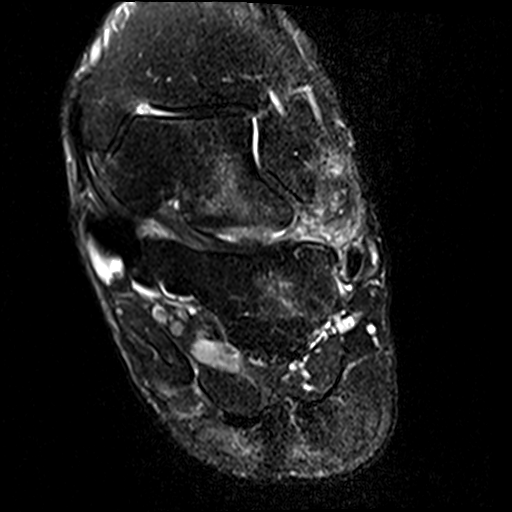

[Series 7: T1 · axial · 3.0mm · 0.35mm/px · z∈[-55,+20]mm · 3 of 26 slices shown (2 of 2)]
[im 1/26]
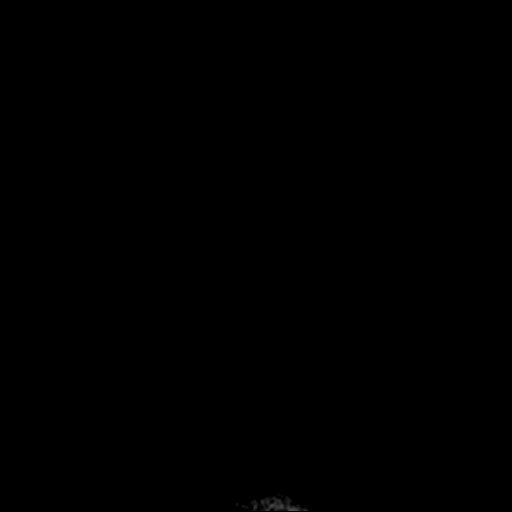
[im 13/26]
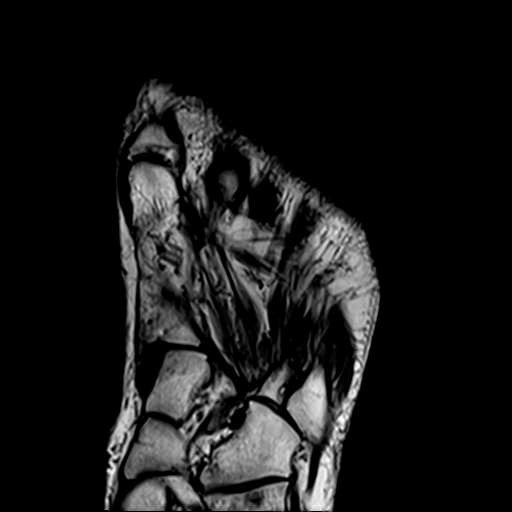
[im 26/26]
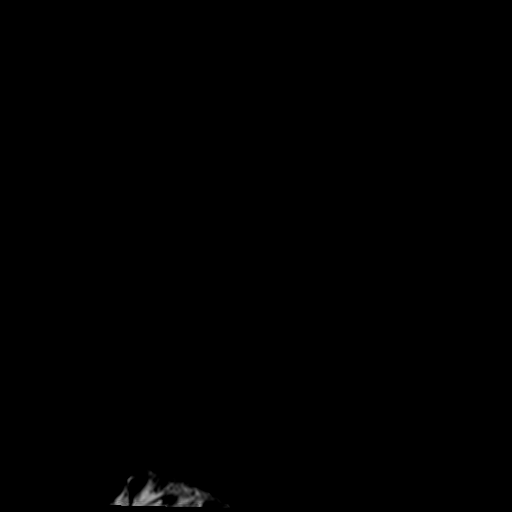

[Series 8: T2 fat-sat · axial · 3.0mm · 0.35mm/px · z∈[-55,+20]mm · 5 of 26 slices shown (2 of 2)]
[im 1/26]
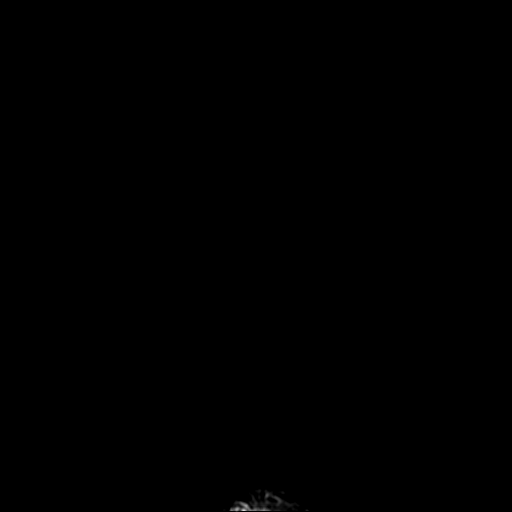
[im 7/26]
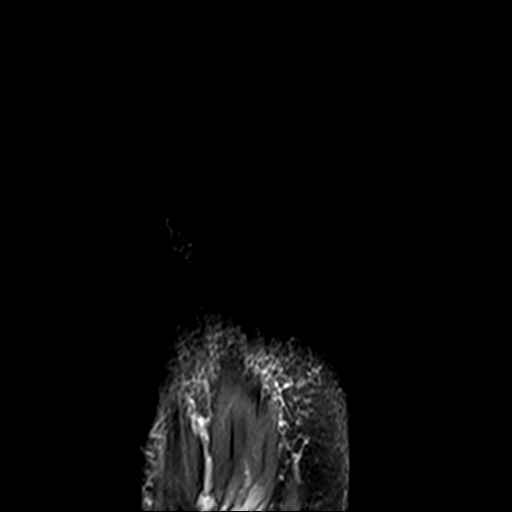
[im 13/26]
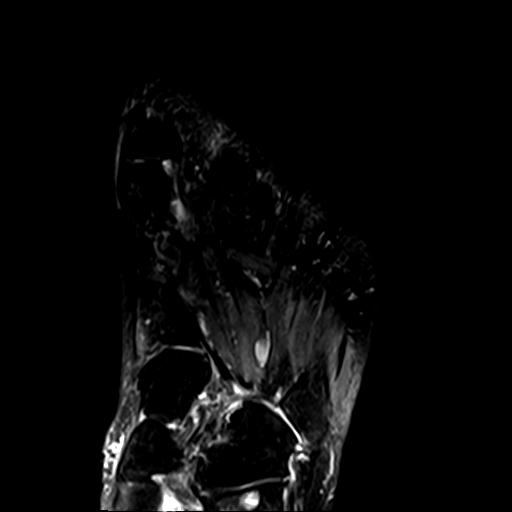
[im 19/26]
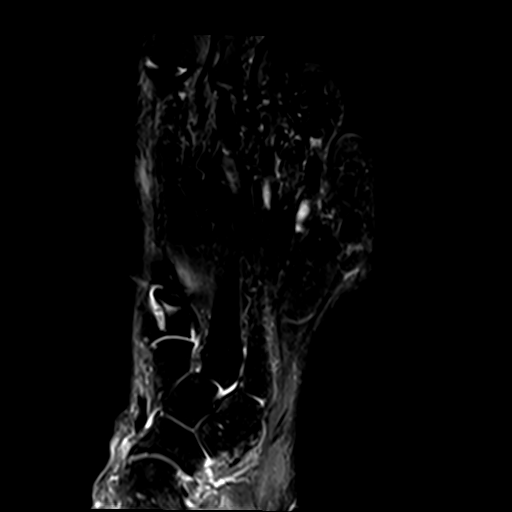
[im 26/26]
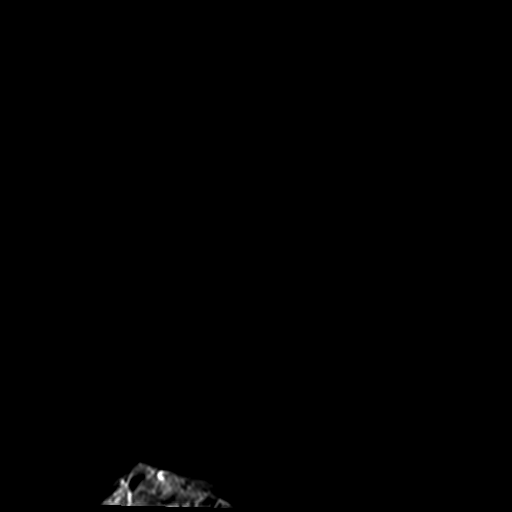

[23 of 40 positions shown; findings below may reference images not displayed]

FINDINGS: Bones/Joint/Cartilage

No acute fracture or dislocation.

Prior healed osteotomy of the first metatarsal. Mild osteoarthritis
of the first MTP joint. Normal alignment. No joint effusion.

Mild osteoarthritis of the calcaneocuboid joint. Subcortical
extra-articular reactive marrow changes involving the lateral talus
and calcaneus as can be seen with posterolateral ankle impingement.

Ligaments

Collateral ligaments are intact.  Lisfranc ligament is intact.

Muscles and Tendons

Flexor, peroneal and extensor compartment tendons are intact.
Muscles are normal.

Soft tissue
No fluid collection or hematoma.  No soft tissue mass.
IMPRESSION: 1.  No acute osseous injury of the left foot.
2. Prior healed osteotomy of the first metatarsal. Mild
osteoarthritis of the first MTP joint.
3. Subcortical extra-articular reactive marrow changes involving the
lateral talus and calcaneus as can be seen with posterolateral ankle
impingement.

## 2022-08-30 ENCOUNTER — Telehealth: Payer: Self-pay

## 2022-08-30 NOTE — Telephone Encounter (Signed)
Contacted Pers[facility] Cox to schedule their annual wellness visit. Appointment made for 09/04/22.  Agnes Lawrence, CMA (AAMA)  [facility]- AWV Program [phone]

## 2022-09-04 ENCOUNTER — Telehealth: Payer: Self-pay

## 2022-09-04 NOTE — Telephone Encounter (Signed)
Patient returned call after 10:00 am. She is in [place] and is on central time. She misunderstood the time since she is in a different time zone. She would like a callback at [phone].

## 2022-09-04 NOTE — Telephone Encounter (Signed)
Called patient lvm to return call, to complete AWV at [phone].  If no return call within 15-20 minutes, patient may reschedule for the next available appointment with NHA or CMA. 

## 2023-01-02 ENCOUNTER — Encounter: Payer: Medicare HMO | Admitting: Psychology

## 2023-01-11 ENCOUNTER — Encounter: Payer: Medicare HMO | Admitting: Psychology

## 2023-02-02 ENCOUNTER — Telehealth: Payer: Self-pay

## 2023-02-02 NOTE — Telephone Encounter (Signed)
Pt wanted to verify it is a telephone appt for October 11th and this RN confirmed that it is. Pt confirmed that she received VM to stop Anastrozole therapy for two weeks.

## 2023-02-02 NOTE — Telephone Encounter (Signed)
This RN called pt and LVM with new appt date and time for 02/23/23 at 1:15 pm. Per Dr, Al Pimple, pt instructed to stop taking Anastrozole therapy for two weeks due to her memory loss problems. Call back number [phone] provided.

## 2023-02-02 NOTE — Telephone Encounter (Signed)
Pt called stating that she is having memory issues on Anastrozole therapy. Pt states her next appt isn't until 11/7 and it is in person. She states that she is moving out of state and is concerned about not being seen before then.  This RN informed her that she would see when the next available time Dr. Al Pimple has for an appointment and that she would call back after discussing her symptoms with Dr. Al Pimple. Pt verbalized understanding.

## 2023-02-05 ENCOUNTER — Telehealth: Payer: Self-pay | Admitting: Internal Medicine

## 2023-02-05 NOTE — Telephone Encounter (Signed)
Contacted Kelly [name] to schedule their annual wellness visit. Patient declined to schedule AWV at this time.Transferred care out of state.  [facility] Care Guide [facility] AWV TEAM Direct Dial: [phone]

## 2023-02-09 ENCOUNTER — Other Ambulatory Visit: Payer: Self-pay | Admitting: Internal Medicine

## 2023-02-23 ENCOUNTER — Inpatient Hospital Stay: Payer: Medicare HMO | Attending: Hematology and Oncology | Admitting: Hematology and Oncology

## 2023-02-23 DIAGNOSIS — C50411 Malignant neoplasm of upper-outer quadrant of right female breast: Secondary | ICD-10-CM | POA: Diagnosis not present

## 2023-02-23 DIAGNOSIS — Z170 Estrogen receptor positive status [ER+]: Secondary | ICD-10-CM | POA: Diagnosis not present

## 2023-02-23 DIAGNOSIS — Z79811 Long term (current) use of aromatase inhibitors: Secondary | ICD-10-CM | POA: Diagnosis not present
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
## 2023-03-13 ENCOUNTER — Other Ambulatory Visit: Payer: Self-pay | Admitting: *Deleted

## 2023-03-22 ENCOUNTER — Inpatient Hospital Stay: Payer: Medicare HMO | Attending: Hematology and Oncology | Admitting: Hematology and Oncology
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
## 2023-03-26 ENCOUNTER — Encounter: Payer: Self-pay | Admitting: Gastroenterology

## 2023-10-04 ENCOUNTER — Other Ambulatory Visit: Payer: Self-pay | Admitting: Internal Medicine

## 2023-11-07 ENCOUNTER — Other Ambulatory Visit: Payer: Self-pay | Admitting: Internal Medicine
